# Patient Record
Sex: Female | Born: 1935 | Race: White | Hispanic: No | Smoking: Never smoker
Health system: Southern US, Community
[De-identification: ages and names within clinical notes are randomized; demographics above are authoritative.]

## PROBLEM LIST (undated history)

## (undated) DIAGNOSIS — K5790 Diverticulosis of intestine, part unspecified, without perforation or abscess without bleeding: Secondary | ICD-10-CM

## (undated) DIAGNOSIS — M1990 Unspecified osteoarthritis, unspecified site: Secondary | ICD-10-CM

## (undated) DIAGNOSIS — K219 Gastro-esophageal reflux disease without esophagitis: Secondary | ICD-10-CM

## (undated) DIAGNOSIS — D649 Anemia, unspecified: Secondary | ICD-10-CM

## (undated) DIAGNOSIS — M069 Rheumatoid arthritis, unspecified: Secondary | ICD-10-CM

## (undated) DIAGNOSIS — Z87442 Personal history of urinary calculi: Secondary | ICD-10-CM

## (undated) DIAGNOSIS — L309 Dermatitis, unspecified: Secondary | ICD-10-CM

## (undated) DIAGNOSIS — F0390 Unspecified dementia without behavioral disturbance: Secondary | ICD-10-CM

## (undated) DIAGNOSIS — C801 Malignant (primary) neoplasm, unspecified: Secondary | ICD-10-CM

## (undated) DIAGNOSIS — I10 Essential (primary) hypertension: Secondary | ICD-10-CM

## (undated) DIAGNOSIS — I2510 Atherosclerotic heart disease of native coronary artery without angina pectoris: Secondary | ICD-10-CM

## (undated) HISTORY — PX: OTHER SURGICAL HISTORY: SHX169

## (undated) HISTORY — PX: TOTAL THYROIDECTOMY: SHX2547

## (undated) HISTORY — PX: CHOLECYSTECTOMY: SHX55

## (undated) HISTORY — DX: Unspecified osteoarthritis, unspecified site: M19.90

## (undated) HISTORY — DX: Atherosclerotic heart disease of native coronary artery without angina pectoris: I25.10

## (undated) SURGERY — ERCP, WITH INTERVENTION IF INDICATED
Anesthesia: Monitor Anesthesia Care

---

## 1998-09-17 HISTORY — PX: OTHER SURGICAL HISTORY: SHX169

## 2003-12-07 ENCOUNTER — Ambulatory Visit: Payer: Self-pay | Admitting: Family Medicine

## 2004-12-22 ENCOUNTER — Ambulatory Visit: Payer: Self-pay | Admitting: Family Medicine

## 2005-12-27 ENCOUNTER — Ambulatory Visit: Payer: Self-pay | Admitting: Family Medicine

## 2007-02-06 ENCOUNTER — Ambulatory Visit: Payer: Self-pay | Admitting: Family Medicine

## 2007-08-06 ENCOUNTER — Ambulatory Visit: Payer: Self-pay | Admitting: Rheumatology

## 2009-06-11 ENCOUNTER — Ambulatory Visit: Payer: Self-pay | Admitting: Internal Medicine

## 2009-06-16 ENCOUNTER — Ambulatory Visit: Payer: Self-pay | Admitting: Internal Medicine

## 2009-07-19 ENCOUNTER — Ambulatory Visit: Payer: Self-pay | Admitting: Gastroenterology

## 2010-05-19 ENCOUNTER — Ambulatory Visit: Payer: Self-pay | Admitting: Surgery

## 2010-05-19 ENCOUNTER — Ambulatory Visit: Payer: Self-pay | Admitting: Physician Assistant

## 2010-05-20 ENCOUNTER — Ambulatory Visit: Payer: Self-pay | Admitting: Surgery

## 2010-08-18 ENCOUNTER — Ambulatory Visit: Payer: Self-pay | Admitting: Internal Medicine

## 2011-03-08 ENCOUNTER — Ambulatory Visit: Payer: Self-pay | Admitting: Otolaryngology

## 2011-03-08 LAB — BASIC METABOLIC PANEL
Anion Gap: 8 (ref 7–16)
BUN: 18 mg/dL (ref 7–18)
EGFR (Non-African Amer.): >60
Glucose: 93 mg/dL (ref 65–99)
Osmolality: 283 (ref 275–301)
Potassium: 3.3 mmol/L — ABNORMAL LOW (ref 3.5–5.1)
Sodium: 141 mmol/L (ref 136–145)

## 2011-03-08 LAB — CBC WITH DIFFERENTIAL/PLATELET
Basophil #: 0 10*3/uL (ref 0.0–0.1)
Basophil %: 0.3 %
Eosinophil %: 0.5 %
HCT: 37.6 % (ref 35.0–47.0)
HGB: 12.5 g/dL (ref 12.0–16.0)
Lymphocyte %: 34.3 %
MCH: 30.7 pg (ref 26.0–34.0)
MCHC: 33.2 g/dL (ref 32.0–36.0)
MCV: 93 fL (ref 80–100)
Monocyte #: 0.5 10*3/uL (ref 0.0–0.7)
Monocyte %: 7.3 %
Neutrophil %: 57.6 %
Platelet: 200 10*3/uL (ref 150–440)
RBC: 4.06 10*6/uL (ref 3.80–5.20)
WBC: 6.8 10*3/uL (ref 3.6–11.0)

## 2011-03-08 LAB — CREATININE, SERUM
EGFR (African American): >60
EGFR (Non-African Amer.): >60

## 2011-03-09 ENCOUNTER — Ambulatory Visit: Payer: Self-pay | Admitting: Oncology

## 2011-03-13 ENCOUNTER — Ambulatory Visit: Payer: Self-pay | Admitting: Oncology

## 2011-03-16 ENCOUNTER — Inpatient Hospital Stay: Payer: Self-pay | Admitting: Otolaryngology

## 2011-03-16 LAB — POTASSIUM: Potassium: 3.9 mmol/L (ref 3.5–5.1)

## 2011-03-17 LAB — BASIC METABOLIC PANEL
Anion Gap: 13 (ref 7–16)
BUN: 15 mg/dL (ref 7–18)
Calcium, Total: 8.1 mg/dL — ABNORMAL LOW (ref 8.5–10.1)
EGFR (African American): >60
EGFR (Non-African Amer.): >60
Glucose: 210 mg/dL — ABNORMAL HIGH (ref 65–99)
Osmolality: 294 (ref 275–301)
Potassium: 3.7 mmol/L (ref 3.5–5.1)
Sodium: 144 mmol/L (ref 136–145)

## 2011-03-17 LAB — CBC WITH DIFFERENTIAL/PLATELET
Basophil #: 0 10*3/uL (ref 0.0–0.1)
Eosinophil #: 0 10*3/uL (ref 0.0–0.7)
Lymphocyte #: 0.5 10*3/uL — ABNORMAL LOW (ref 1.0–3.6)
Lymphocyte %: 4.3 %
MCHC: 33.5 g/dL (ref 32.0–36.0)
MCV: 93 fL (ref 80–100)
Monocyte #: 0.3 10*3/uL (ref 0.0–0.7)
Monocyte %: 2.7 %
Platelet: 152 10*3/uL (ref 150–440)
RDW: 13 % (ref 11.5–14.5)

## 2011-03-18 LAB — BASIC METABOLIC PANEL
BUN: 12 mg/dL (ref 7–18)
Calcium, Total: 8.5 mg/dL (ref 8.5–10.1)
Chloride: 107 mmol/L (ref 98–107)
Creatinine: 0.78 mg/dL (ref 0.60–1.30)
EGFR (African American): >60
EGFR (Non-African Amer.): >60
Glucose: 137 mg/dL — ABNORMAL HIGH (ref 65–99)
Osmolality: 287 (ref 275–301)
Sodium: 143 mmol/L (ref 136–145)

## 2011-03-24 ENCOUNTER — Ambulatory Visit: Payer: Self-pay | Admitting: Oncology

## 2011-04-24 ENCOUNTER — Ambulatory Visit: Payer: Self-pay | Admitting: Oncology

## 2011-04-26 LAB — CBC CANCER CENTER
Eosinophil #: 0.1 x10 3/mm (ref 0.0–0.7)
Eosinophil %: 1.3 %
HCT: 37 % (ref 35.0–47.0)
HGB: 12.6 g/dL (ref 12.0–16.0)
Lymphocyte #: 1.5 x10 3/mm (ref 1.0–3.6)
Lymphocyte %: 19.3 %
MCH: 31 pg (ref 26.0–34.0)
MCHC: 33.9 g/dL (ref 32.0–36.0)
MCV: 91 fL (ref 80–100)
Monocyte #: 0.7 x10 3/mm (ref 0.0–0.7)
Monocyte %: 8.8 %
Neutrophil #: 5.5 x10 3/mm (ref 1.4–6.5)
Neutrophil %: 70.3 %
RBC: 4.06 10*6/uL (ref 3.80–5.20)
RDW: 13.2 % (ref 11.5–14.5)
WBC: 7.8 x10 3/mm (ref 3.6–11.0)

## 2011-05-03 LAB — CBC CANCER CENTER
Basophil #: 0 x10 3/mm (ref 0.0–0.1)
Eosinophil #: 0.1 x10 3/mm (ref 0.0–0.7)
Eosinophil %: 1.7 %
HCT: 36.4 % (ref 35.0–47.0)
Lymphocyte #: 1.1 x10 3/mm (ref 1.0–3.6)
Lymphocyte %: 17.8 %
MCH: 30.8 pg (ref 26.0–34.0)
MCHC: 33.8 g/dL (ref 32.0–36.0)
Monocyte #: 0.6 x10 3/mm (ref 0.2–0.9)
Monocyte %: 10.7 %
Neutrophil %: 69.7 %
RDW: 13.1 % (ref 11.5–14.5)

## 2011-05-10 LAB — CBC CANCER CENTER
Basophil %: 0.7 %
Eosinophil %: 2 %
HCT: 37.7 % (ref 35.0–47.0)
Lymphocyte %: 18 %
MCH: 29.8 pg (ref 26.0–34.0)
Neutrophil %: 71 %
RDW: 12.5 % (ref 11.5–14.5)
WBC: 5.3 x10 3/mm (ref 3.6–11.0)

## 2011-05-17 LAB — CBC CANCER CENTER
Basophil #: 0 x10 3/mm (ref 0.0–0.1)
Basophil %: 0.6 %
Eosinophil %: 2.1 %
HCT: 36.4 % (ref 35.0–47.0)
HGB: 11.9 g/dL — ABNORMAL LOW (ref 12.0–16.0)
Lymphocyte %: 16.9 %
MCH: 29.6 pg (ref 26.0–34.0)
MCHC: 32.7 g/dL (ref 32.0–36.0)
MCV: 90 fL (ref 80–100)
Monocyte #: 0.6 x10 3/mm (ref 0.2–0.9)
Monocyte %: 12 %
Neutrophil #: 3.2 x10 3/mm (ref 1.4–6.5)
Neutrophil %: 68.4 %
Platelet: 186 x10 3/mm (ref 150–440)
RBC: 4.03 10*6/uL (ref 3.80–5.20)
RDW: 12.7 % (ref 11.5–14.5)

## 2011-05-22 LAB — PATHOLOGY REPORT

## 2011-05-24 ENCOUNTER — Ambulatory Visit: Payer: Self-pay | Admitting: Oncology

## 2011-05-24 LAB — CBC CANCER CENTER
Basophil #: 0 x10 3/mm (ref 0.0–0.1)
Basophil %: 0.4 %
Eosinophil #: 0.1 x10 3/mm (ref 0.0–0.7)
HCT: 36.6 % (ref 35.0–47.0)
Lymphocyte #: 0.7 x10 3/mm — ABNORMAL LOW (ref 1.0–3.6)
MCHC: 32.8 g/dL (ref 32.0–36.0)
Monocyte %: 10.3 %
Platelet: 169 x10 3/mm (ref 150–440)
RBC: 4.06 10*6/uL (ref 3.80–5.20)
RDW: 12.4 % (ref 11.5–14.5)
WBC: 5.7 x10 3/mm (ref 3.6–11.0)

## 2011-05-31 LAB — CBC CANCER CENTER
Basophil %: 0.6 %
Eosinophil #: 0.2 x10 3/mm (ref 0.0–0.7)
Eosinophil %: 3.2 %
HGB: 11.7 g/dL — ABNORMAL LOW (ref 12.0–16.0)
Lymphocyte #: 0.7 x10 3/mm — ABNORMAL LOW (ref 1.0–3.6)
MCH: 29.7 pg (ref 26.0–34.0)
Monocyte #: 0.5 x10 3/mm (ref 0.2–0.9)
Monocyte %: 10.7 %
Neutrophil #: 3.7 x10 3/mm (ref 1.4–6.5)
Neutrophil %: 72.7 %
RBC: 3.95 10*6/uL (ref 3.80–5.20)
WBC: 5.1 x10 3/mm (ref 3.6–11.0)

## 2011-06-07 LAB — CBC CANCER CENTER
Basophil #: 0 x10 3/mm (ref 0.0–0.1)
Basophil %: 0.2 %
Eosinophil #: 0.1 x10 3/mm (ref 0.0–0.7)
Eosinophil %: 1.1 %
HGB: 11.9 g/dL — ABNORMAL LOW (ref 12.0–16.0)
Lymphocyte #: 0.6 x10 3/mm — ABNORMAL LOW (ref 1.0–3.6)
MCH: 29.3 pg (ref 26.0–34.0)
MCV: 90 fL (ref 80–100)
Monocyte #: 0.7 x10 3/mm (ref 0.2–0.9)
Neutrophil #: 5.9 x10 3/mm (ref 1.4–6.5)
Neutrophil %: 80.9 %
Platelet: 198 x10 3/mm (ref 150–440)
RDW: 12.8 % (ref 11.5–14.5)

## 2011-06-15 ENCOUNTER — Ambulatory Visit: Payer: Self-pay | Admitting: Otolaryngology

## 2011-06-15 DIAGNOSIS — I10 Essential (primary) hypertension: Secondary | ICD-10-CM

## 2011-06-15 LAB — POTASSIUM: Potassium: 3.7 mmol/L (ref 3.5–5.1)

## 2011-06-22 ENCOUNTER — Ambulatory Visit: Payer: Self-pay | Admitting: Otolaryngology

## 2011-06-23 LAB — CALCIUM: Calcium, Total: 8.2 mg/dL — ABNORMAL LOW (ref 8.5–10.1)

## 2011-06-24 ENCOUNTER — Ambulatory Visit: Payer: Self-pay | Admitting: Oncology

## 2011-06-26 LAB — PATHOLOGY REPORT

## 2011-07-03 ENCOUNTER — Encounter: Payer: Self-pay | Admitting: Otolaryngology

## 2011-07-10 ENCOUNTER — Ambulatory Visit: Payer: Self-pay | Admitting: Oncology

## 2011-07-10 LAB — COMPREHENSIVE METABOLIC PANEL
Albumin: 3.5 g/dL (ref 3.4–5.0)
Alkaline Phosphatase: 109 U/L (ref 50–136)
Anion Gap: 7 (ref 7–16)
BUN: 20 mg/dL — ABNORMAL HIGH (ref 7–18)
Bilirubin,Total: 0.7 mg/dL (ref 0.2–1.0)
Co2: 31 mmol/L (ref 21–32)
Creatinine: 0.95 mg/dL (ref 0.60–1.30)
EGFR (African American): >60
Osmolality: 282 (ref 275–301)
Potassium: 3.8 mmol/L (ref 3.5–5.1)
SGOT(AST): 25 U/L (ref 15–37)
Sodium: 140 mmol/L (ref 136–145)
Total Protein: 7.9 g/dL (ref 6.4–8.2)

## 2011-07-10 LAB — CBC CANCER CENTER
Eosinophil #: 0.4 x10 3/mm (ref 0.0–0.7)
Eosinophil %: 6.8 %
HCT: 39.1 % (ref 35.0–47.0)
HGB: 12.8 g/dL (ref 12.0–16.0)
MCHC: 32.8 g/dL (ref 32.0–36.0)
MCV: 88 fL (ref 80–100)
Monocyte #: 0.4 x10 3/mm (ref 0.2–0.9)
Neutrophil #: 4 x10 3/mm (ref 1.4–6.5)
Neutrophil %: 76 %
RBC: 4.42 10*6/uL (ref 3.80–5.20)

## 2011-07-24 ENCOUNTER — Ambulatory Visit: Payer: Self-pay | Admitting: Oncology

## 2011-08-02 ENCOUNTER — Encounter: Payer: Self-pay | Admitting: Otolaryngology

## 2011-08-17 ENCOUNTER — Ambulatory Visit: Payer: Self-pay | Admitting: Oncology

## 2011-08-17 LAB — TSH: Thyroid Stimulating Horm: 12.7 u[IU]/mL — ABNORMAL HIGH

## 2011-08-24 ENCOUNTER — Ambulatory Visit: Payer: Self-pay | Admitting: Oncology

## 2011-09-05 ENCOUNTER — Ambulatory Visit: Payer: Self-pay | Admitting: Internal Medicine

## 2011-09-12 LAB — T4, FREE: Free Thyroxine: 1.94 ng/dL — ABNORMAL HIGH (ref 0.76–1.46)

## 2011-09-12 LAB — TSH: Thyroid Stimulating Horm: 0.042 u[IU]/mL — ABNORMAL LOW

## 2011-09-24 ENCOUNTER — Ambulatory Visit: Payer: Self-pay | Admitting: Oncology

## 2011-10-09 LAB — TSH: Thyroid Stimulating Horm: 0.303 u[IU]/mL — ABNORMAL LOW

## 2011-10-09 LAB — T4, FREE: Free Thyroxine: 0.91 ng/dL (ref 0.76–1.46)

## 2011-10-13 LAB — CBC CANCER CENTER
Basophil %: 0.4 %
Eosinophil #: 0.1 x10 3/mm (ref 0.0–0.7)
Eosinophil %: 2.3 %
HCT: 37.4 % (ref 35.0–47.0)
HGB: 12.6 g/dL (ref 12.0–16.0)
Lymphocyte #: 0.7 x10 3/mm — ABNORMAL LOW (ref 1.0–3.6)
Lymphocyte %: 10.2 %
MCH: 30.4 pg (ref 26.0–34.0)
MCHC: 33.7 g/dL (ref 32.0–36.0)
MCV: 90 fL (ref 80–100)
Monocyte #: 0.6 x10 3/mm (ref 0.2–0.9)
Neutrophil %: 77.7 %
RBC: 4.14 10*6/uL (ref 3.80–5.20)
WBC: 6.5 x10 3/mm (ref 3.6–11.0)

## 2011-10-24 ENCOUNTER — Ambulatory Visit: Payer: Self-pay | Admitting: Oncology

## 2011-11-24 ENCOUNTER — Ambulatory Visit: Payer: Self-pay | Admitting: Oncology

## 2011-12-24 ENCOUNTER — Ambulatory Visit: Payer: Self-pay | Admitting: Oncology

## 2012-02-24 ENCOUNTER — Ambulatory Visit: Payer: Self-pay | Admitting: Oncology

## 2012-03-11 LAB — TSH: Thyroid Stimulating Horm: 0.033 u[IU]/mL — ABNORMAL LOW

## 2012-03-23 ENCOUNTER — Ambulatory Visit: Payer: Self-pay | Admitting: Oncology

## 2012-04-23 ENCOUNTER — Ambulatory Visit: Payer: Self-pay | Admitting: Oncology

## 2012-05-22 LAB — CREATININE, SERUM
EGFR (African American): >60
EGFR (Non-African Amer.): >60

## 2012-05-23 ENCOUNTER — Ambulatory Visit: Payer: Self-pay | Admitting: Oncology

## 2012-06-03 LAB — TSH: Thyroid Stimulating Horm: 0.026 u[IU]/mL — ABNORMAL LOW

## 2012-06-23 ENCOUNTER — Ambulatory Visit: Payer: Self-pay | Admitting: Oncology

## 2012-08-24 IMAGING — CT CT SIM MISC
1 series · 12 of 14 positions shown, 15 images · non-contrast
Comparison: none

[Series 3: — · axial · 1.16mm/px · z∈[-770,-398]mm · 12 of 148 slices shown, 15 images]
[im 12/148  soft-tissue]
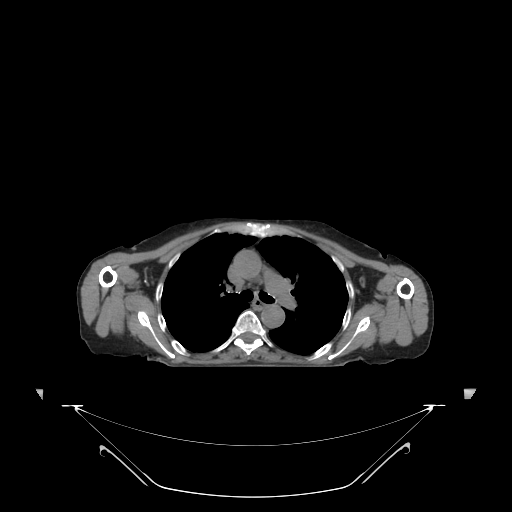
[im 12/148  bone]
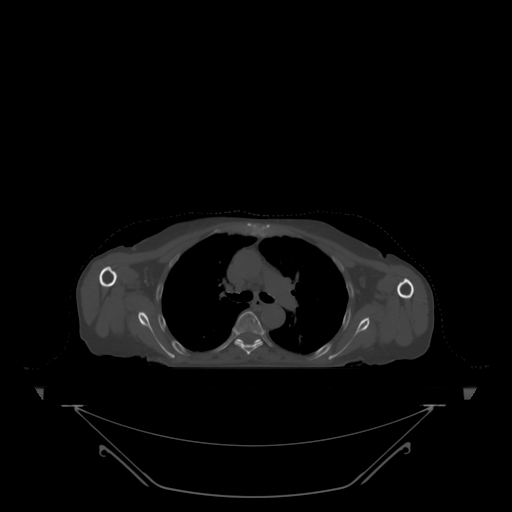
[im 23/148  bone]
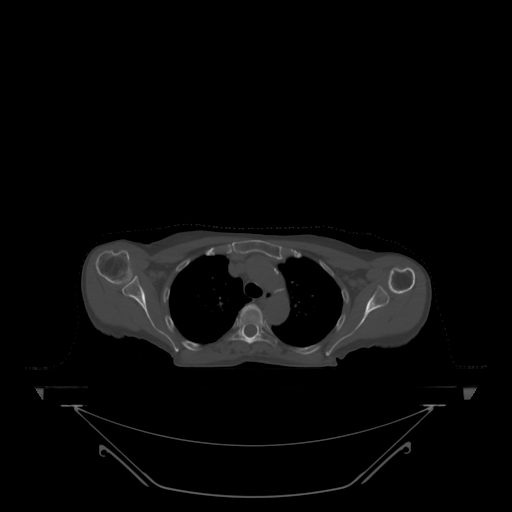
[im 34/148  bone]
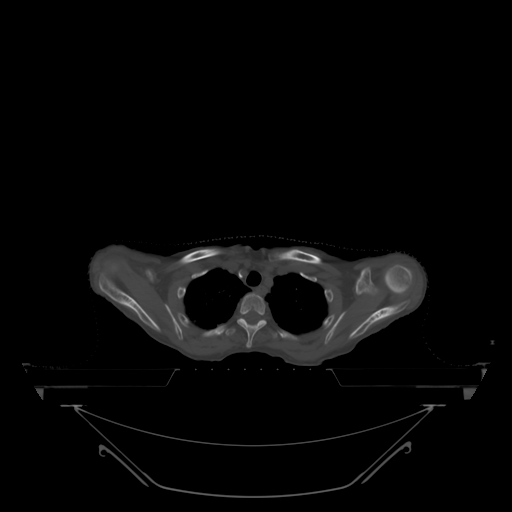
[im 46/148  bone]
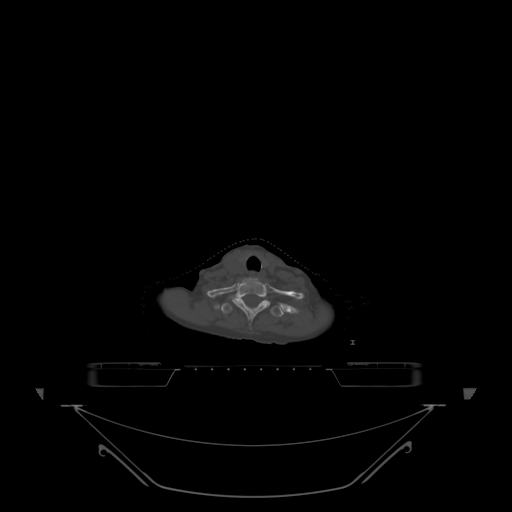
[im 57/148  soft-tissue]
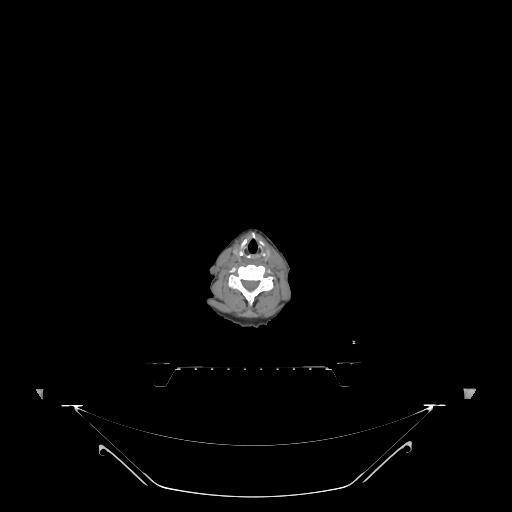
[im 57/148  bone]
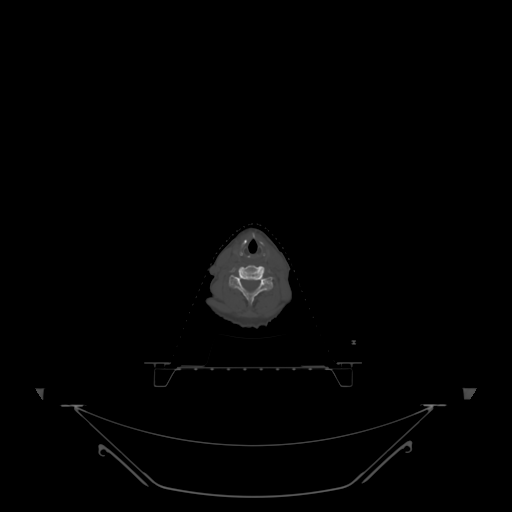
[im 68/148  bone]
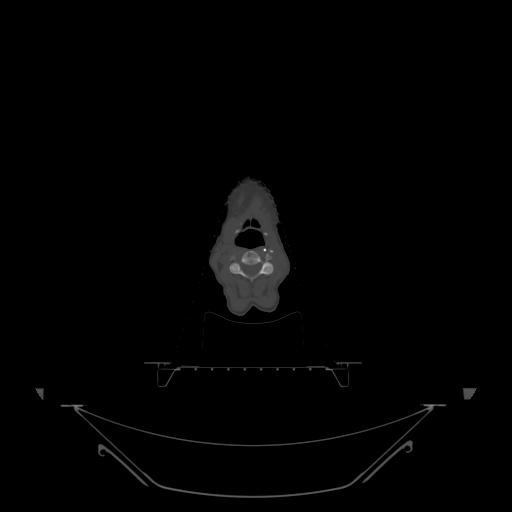
[im 80/148  bone]
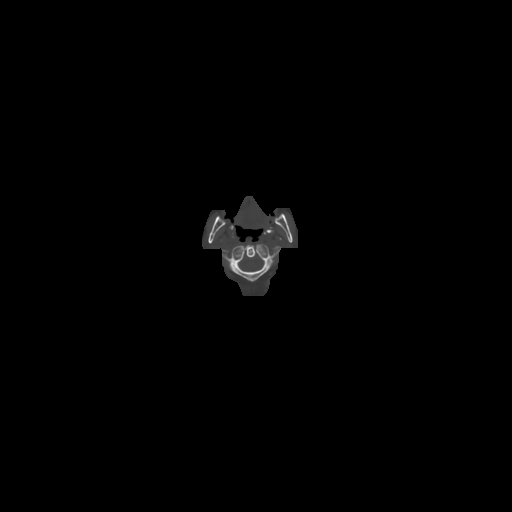
[im 91/148  bone]
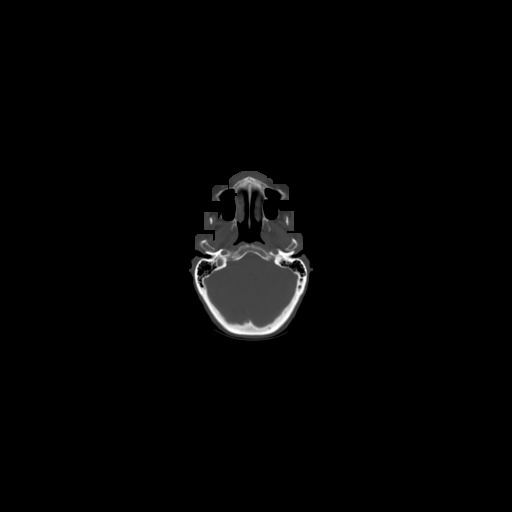
[im 102/148  soft-tissue]
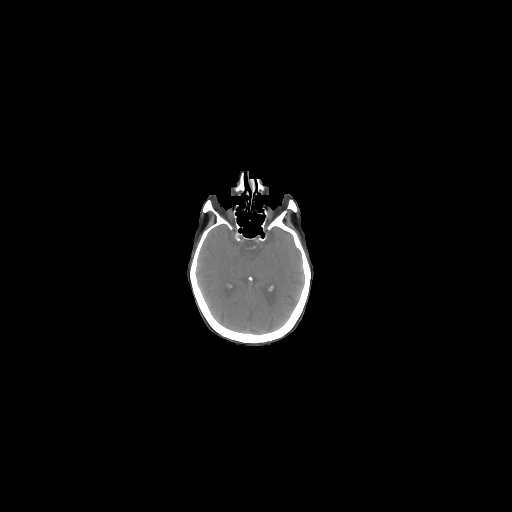
[im 102/148  bone]
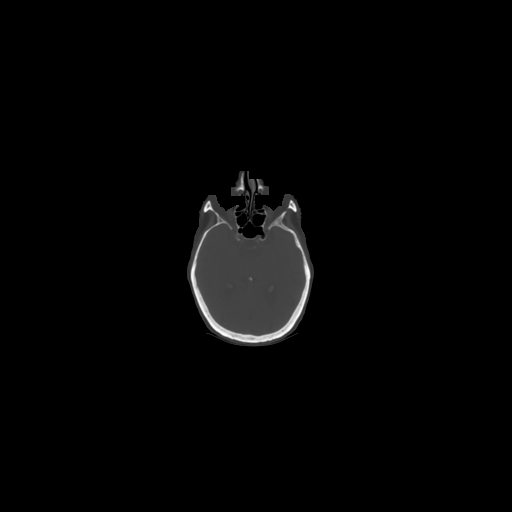
[im 114/148  bone]
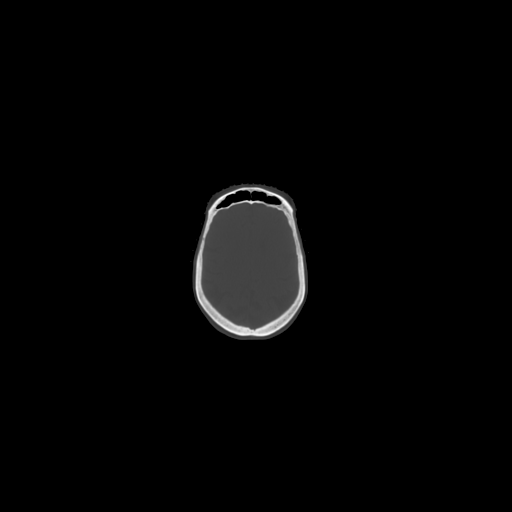
[im 125/148  bone]
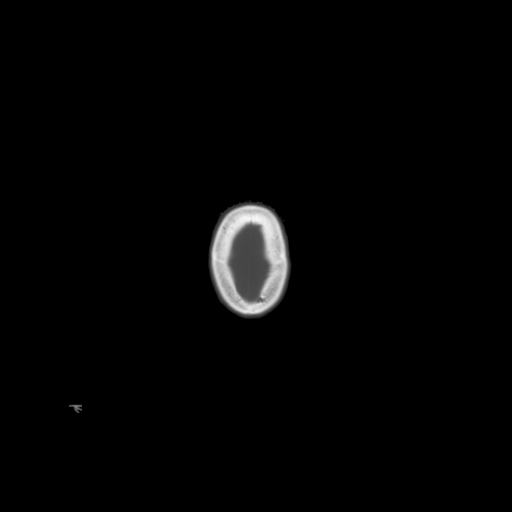
[im 136/148  bone]
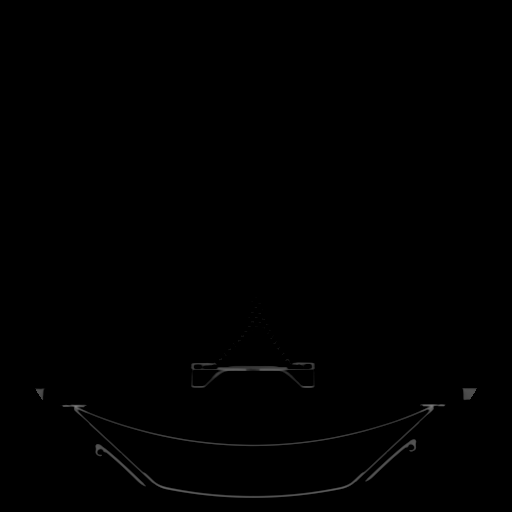

[12 of 14 positions shown; findings below may reference images not displayed]

IMAGES IMPORTED FROM THE [employer] WORKFLOW SYSTEM
NO DICTATION FOR STUDY

## 2012-08-30 ENCOUNTER — Ambulatory Visit: Payer: Self-pay | Admitting: Oncology

## 2012-09-02 LAB — TSH: Thyroid Stimulating Horm: 0.056 u[IU]/mL — ABNORMAL LOW

## 2012-09-25 ENCOUNTER — Ambulatory Visit: Payer: Self-pay | Admitting: Internal Medicine

## 2012-09-26 ENCOUNTER — Ambulatory Visit: Payer: Self-pay | Admitting: Oncology

## 2012-10-04 ENCOUNTER — Ambulatory Visit: Payer: Self-pay | Admitting: Internal Medicine

## 2012-10-09 ENCOUNTER — Ambulatory Visit: Payer: Self-pay | Admitting: Oncology

## 2012-10-23 ENCOUNTER — Ambulatory Visit: Payer: Self-pay | Admitting: Oncology

## 2012-12-02 ENCOUNTER — Ambulatory Visit: Payer: Self-pay | Admitting: Oncology

## 2012-12-02 LAB — CREATININE, SERUM
Creatinine: 0.93 mg/dL (ref 0.60–1.30)
EGFR (African American): >60
EGFR (Non-African Amer.): 57 — ABNORMAL LOW

## 2012-12-02 LAB — TSH: Thyroid Stimulating Horm: 0.146 u[IU]/mL — ABNORMAL LOW

## 2012-12-23 ENCOUNTER — Ambulatory Visit: Payer: Self-pay | Admitting: Oncology

## 2013-02-23 IMAGING — US US EXTREM UP VENOUS*R*
1 series · 14 of 24 positions shown · non-contrast
Comparison: none

REASON FOR EXAM: call report [id number] Eval for DVT  swelling pain
COMMENTS:

[Series 1: us extrem up venous*right* · 0.07mm/px · 14 of 49 slices shown]
[im 1/49]
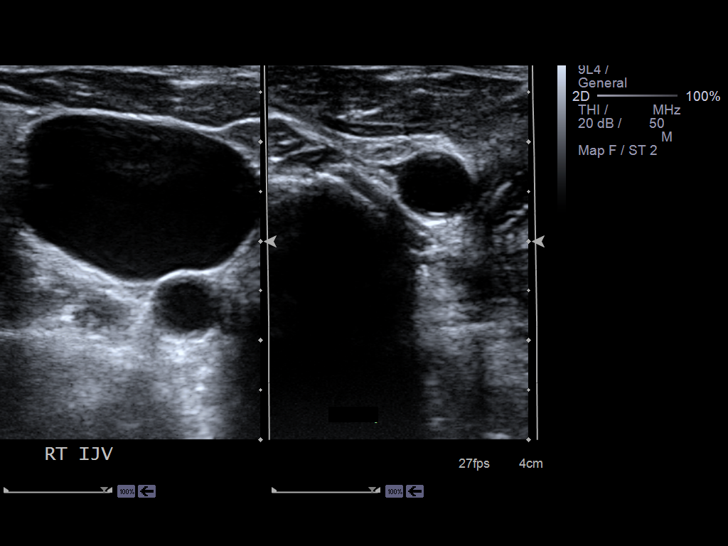
[im 5/49]
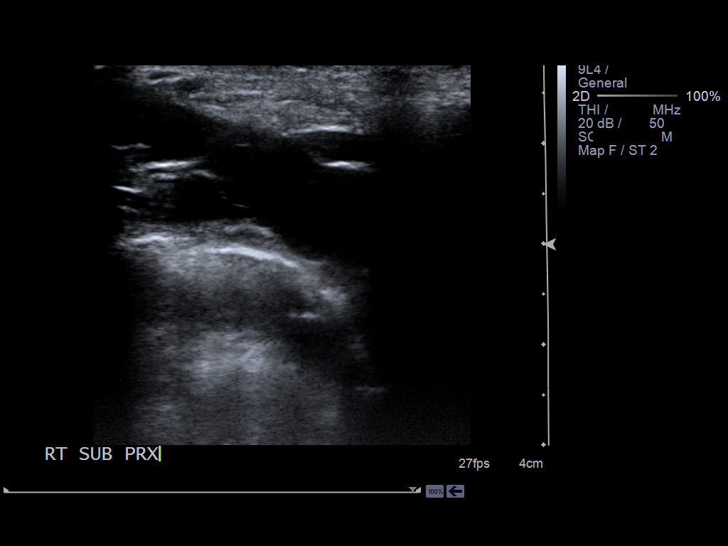
[im 9/49]
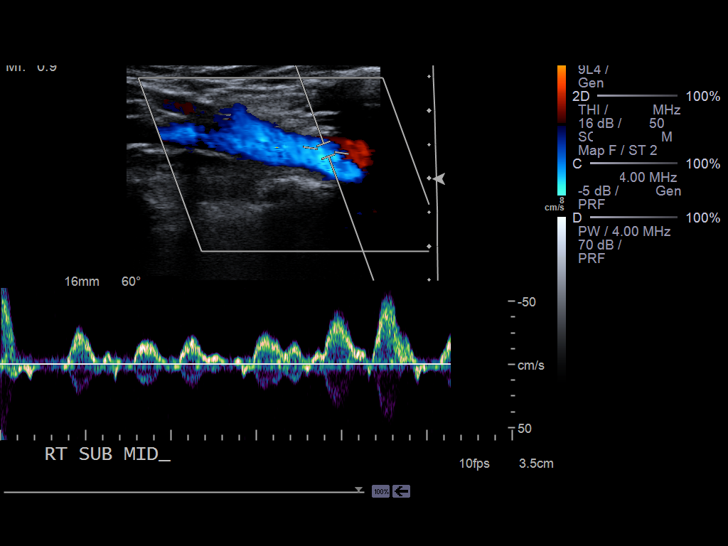
[im 13/49]
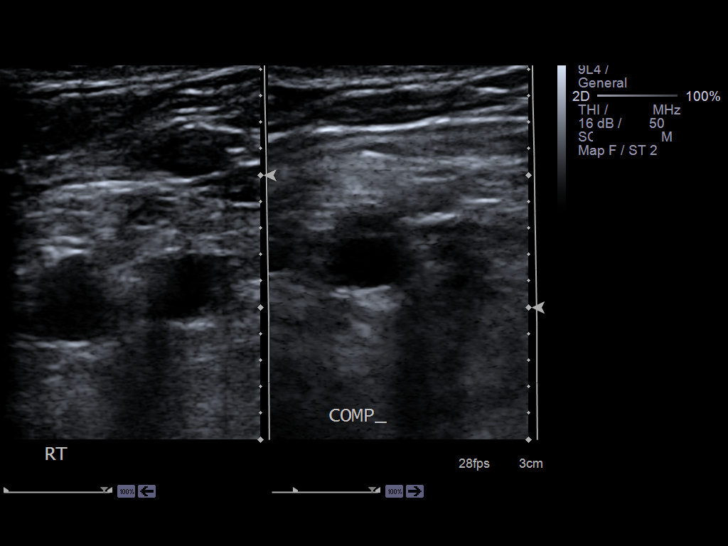
[im 15/49]
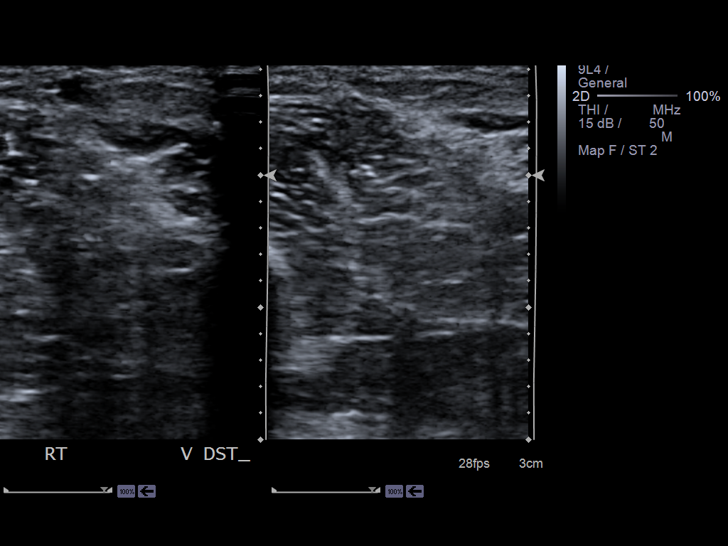
[im 19/49]
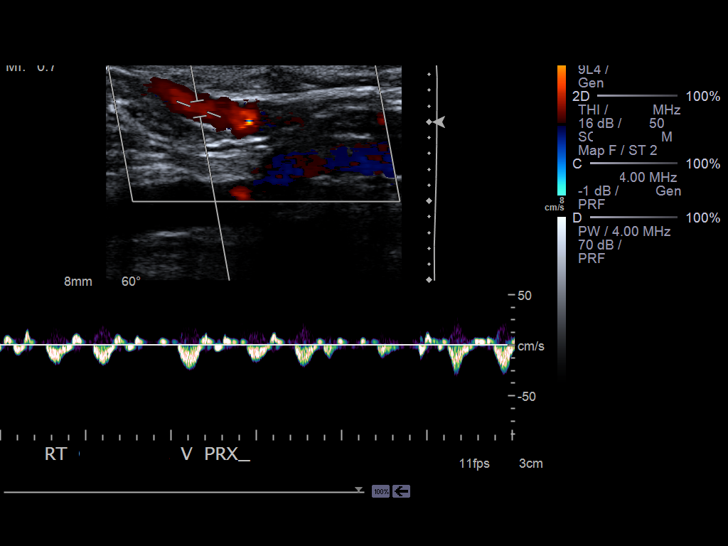
[im 23/49]
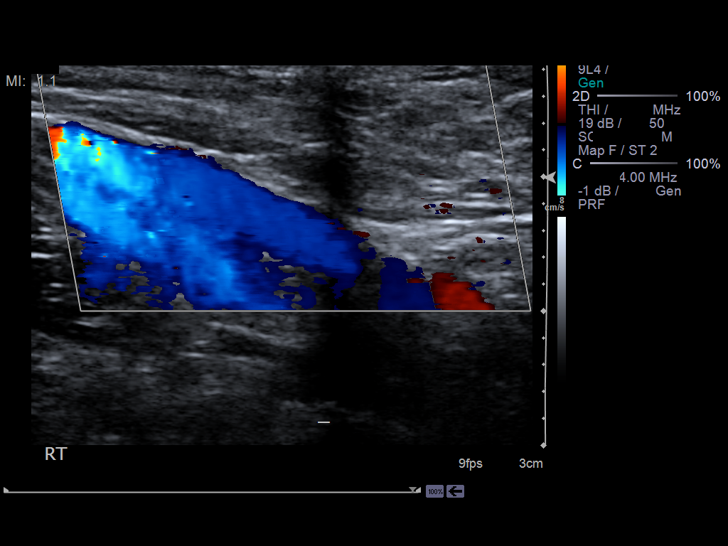
[im 26/49]
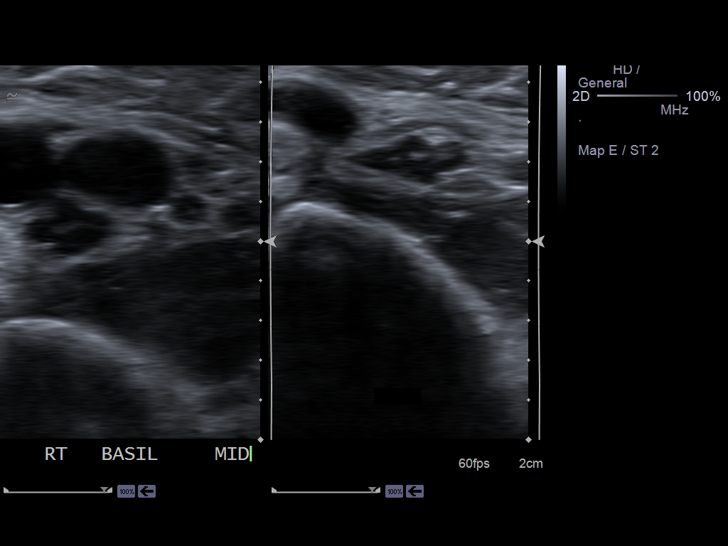
[im 30/49]
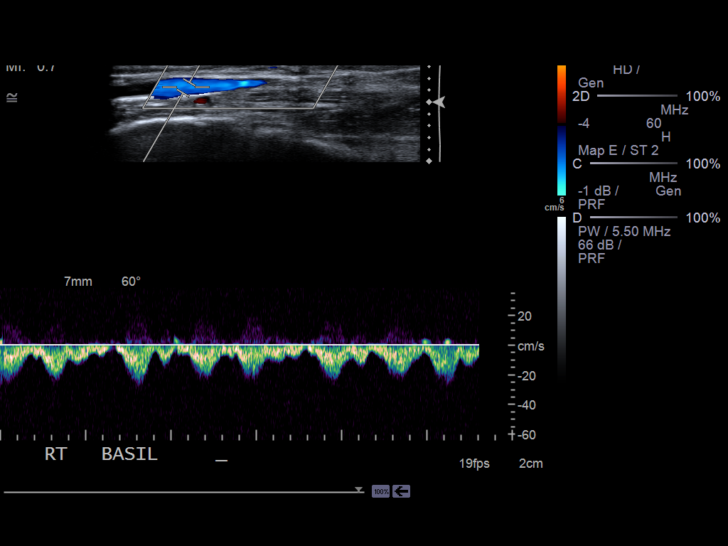
[im 34/49]
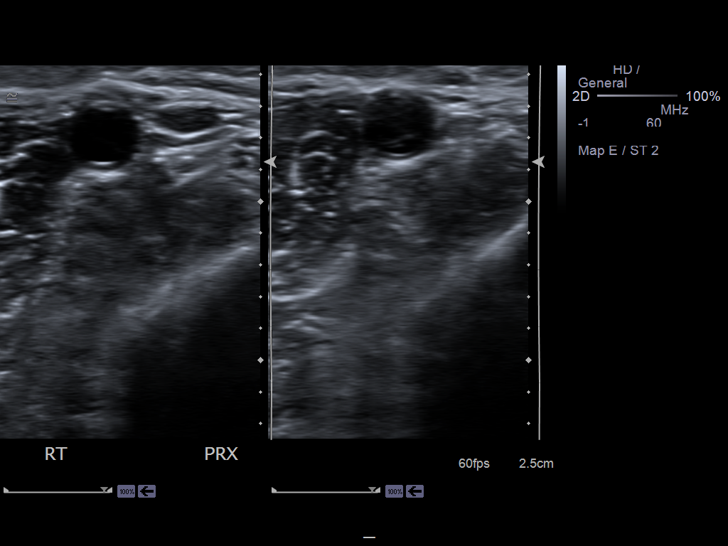
[im 38/49]
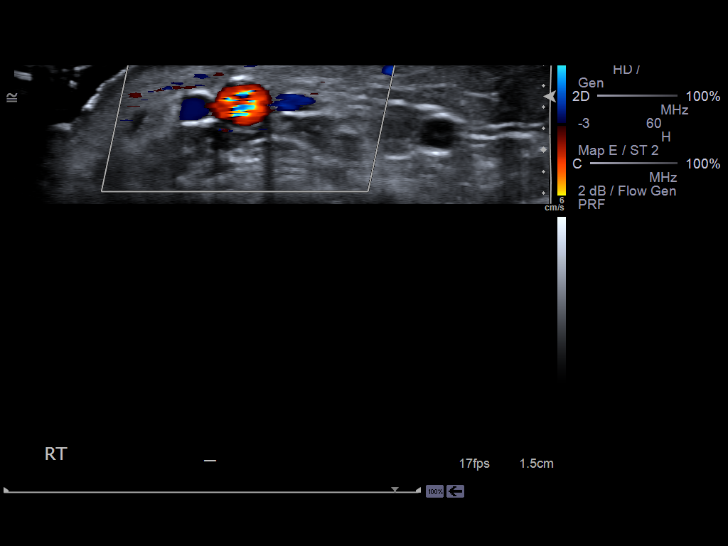
[im 40/49]
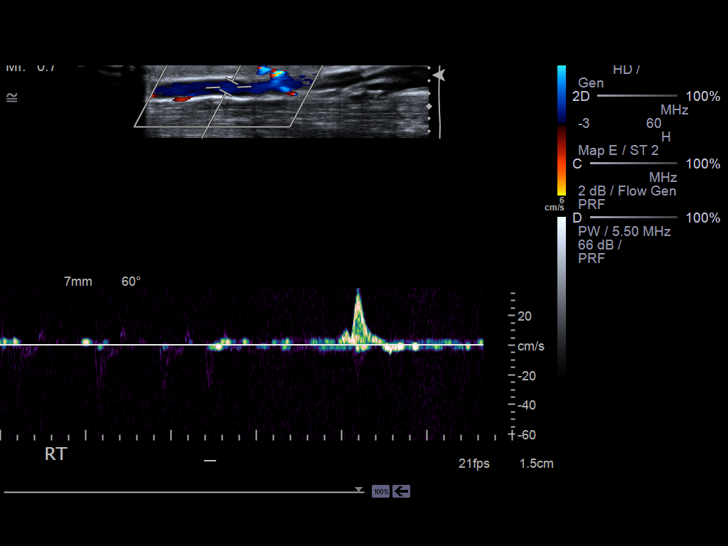
[im 44/49]
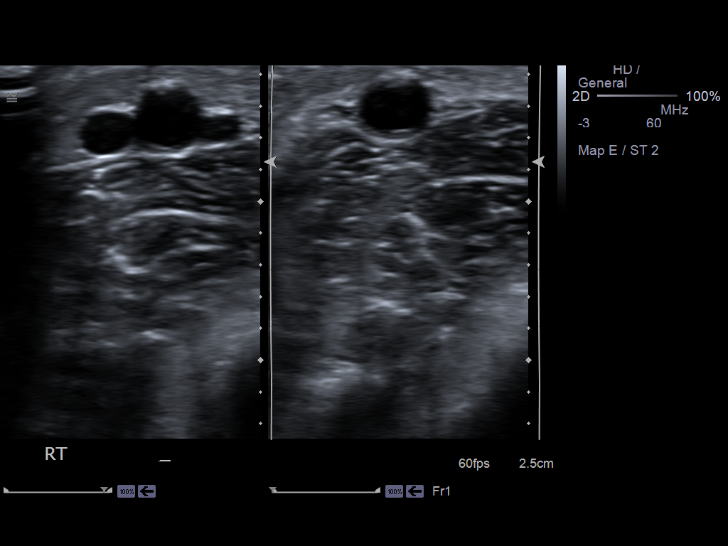
[im 49/49]
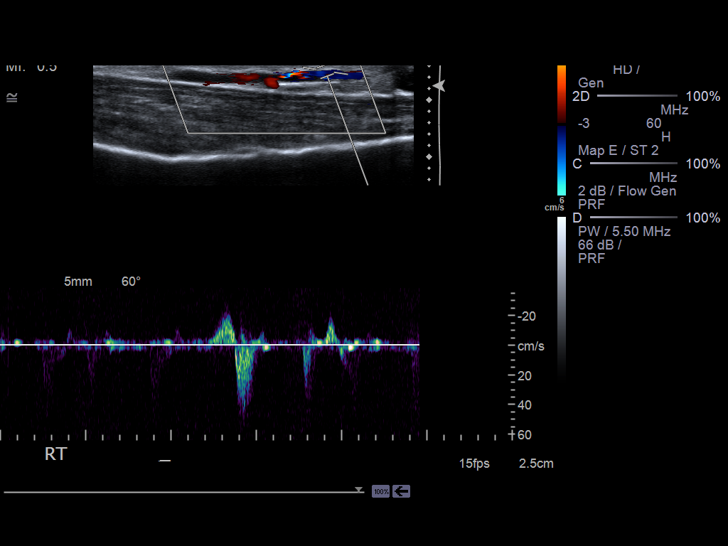

[14 of 24 positions shown; findings below may reference images not displayed]

PROCEDURE:     US  - US DOPPLER UP EXTR RIGHT  - October 13, 2011  [DATE]

RESULT:     Duplex ultrasound was performed of the deep venous system of the
right upper extremity. The vessels from the antecubital vein to the
subclavian vein are normally compressible and the waveform patterns are
normal. No abnormal intraluminal echoes are demonstrated. The jugular vein
where visualized appears normal.
IMPRESSION: There is no evidence of deep venous thrombosis in the veins
of the right upper extremity.

[REDACTED]

## 2013-04-04 ENCOUNTER — Ambulatory Visit: Payer: Self-pay | Admitting: Internal Medicine

## 2013-05-02 IMAGING — CT CT NECK WITH CONTRAST
2 series · 10 of 14 positions shown, 12 images · IV contrast (agent unspecified)
Comparison: none

REASON FOR EXAM: LABS 1ST restaging head neck CA
COMMENTS:

PROCEDURE:     KCT - KCT NECK WITH CONTRAST  - December 20, 2011 [DATE]
RESULT:
TECHNIQUE: CT of the neck is performed with 75 ml of Ksovue-YOO iodinated
intravenous contrast with images reconstructed at 3.0 mm slice thickness in
the axial plane.
Comparison is made to images from a study dated 08 March, 2011.

[Series 2: neck 3.0 3 · axial · 0.44mm/px · z∈[-350,-101]mm · 8 of 106 slices shown, 10 images]
[im 12/106  soft-tissue]
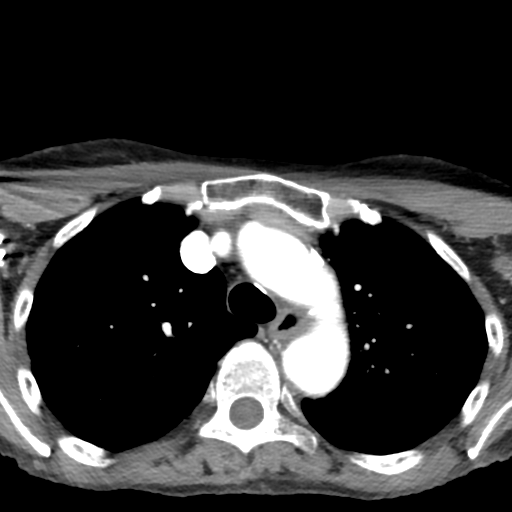
[im 12/106  bone]
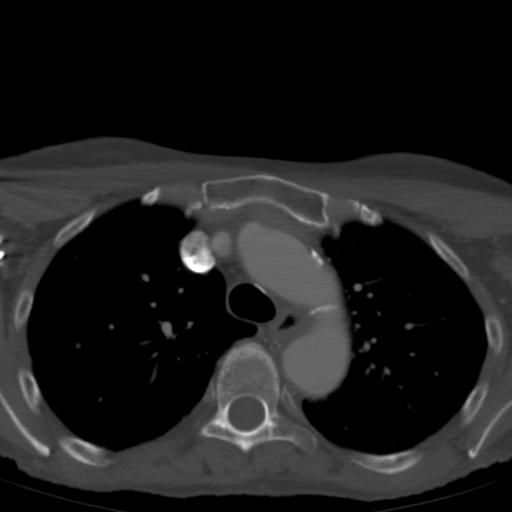
[im 24/106  bone]
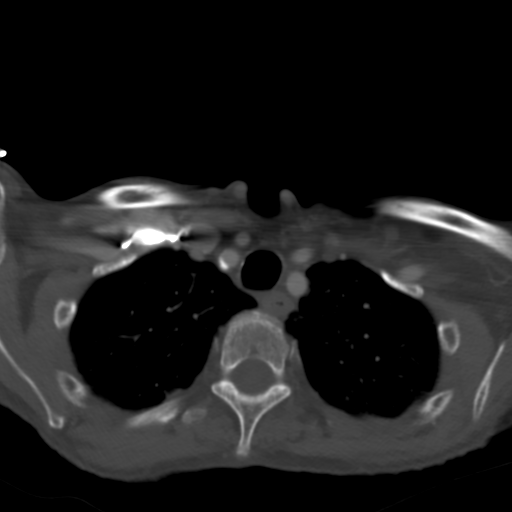
[im 36/106  bone]
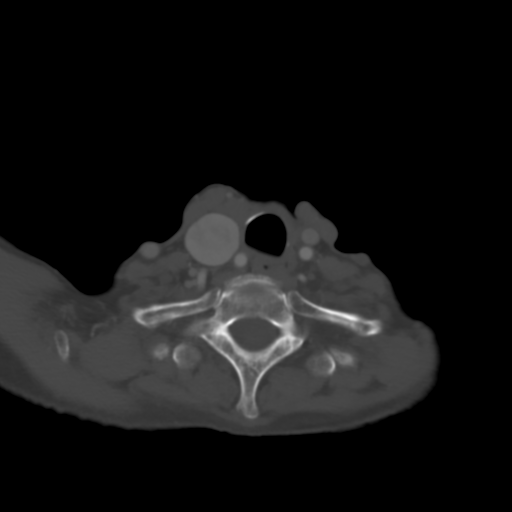
[im 47/106  bone]
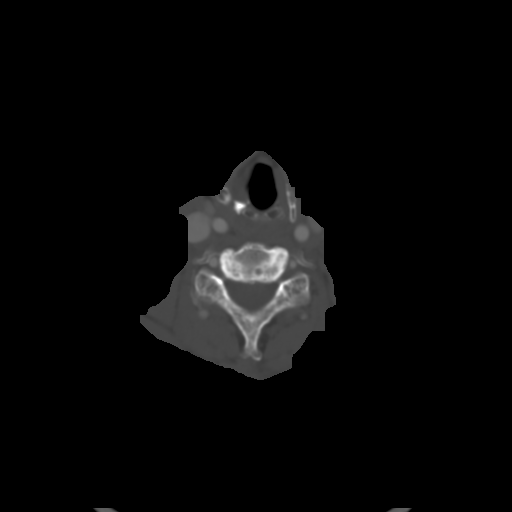
[im 59/106  soft-tissue]
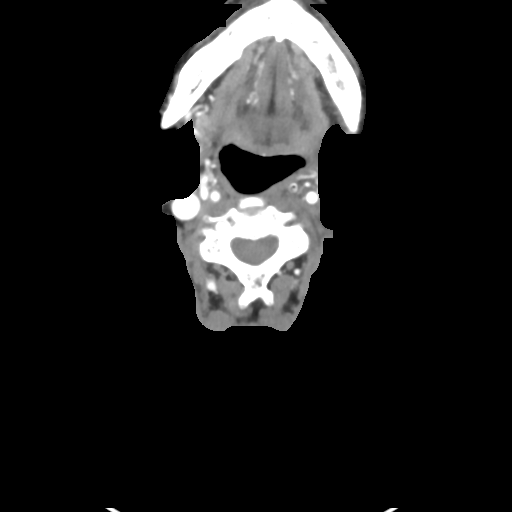
[im 59/106  bone]
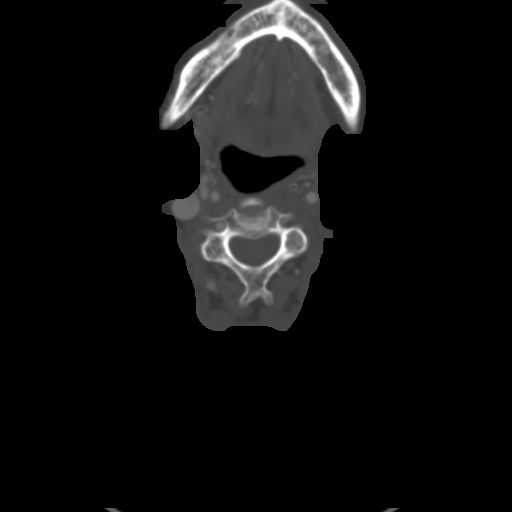
[im 71/106  bone]
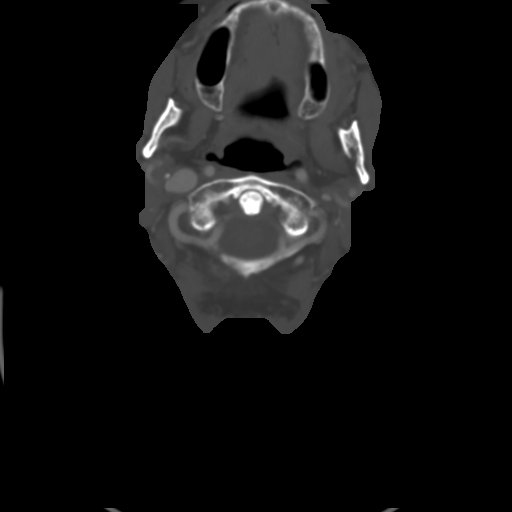
[im 82/106  bone]
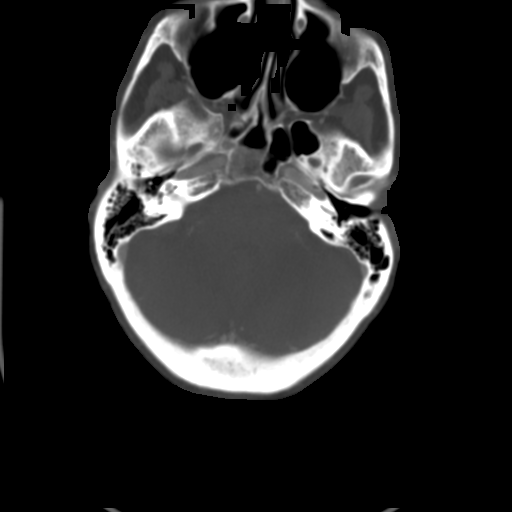
[im 94/106  bone]
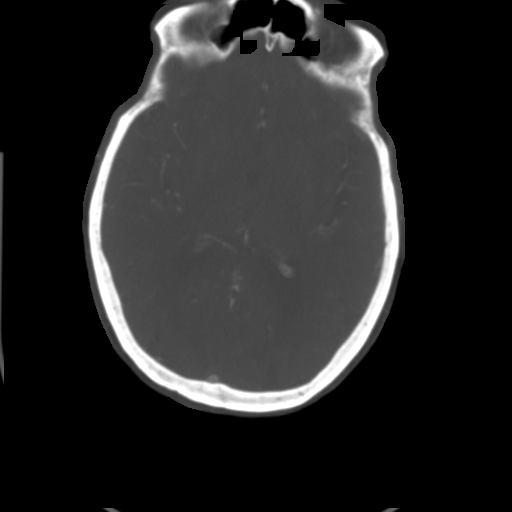

[Series 4: lung · axial · 0.53mm/px · z∈[-344,-305]mm · 2 of 40 slices shown]
[im 14/40  bone]
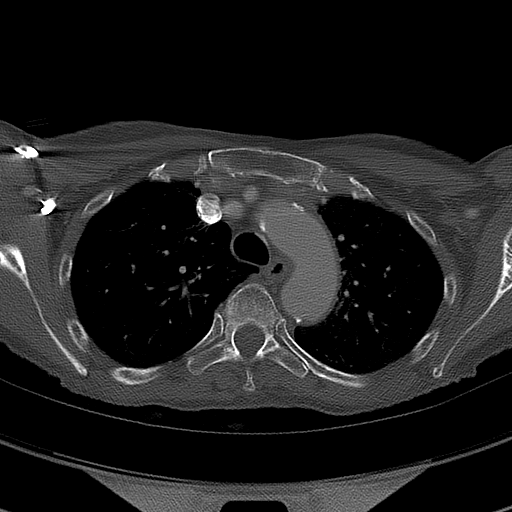
[im 27/40  bone]
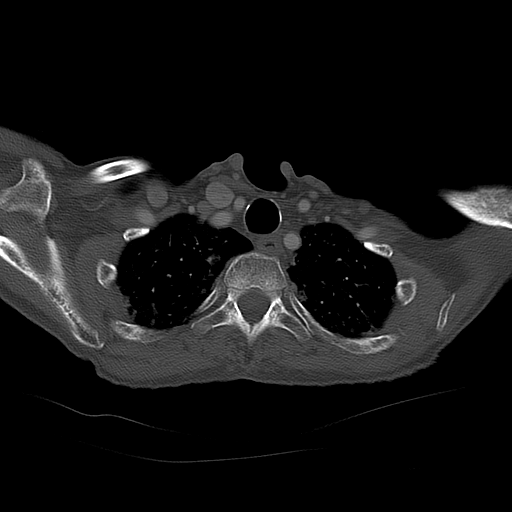

[10 of 14 positions shown; findings below may reference images not displayed]

FINDINGS: There is apical fibrotic change at the lung apices. The base of
the brain appears to be within normal limits without abnormal enhancement or
focal infarct. No vascular malformation is appreciated. The nasopharynx,
oropharynx and larynx appear unremarkable. The left submandibular gland is
small. The right submandibular gland and the carotids appear to be grossly
normal although the left parotid is much smaller than the right. There is no
definite mass at the base of the tongue on today's exam. No tonsillar mass
is seen. There is no cervical adenopathy or mass evident. The superior
mediastinum is unremarkable. Thyroid tissue is poorly seen. Orbital
structures as included on the study appear to be unremarkable.
IMPRESSION: No definite mass or adenopathy. The salivary glands on the
left are smaller than on the right which could be secondary to previous
radiation therapy. Correlate with history. Minimal thyroid tissue suggested.
Has the patient had a history of previous thyroidectomy or thyroid ablation?
Thyroid tissue showed enhancement previously.

[REDACTED]

## 2013-05-26 ENCOUNTER — Ambulatory Visit: Payer: Self-pay | Admitting: Oncology

## 2013-05-26 LAB — TSH: Thyroid Stimulating Horm: 1.31 u[IU]/mL

## 2013-06-15 DIAGNOSIS — G500 Trigeminal neuralgia: Secondary | ICD-10-CM | POA: Insufficient documentation

## 2013-06-15 DIAGNOSIS — E785 Hyperlipidemia, unspecified: Secondary | ICD-10-CM | POA: Insufficient documentation

## 2013-06-23 ENCOUNTER — Ambulatory Visit: Payer: Self-pay | Admitting: Oncology

## 2013-07-10 DIAGNOSIS — K449 Diaphragmatic hernia without obstruction or gangrene: Secondary | ICD-10-CM | POA: Insufficient documentation

## 2013-07-10 DIAGNOSIS — IMO0002 Reserved for concepts with insufficient information to code with codable children: Secondary | ICD-10-CM | POA: Insufficient documentation

## 2013-10-15 ENCOUNTER — Ambulatory Visit: Payer: Self-pay | Admitting: Oncology

## 2013-10-23 ENCOUNTER — Ambulatory Visit: Payer: Self-pay | Admitting: Oncology

## 2013-12-09 ENCOUNTER — Ambulatory Visit: Payer: Self-pay | Admitting: Oncology

## 2013-12-09 LAB — TSH: Thyroid Stimulating Horm: 0.441 u[IU]/mL — ABNORMAL LOW

## 2013-12-12 DIAGNOSIS — D320 Benign neoplasm of cerebral meninges: Secondary | ICD-10-CM | POA: Insufficient documentation

## 2013-12-23 ENCOUNTER — Ambulatory Visit: Payer: Self-pay | Admitting: Oncology

## 2014-04-08 ENCOUNTER — Ambulatory Visit: Payer: Self-pay | Admitting: Internal Medicine

## 2014-05-17 NOTE — Consult Note (Signed)
PATIENT NAME:  Amy Hess, Amy Hess MR#:  [id number] DATE OF BIRTH:  1928-04-29  DATE OF CONSULTATION:  03/16/2011  CONSULTING PHYSICIAN:  Halston Fairclough H. Posey Pronto, MD PRIMARY CARE PHYSICIAN: Frazier Richards, MD   REASON FOR CONSULTATION: Opinion regarding the patient's rheumatoid arthritis, hypertension, hyperlipidemia, postoperative.   HISTORY OF PRESENT ILLNESS: The patient is an 81 year old white female with a diagnosis of tongue cancer, status post partial glossectomy with radical neck dissection, who was seen postoperatively for opinion regarding her rheumatoid arthritis, hypertension, and hyperlipidemia. 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 She currently reports that she is doing okay. She does not have any current chest pains, no shortness of breath or palpitations. No fevers or chills.   PAST MEDICAL HISTORY:  1. Hypertension.  2. Hyperlipidemia.  3. Rheumatoid arthritis.  4. History of diverticulosis.  5. History of nephrolithiasis.  6. Hiatal hernia/gastroesophageal reflux disease.  7. History of possible Raynaud's syndrome.   PAST SURGICAL HISTORY:  1. Removal of uterine polyp.  2. Vein stripping. 3. Laser varicose vein surgery.   CURRENT MEDICATIONS:  1. Amlodipine 5 mg daily.  2. Aspirin 81 mg, 1 tab p.o. daily.  3. Calcium 600 plus vitamin D, 1 tab p.o. b.i.d.  4. Crestor 5 mg, 1/2 tab daily.  5. Folic acid 1 mg daily.  6. Hydrochlorothiazide 25 mg daily.  7. Losartan 50 mg daily.  8. Methotrexate 2.5 mg, 6 tablets every Thursday.  9. Multivitamin 1 tab p.o. daily.  10. Remicade infusion every 2 months, which currently is on hold.   ALLERGIES: Codeine causes GI upset, also intolerant to  gold, Plaquenil and sulfa.  SOCIAL HISTORY: No tobacco or alcohol use. The patient is very active.   FAMILY HISTORY: Significant for hypertension, heart  disease and stroke.    REVIEW OF SYSTEMS: CONSTITUTIONAL: Denies any fevers. Complains of some fatigue. No weakness. No pain. No weight loss. No weight gain. EYES: No blurred or double vision. No redness. No inflammation. No glaucoma. No cataracts. ENT: No tinnitus. No ear pain. No hearing loss. No seasonal or year-round allergies. No dentures. Does have a history of dentures. No difficulty with swallowing. RESPIRATORY: No cough. No wheezing. No hemoptysis. No chronic obstructive pulmonary disease. No tuberculosis. CARDIOVASCULAR: No chest pain. No orthopnea. No edema. No arrhythmia. Has history of high blood pressure. GI: No nausea, vomiting, diarrhea. No abdominal pain. No hematemesis. No melena. GU: Denies any dysuria, hematuria, renal calculus. ENDOCRINE: Denies any polyuria, nocturia, or thyroid problems. HEME/LYMPH: Denies any anemia, easy bruisability or bleeding. SKIN: No acne. No rash. No change in mole, hair or skin. MUSCULOSKELETAL: Has pain related to her rheumatoid arthritis. No gout, no redness of any other joints. NEUROLOGIC: No numbness. No weakness. No cerebrovascular accident. No transient ischemic attack. No seizures. PSYCHIATRIC: No anxiety. No insomnia. No ADD. No OCD.   PHYSICAL EXAMINATION:  VITAL SIGNS: Temperature 97.3, pulse 91, respirations 10, blood pressure 158/69.  O2-has a face  tent with 28% O2.   GENERAL: The patient is an elderly-appearing female in no acute distress.   HEENT: Head atraumatic, normocephalic. Pupils are equally round, reactive to light and accommodation. Extraocular movements are intact. Nasal exam shows no drainage or ulceration. Oropharynx: She is status post partial glossectomy.   NECK: There are sutures in the neck present. There is no carotid bruit. No thyromegaly.   CARDIOVASCULAR: Regular rate and rhythm. No murmurs, rubs,  clicks, or gallops. PMI is nondisplaced.   LUNGS: Clear to auscultation bilaterally without any rales, rhonchi, or wheezing.    ABDOMEN: Soft, nontender, nondistended. Positive bowel sounds x4.   EXTREMITIES: No clubbing, cyanosis, or edema.   NEUROLOGICAL: Awake, alert, oriented x3. No focal deficits.   SKIN: No rash.   LYMPHATICS: No lymph nodes palpable.   VASCULAR: Good DP, PT pulses.   NEUROLOGICAL: Awake, alert, oriented x3. No focal deficits.   ASSESSMENT AND PLAN: The patient is an 81 year old white female with diagnosis of tongue cancer, status post partial glossectomy with mass dissection, being observed in the Intensive Care Unit for respiratory monitoring.   1. Postoperative partial glossectomy: Continue to monitor her respiratory status. She is currently stable. Continue oxygen with supportive care.  2. Hypertension: We will continue amlodipine and losartan, hold HCTZ in light of surgery. We will do p.r.n. hydralazine for elevated blood pressure.  3. Hyperlipidemia: Continue Crestor as taking at home.  4. Miscellaneous: Recommended SCDs for deep vein thrombosis prophylaxis. With her tongue surgery, she will not likely be able to do incentive spirometry but when able can offer this as well.   TIME SPENT ON CONSULTATION:  32 minutes. ____________________________ Lafonda Mosses Posey Pronto, MD shp:cbb D: 03/16/2011 17:47:55 ET T: 03/16/2011 17:59:07 ET JOB#: [id number] cc: Ocie Cornfield. Ouida Sills, MD Alric Seton MD ELECTRONICALLY SIGNED 03/31/2011 7:50

## 2014-05-17 NOTE — Op Note (Signed)
PATIENT NAME:  Amy Hess, Amy Hess MR#:  [id number] DATE OF BIRTH:  [birth year]  DATE OF PROCEDURE:  03/16/2011  PREOPERATIVE DIAGNOSES:  1. Left tongue squamous cell carcinoma.  2. Thyroglossal duct cyst.   POSTOPERATIVE DIAGNOSES:  1. Left tongue squamous cell carcinoma.  2. Thyroglossal duct cyst.   PROCEDURES:  1. Left hemiglossectomy with left modified radical neck dissection.  2. Sistrunk procedure.   SURGEON: Janalee Dane, MD  ASSISTANT: Mahlon Gammon, M.D.   DESCRIPTION OF PROCEDURE: The patient was placed in supine position on the Operating Room table. After nasotracheal intubation had been accomplished (difficult) the tongue was retracted with 0 silk suture through the midline. A 1 cm margin around the entire clinically obvious tumor was taken. The margins were then taking circumferentially to examine by frozen section the margins. The margins came back positive for dysplasia at the anterior/inferior and posterior/inferior margins therefore, additional margins were taken. The posterior/inferior #2 was negative. The anterior/inferior #2 was positive.  Therefore another margin was taken there and this ultimately was negative for dysplasia. During the neck dissection and Sistrunk procedure the mouth was left alone and separate trays were used to keep the neck dissection clean. The neck dissection was carried out after the incision had been marked, locally anesthetized, and prepped and draped in the usual fashion. Subplatysmal flaps were elevated superiorly and inferiorly and then the flaps were retracted with dural hooks. The dissection was carried out beginning superiorly. Identification of the marginal mandibular nerve was carried out. This was reflected superiorly. The level one dissection was carried out preserving the lingual nerve and ligating the submandibular ganglion and Wharton's duct. The level one dissection was reflected posteriorly and dissection was carried around the  sternocleidomastoid muscle preserving the spinal accessory nerve. Dissection was carried from posterior to anterior and from inferior to superior and reflected off of the internal jugular vein preserving the vagus nerve and phrenic nerve. At the deep fascia there was a clinically enlarged node at the junction of level 2B and 3B. Additional nodes were taken form posterior to that to be included with the dissection. The hypoglossal nerve was positively identified and preserved. The entire specimen was oriented for pathology and taken down by me for appropriate orientation. Once this had been accomplished, a small lymph node at the tail of the parotid was taken. This was sent for frozen section and subsequently negative for squamous cell carcinoma. Attention was directed back to the tongue where the rest of the margins were taken and again subsequently returned as dysplasia until negative. Attention was then directed back to the neck area where the Sistrunk procedure was performed. The middle third of the hyoid and cyst were identified. Dissection was carried out from inferior to superior in the central dissection. This was completed with bone nippers to divide the middle third of the hyoid bone and keep it with the specimen. This was sent separately for pathology. Meticulous hemostasis was achieved in the wound and the flap was closed over two 10 mm Jackson-Pratt drains. Attention was directed back to the tongue where the remaining approximately 60% of the tongue was closed from posterior to anterior using 4-0 Vicryl sutures in mixed simple and vertical mattress suture technique. The oral cavity and oropharynx were copiously irrigated with saline. The patient was returned to anesthesia, allowed to emerge from anesthesia in the Operating Room, taken to the recovery room in stable condition. There were no complications. Estimated blood loss approximately 60 mL.  ____________________________ [employer]  Carlis Abbott,  MD jmc:cms D: 03/16/2011 20:42:40 ET T: 03/17/2011 07:12:24 ET JOB#: [id number]  cc: Janalee Dane, MD, <Dictator> Nicholos Johns MD ELECTRONICALLY SIGNED 03/27/2011 10:04

## 2014-05-17 NOTE — Consult Note (Signed)
Reason for Visit: This 81 year old Female patient presents to the clinic for initial evaluation of  Oral tongue cancer .   Referred by Dr. Grayland Ormond.  Diagnosis:   Chief Complaint/Diagnosis   81 year old female status post resection of left oral tongue lesion pathologic stage to (T2, N0, M0) with carcinoma in situ at margin. Also neck node positive for metastatic thyroid cancer   Pathology Report Pathology report reviewed    Imaging Report PET/CT scan reviewed    Referral Report Clinical notes reviewed    Planned Treatment Regimen Adjuvant radiation therapy    HPI   patient is an 81 year old female in excellent clinical condition who presented with a lesion of her left oral tongue which he complains secondary to poor fitting dentures over the past 2 years. She was seen in January 2013 ENT performed a biopsy showing moderately differentiated squamous cell carcinoma. She went underwent left partial hemiglossectomy showing invasive moderately differentiated squamous cell carcinoma. Moderate dysplasia involving anterior posterior tips. Tumor margin was close for invasive component at 3 mm. There was carcinoma in situ at multiple margins. Focal papillary thyroid carcinoma was present in 1 level II lymph node removed.interestingly PET/CT scan prior to surgery showed large area of avid uptake in her left oral tongue as well as to lower cervical lymph nodes. Patient was seen by Dr. Grayland Ormond. Patient was presented at our weekly tumor conference. She scheduled have 2 teeth extracted from her lower mouth the next 2 weeks. She is having no head and neck pain at this time no problems with speech and no dysphagia.  Past Hx:    Hiatal hernia:    Reflux:    Possible Raynauds Disease:    Rheumatoid Arthritis:    Kidney stones:    Hypertension:    tongue biopsy:    Vein stripping:    Excision uterine polyp:   Past, Family and Social History:   Past Medical History positive    Cardiovascular  hypertension    Gastrointestinal GERD    Genitourinary kidney stones    Past Surgical History Vein stripping, excision of uterine fibroid    Past Medical History Comments Rheumatoid arthritis    Family History positive    Family History Comments Hypertension and congestive heart disease    Social History noncontributory    Additional Past Medical and Surgical History Accompanied by daughter today   Allergies:   Sulfa: Swelling  Codeine: N/V/Diarrhea  Gold Containing compounds: Other  Plaquenil Sulfate: Unknown  Home Meds:  Home Medications: Medication Instructions Status  Remicade    infusion every 2 months Active  methotrexate takes 2.5mg  6 tabs each Thurs Active  Remicade  intravenous  on hold until after ENT surgery at end of feb 2103. Active  amlodipine 5 mg oral tablet 1  orally once a day (in the morning) Active  Aspir 81 oral delayed release tablet 1 tab(s) orally once a day (at bedtime) Active  Calcium 600+D 1 tab(s) orally 2 times a day Active  Crestor 5 mg oral tablet 0.5 tab(s) orally once a day (at bedtime) Active  folic acid 1 mg oral tablet 1 tab(s) orally once a day (in the morning) Active  hydrochlorothiazide 25 mg oral tablet 1 tab(s) orally once a day (in the morning) Active  losartan 50 mg oral tablet 1 tab(s) orally once a day (in the morning) Active  multivitamin 1 tab(s) orally once a day (in the morning) Active   Review of Systems:   General negative  Performance Status (ECOG) 0    Skin negative    Breast negative    Ophthalmologic negative    ENMT see HPI    Respiratory and Thorax negative    Cardiovascular negative    Gastrointestinal negative    Genitourinary negative    Musculoskeletal negative    Neurological negative    Psychiatric negative    Hematology/Lymphatics negative    Endocrine negative    Allergic/Immunologic negative   Nursing Notes:  Nursing Vital Signs and Chemo Nursing Nursing Notes: *CC Vital  Signs Flowsheet:   07-Mar-13 10:20   Temp Temperature 96.4   Pulse Pulse 78   Respirations Respirations 20   SBP SBP 119   DBP DBP 75   Pain Scale (0-10)  6   Current Weight (kg) (kg) 49.7   Height (cm) centimeters 160   BSA (m2) 1.4   Physical Exam:  General/Skin/HEENT:   General normal    Skin normal    Additional PE Well-developed elderly female in NAD. Patient is wears dentures on her upper plate. She has 2 teeth in the lower jaw which are both slated for excision. She has partial glossectomy. No evidence of mass or nodularity in the oral tongue is noted. Indirect mirror examination shows upper airway clear cords approximating well vallecula and base of tongue within normal limits. Neck is clear without evidence of some digastric cervical or supraclavicular adenopathy. Lungs are clear to A&P cardiac examination shows regular rate and rhythm.   Breasts/Resp/CV/GI/GU:   Respiratory and Thorax normal    Cardiovascular normal    Gastrointestinal normal    Genitourinary normal   MS/Neuro/Psych/Lymph:   Musculoskeletal normal    Neurological normal    Lymphatics normal   Other Results:  Radiology Results: CT:    13-Feb-13 09:31, CT Neck With Contrast   CT Neck With Contrast    REASON FOR EXAM:    tongue mass  COMMENTS:       PROCEDURE: CT  - CT NECK WITH CONTRAST  - Mar 08 2011  9:31AM     RESULT: Axial CT scanning was performed through the neck with   reconstructions at 3 mm intervals and slice thicknesses. The patient   received 70 cc of Isovue-300. Review of multiplanar reconstructed images   was performed separately on the VIA monitor. The patient has a known lung   mass.    There is subtle asymmetry of the density of the lateral aspect on the   left with respect to the right seen on images 39 through 43 which may   reflect a mass. I do not see bulky anterior or posterior cervical lymph   nodes but there are numerous nodes measuring 1 cm in diameter or  less.     The parotid and submandibular glands are normal in appearance. The   jugular and carotid vessels do not appear displaced. The paranasal   sinuses exhibit no evidence of inflammation or masses. The nasal passages   are patent. The nasal septum is deviated mildly toward the left. The   prevertebral soft tissue spaces are normal in thickness. The tonsillar   and adenoidal regions do not appear abnormal. There are tonsillar   calcifications. Along the inferior aspect of the cricoid bone on images   56 through 60 to is a tiny soft tissue density structure. It does not   contain air. It is located in the midline and slightly to the left. This   could reflect a small glossal  duct cyst.    The laryngeal structures are normal in appearance. In the lower pole of   the left thyroid lobe there is a hypodense nodule measuring 1.2 cm   transversely by approximately 1 cm AP x 1.4 cm in superior to inferior   dimension.  The cervical vertebral bodies are preserved in height. There is mild   degenerative change of the mid cervical spine.    IMPRESSION:   1. The patient has a known tongue mass. This is presumably on the left   laterally.  2. I do not see bulky cervical lymph nodes. No salivary gland masses are   demonstrated.  3. There may be a small thyroglossal duct cyst just to the left of   midline seen on images 55 through 62.  4. There is a nodule in the lower pole of the left thyroid lobe.          Verified By: DAVID A. Martinique, M.D., MD  Nuclear Med:    [phone] 12:13, PET/CT Scan Head/Neck CA Initial Staging   PET/CT Scan Head/Neck CA Initial Staging    REASON FOR EXAM:    head and neck CA  COMMENTS:       PROCEDURE: PET - PET/CT INIT STAG HEAD/NECK CA  - Mar 13 2011 12:13PM     RESULT:     Total-body that was performed in conjunction with a non-attenuated CT   status post right arm injection of 12.4 mCi of F-18 labeled   fluorodeoxyglucose. The patient's fasting blood  glucose was measures at   76 mg/dL. The initial injection time is at 9:55 a.m.    Findings: Appropriate biodistribution is identified in the region of the   base of the brain, heart, liver, spleen, kidneys, urinary bladder and   bowel.  An area of intense hypermetabolic activity projects along the left   lateral aspect of the tongue demonstrating a mean SUV of 7.46. This   finding is consistent with patient's reported history of neoplastic   disease involving the tongue. A focal punctate area of hypermetabolic   activity projects within a carotid space lymph node on the left   demonstrating an SUV of 2. This lymph node measures approximately 5 to 6   mm in narrowest dimensions. No further regions of abnormal hypermetabolic   activity are identified.    IMPRESSION:      1. Findings consistent with the patient's reported history of neoplastic   disease involving the left lateral aspect of the tongue.  2. Findings which may represent either a reactive lymph node or possibly   a metastatic lymph node within the carotid space on the left.  Thank you for the opportunity to contribute to the care of your patient.           Verified By: Mikki Santee, M.D., MD   Assessment and Plan:  Impression:   pathologic stage II squamous cell carcinoma the left oral tongue status post partial glossectomy in 81 year old female. 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  Plan:   I have presented her case her weekly tumor conference. Recommendation was made to go ahead with radiation therapy to her oral tongue. Based on her neck dissection do not believe any to treat her lower neck nodes. Would treat her left oral tongue up to 6400 cGy in 32 fractions using IMRT treatment planning and delivery. We will try to spare his salivary glands as much as possible using IMRT treatment.  Risks and benefits of treatment were discussed with the patient and her daughter. She  knows to expect possible fatigue, skin reaction, and oral mucositis. After completion of radiation we will have her back ENT to workup her thyroid disease possibility of thyroid resection with adjuvant iodine treatment may be entertained. I have set her up for CT simulation after removal of her 2 lower teeth which will occur next week.  I would like to take this opportunity to thank you for allowing me to continue to participate in this patient's care.  CC Referral:   cc: Nadeen Landau, Dr. Frazier Richards   Electronic Signatures: Baruch Gouty, Roda Shutters (MD)  (Signed 07-Mar-13 15:05)  Authored: HPI, Diagnosis, Past Hx, PFSH, Allergies, Home Meds, ROS, Nursing Notes, Physical Exam, Other Results, Encounter Assessment and Plan, CC Referring Physician   Last Updated: 07-Mar-13 15:05 by Armstead Peaks (MD)

## 2014-05-17 NOTE — Op Note (Signed)
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   PROCEDURE PERFORMED: Total thyroidectomy with nerve dissection and preservation.   SURGEON: Janalee Dane, M.D.   DESCRIPTION OF PROCEDURE: The patient was placed in the supine position on the operating room table. After general endotracheal anesthesia had been induced, the patient was placed on a shoulder roll. The skin incision was marked, locally anesthetized, and prepped and draped in the usual fashion. Subplatysmal planes were elevated. After the incision was made, the skin was retracted with dural hooks. The midline raphe was divided and the patient's left side was addressed first. The left lobe was skeletonized. Preservation and dissection of the recurrent laryngeal nerve and both the superior and inferior parathyroid glands were carried out. The gland was sent in formalin for pathologic analysis. Attention was directed to the right side. A similar procedure was carried out. The inferior parathyroid gland was identified. The superior was not, however. At the conclusion of the case, the recurrent laryngeal nerves were stimulated. The wound was copiously irrigated, meticulous hemostasis was achieved, and the wound was closed in layers over a 10 mm Jackson-Pratt drain. On small 2 x 2 cm Surgicel was placed at each Berry's ligament. These were of course placed before closure of the wound. The patient was then returned to anesthesia, allowed to emerge from anesthesia in the Operating Room, and taken to the Recovery Room in stable condition. There were no complications. Estimated blood loss 10 mL. ____________________________ J. Nadeen Landau, MD jmc:slb D: 06/22/2011 09:35:01 ET     T: 06/22/2011 11:54:50  ET       JOB#: [id number] cc: Janalee Dane, MD, <Dictator> Nicholos Johns MD ELECTRONICALLY SIGNED 06/27/2011 17:05

## 2014-06-02 ENCOUNTER — Other Ambulatory Visit: Payer: Self-pay | Admitting: Internal Medicine

## 2014-06-02 DIAGNOSIS — R1010 Upper abdominal pain, unspecified: Secondary | ICD-10-CM

## 2014-06-02 DIAGNOSIS — R1030 Lower abdominal pain, unspecified: Secondary | ICD-10-CM

## 2014-06-09 ENCOUNTER — Ambulatory Visit
Admission: RE | Admit: 2014-06-09 | Discharge: 2014-06-09 | Disposition: A | Discharge status: Home / Self Care | Payer: Medicare Other | Source: Ambulatory Visit | Attending: Internal Medicine | Admitting: Internal Medicine

## 2014-06-09 DIAGNOSIS — K449 Diaphragmatic hernia without obstruction or gangrene: Secondary | ICD-10-CM | POA: Insufficient documentation

## 2014-06-09 DIAGNOSIS — K5730 Diverticulosis of large intestine without perforation or abscess without bleeding: Secondary | ICD-10-CM | POA: Insufficient documentation

## 2014-06-09 DIAGNOSIS — R1010 Upper abdominal pain, unspecified: Secondary | ICD-10-CM | POA: Diagnosis present

## 2014-06-09 DIAGNOSIS — D259 Leiomyoma of uterus, unspecified: Secondary | ICD-10-CM | POA: Diagnosis not present

## 2014-06-09 DIAGNOSIS — R1030 Lower abdominal pain, unspecified: Secondary | ICD-10-CM | POA: Diagnosis present

## 2014-06-09 DIAGNOSIS — N200 Calculus of kidney: Secondary | ICD-10-CM | POA: Diagnosis not present

## 2014-06-09 HISTORY — DX: Essential (primary) hypertension: I10

## 2014-06-09 HISTORY — DX: Malignant (primary) neoplasm, unspecified: C80.1

## 2014-06-09 MED ORDER — IOHEXOL 300 MG/ML  SOLN: 100.0000 mL | Freq: Once | INTRAMUSCULAR | Status: AC | PRN

## 2014-06-09 MED ORDER — IOHEXOL 300 MG/ML  SOLN
100.0000 mL | Freq: Once | INTRAMUSCULAR | Status: AC | PRN
  Administered 2014-06-09: 100 mL via INTRAVENOUS

## 2014-06-11 ENCOUNTER — Other Ambulatory Visit: Payer: Self-pay | Admitting: Internal Medicine

## 2014-06-11 DIAGNOSIS — K8050 Calculus of bile duct without cholangitis or cholecystitis without obstruction: Secondary | ICD-10-CM

## 2014-06-15 ENCOUNTER — Ambulatory Visit: Payer: Medicare Other

## 2014-06-16 ENCOUNTER — Inpatient Hospital Stay: Payer: Medicare Other | Attending: Oncology

## 2014-06-16 ENCOUNTER — Other Ambulatory Visit: Payer: Self-pay | Admitting: *Deleted

## 2014-06-16 DIAGNOSIS — C029 Malignant neoplasm of tongue, unspecified: Secondary | ICD-10-CM

## 2014-06-16 DIAGNOSIS — Z85810 Personal history of malignant neoplasm of tongue: Secondary | ICD-10-CM | POA: Insufficient documentation

## 2014-06-16 DIAGNOSIS — K449 Diaphragmatic hernia without obstruction or gangrene: Secondary | ICD-10-CM | POA: Diagnosis not present

## 2014-06-16 DIAGNOSIS — K8050 Calculus of bile duct without cholangitis or cholecystitis without obstruction: Secondary | ICD-10-CM | POA: Diagnosis not present

## 2014-06-16 DIAGNOSIS — Z85850 Personal history of malignant neoplasm of thyroid: Secondary | ICD-10-CM | POA: Insufficient documentation

## 2014-06-16 DIAGNOSIS — C73 Malignant neoplasm of thyroid gland: Secondary | ICD-10-CM

## 2014-06-16 DIAGNOSIS — Z79899 Other long term (current) drug therapy: Secondary | ICD-10-CM | POA: Insufficient documentation

## 2014-06-16 DIAGNOSIS — K5710 Diverticulosis of small intestine without perforation or abscess without bleeding: Secondary | ICD-10-CM | POA: Insufficient documentation

## 2014-06-16 DIAGNOSIS — I10 Essential (primary) hypertension: Secondary | ICD-10-CM | POA: Insufficient documentation

## 2014-06-16 DIAGNOSIS — Z9049 Acquired absence of other specified parts of digestive tract: Secondary | ICD-10-CM | POA: Insufficient documentation

## 2014-06-16 DIAGNOSIS — I2510 Atherosclerotic heart disease of native coronary artery without angina pectoris: Secondary | ICD-10-CM | POA: Diagnosis not present

## 2014-06-16 DIAGNOSIS — Z7952 Long term (current) use of systemic steroids: Secondary | ICD-10-CM | POA: Diagnosis not present

## 2014-06-16 DIAGNOSIS — M129 Arthropathy, unspecified: Secondary | ICD-10-CM | POA: Insufficient documentation

## 2014-06-16 DIAGNOSIS — R109 Unspecified abdominal pain: Secondary | ICD-10-CM | POA: Insufficient documentation

## 2014-06-16 LAB — CBC WITH DIFFERENTIAL/PLATELET
Basophils Absolute: 0 10*3/uL (ref 0–0.1)
Basophils Relative: 1 %
EOS PCT: 1 %
Eosinophils Absolute: 0 10*3/uL (ref 0–0.7)
HEMATOCRIT: 40.7 % (ref 35.0–47.0)
HEMOGLOBIN: 13.3 g/dL (ref 12.0–16.0)
LYMPHS PCT: 14 %
Lymphs Abs: 0.7 10*3/uL — ABNORMAL LOW (ref 1.0–3.6)
MCH: 29.8 pg (ref 26.0–34.0)
MCHC: 32.8 g/dL (ref 32.0–36.0)
MCV: 90.8 fL (ref 80.0–100.0)
MONO ABS: 0.5 10*3/uL (ref 0.2–0.9)
Monocytes Relative: 9 %
Neutro Abs: 3.8 10*3/uL (ref 1.4–6.5)
Neutrophils Relative %: 75 %
Platelets: 239 10*3/uL (ref 150–440)
RBC: 4.48 MIL/uL (ref 3.80–5.20)
RDW: 13.5 % (ref 11.5–14.5)
WBC: 5.1 10*3/uL (ref 3.6–11.0)

## 2014-06-16 LAB — T4, FREE: Free T4: 1.21 ng/dL — ABNORMAL HIGH (ref 0.61–1.12)

## 2014-06-16 LAB — TSH: TSH: 0.429 u[IU]/mL (ref 0.350–4.500)

## 2014-06-17 ENCOUNTER — Other Ambulatory Visit: Payer: Self-pay | Admitting: Internal Medicine

## 2014-06-17 ENCOUNTER — Ambulatory Visit
Admission: RE | Admit: 2014-06-17 | Discharge: 2014-06-17 | Disposition: A | Discharge status: Home / Self Care | Payer: Medicare Other | Source: Ambulatory Visit | Attending: Internal Medicine | Admitting: Internal Medicine

## 2014-06-17 DIAGNOSIS — K838 Other specified diseases of biliary tract: Secondary | ICD-10-CM | POA: Diagnosis not present

## 2014-06-17 DIAGNOSIS — K8050 Calculus of bile duct without cholangitis or cholecystitis without obstruction: Secondary | ICD-10-CM

## 2014-06-17 LAB — THYROGLOBULIN ANTIBODY: Thyroglobulin Antibody: <1 IU/mL (ref 0.0–0.9)

## 2014-06-18 ENCOUNTER — Other Ambulatory Visit: Payer: Self-pay | Admitting: Internal Medicine

## 2014-06-18 DIAGNOSIS — K838 Other specified diseases of biliary tract: Secondary | ICD-10-CM

## 2014-06-23 ENCOUNTER — Inpatient Hospital Stay (HOSPITAL_BASED_OUTPATIENT_CLINIC_OR_DEPARTMENT_OTHER): Payer: Medicare Other | Admitting: Oncology

## 2014-06-23 ENCOUNTER — Encounter: Payer: Self-pay | Admitting: Oncology

## 2014-06-23 VITALS — BP 158/85 | HR 72 | Temp 97.8°F | Resp 16 | Wt 117.5 lb

## 2014-06-23 DIAGNOSIS — C760 Malignant neoplasm of head, face and neck: Secondary | ICD-10-CM

## 2014-06-23 DIAGNOSIS — K5710 Diverticulosis of small intestine without perforation or abscess without bleeding: Secondary | ICD-10-CM | POA: Diagnosis not present

## 2014-06-23 DIAGNOSIS — Z85850 Personal history of malignant neoplasm of thyroid: Secondary | ICD-10-CM

## 2014-06-23 DIAGNOSIS — Z85810 Personal history of malignant neoplasm of tongue: Secondary | ICD-10-CM | POA: Diagnosis not present

## 2014-06-23 DIAGNOSIS — K449 Diaphragmatic hernia without obstruction or gangrene: Secondary | ICD-10-CM

## 2014-06-23 DIAGNOSIS — Z79899 Other long term (current) drug therapy: Secondary | ICD-10-CM

## 2014-06-23 DIAGNOSIS — Z7952 Long term (current) use of systemic steroids: Secondary | ICD-10-CM

## 2014-06-23 DIAGNOSIS — R109 Unspecified abdominal pain: Secondary | ICD-10-CM

## 2014-06-23 DIAGNOSIS — I2510 Atherosclerotic heart disease of native coronary artery without angina pectoris: Secondary | ICD-10-CM

## 2014-06-23 DIAGNOSIS — Z9049 Acquired absence of other specified parts of digestive tract: Secondary | ICD-10-CM

## 2014-06-23 DIAGNOSIS — I10 Essential (primary) hypertension: Secondary | ICD-10-CM

## 2014-06-23 DIAGNOSIS — M129 Arthropathy, unspecified: Secondary | ICD-10-CM

## 2014-06-23 DIAGNOSIS — K8050 Calculus of bile duct without cholangitis or cholecystitis without obstruction: Secondary | ICD-10-CM | POA: Diagnosis not present

## 2014-06-25 ENCOUNTER — Ambulatory Visit
Admission: RE | Admit: 2014-06-25 | Discharge: 2014-06-25 | Disposition: A | Discharge status: Home / Self Care | Payer: Medicare Other | Source: Ambulatory Visit | Attending: Internal Medicine | Admitting: Internal Medicine

## 2014-06-25 DIAGNOSIS — R1084 Generalized abdominal pain: Secondary | ICD-10-CM | POA: Diagnosis present

## 2014-06-25 DIAGNOSIS — K838 Other specified diseases of biliary tract: Secondary | ICD-10-CM

## 2014-06-25 DIAGNOSIS — K8050 Calculus of bile duct without cholangitis or cholecystitis without obstruction: Secondary | ICD-10-CM | POA: Insufficient documentation

## 2014-06-25 DIAGNOSIS — K5710 Diverticulosis of small intestine without perforation or abscess without bleeding: Secondary | ICD-10-CM | POA: Diagnosis not present

## 2014-06-26 ENCOUNTER — Encounter: Payer: Self-pay | Admitting: Emergency Medicine

## 2014-06-26 DIAGNOSIS — R1011 Right upper quadrant pain: Secondary | ICD-10-CM | POA: Diagnosis present

## 2014-06-26 DIAGNOSIS — Z79899 Other long term (current) drug therapy: Secondary | ICD-10-CM | POA: Insufficient documentation

## 2014-06-26 DIAGNOSIS — Z7982 Long term (current) use of aspirin: Secondary | ICD-10-CM | POA: Insufficient documentation

## 2014-06-26 DIAGNOSIS — Z7952 Long term (current) use of systemic steroids: Secondary | ICD-10-CM | POA: Diagnosis not present

## 2014-06-26 DIAGNOSIS — K830 Cholangitis: Secondary | ICD-10-CM | POA: Insufficient documentation

## 2014-06-26 DIAGNOSIS — I10 Essential (primary) hypertension: Secondary | ICD-10-CM | POA: Insufficient documentation

## 2014-06-26 NOTE — ED Notes (Signed)
Patient states that she has a stone in her common bile duct and has an appointment with GI. Patient states that she has had increase in pain today.

## 2014-06-27 ENCOUNTER — Emergency Department
Admission: EM | Admit: 2014-06-27 | Discharge: 2014-06-27 | Disposition: A | Discharge status: Other Acute Inpatient Hospital | Payer: Medicare Other | Attending: Emergency Medicine | Admitting: Emergency Medicine

## 2014-06-27 ENCOUNTER — Inpatient Hospital Stay (HOSPITAL_COMMUNITY)
Admission: EM | Admit: 2014-06-27 | Discharge: 2014-06-29 | DRG: 445 | Disposition: A | Discharge status: Home / Self Care | Payer: Medicare Other | Source: Other Acute Inpatient Hospital | Attending: Internal Medicine | Admitting: Internal Medicine

## 2014-06-27 ENCOUNTER — Other Ambulatory Visit: Payer: Self-pay

## 2014-06-27 DIAGNOSIS — Z7952 Long term (current) use of systemic steroids: Secondary | ICD-10-CM | POA: Diagnosis not present

## 2014-06-27 DIAGNOSIS — E890 Postprocedural hypothyroidism: Secondary | ICD-10-CM | POA: Diagnosis not present

## 2014-06-27 DIAGNOSIS — Z7982 Long term (current) use of aspirin: Secondary | ICD-10-CM

## 2014-06-27 DIAGNOSIS — E876 Hypokalemia: Secondary | ICD-10-CM | POA: Diagnosis present

## 2014-06-27 DIAGNOSIS — K8309 Other cholangitis: Secondary | ICD-10-CM

## 2014-06-27 DIAGNOSIS — E038 Other specified hypothyroidism: Secondary | ICD-10-CM | POA: Diagnosis not present

## 2014-06-27 DIAGNOSIS — K8051 Calculus of bile duct without cholangitis or cholecystitis with obstruction: Secondary | ICD-10-CM | POA: Diagnosis not present

## 2014-06-27 DIAGNOSIS — K8031 Calculus of bile duct with cholangitis, unspecified, with obstruction: Secondary | ICD-10-CM | POA: Diagnosis not present

## 2014-06-27 DIAGNOSIS — M069 Rheumatoid arthritis, unspecified: Secondary | ICD-10-CM | POA: Diagnosis present

## 2014-06-27 DIAGNOSIS — K8050 Calculus of bile duct without cholangitis or cholecystitis without obstruction: Secondary | ICD-10-CM

## 2014-06-27 DIAGNOSIS — I10 Essential (primary) hypertension: Secondary | ICD-10-CM | POA: Diagnosis not present

## 2014-06-27 DIAGNOSIS — I2510 Atherosclerotic heart disease of native coronary artery without angina pectoris: Secondary | ICD-10-CM | POA: Diagnosis present

## 2014-06-27 DIAGNOSIS — Z85819 Personal history of malignant neoplasm of unspecified site of lip, oral cavity, and pharynx: Secondary | ICD-10-CM

## 2014-06-27 DIAGNOSIS — R1011 Right upper quadrant pain: Secondary | ICD-10-CM | POA: Diagnosis present

## 2014-06-27 DIAGNOSIS — R7989 Other specified abnormal findings of blood chemistry: Secondary | ICD-10-CM | POA: Diagnosis not present

## 2014-06-27 DIAGNOSIS — E039 Hypothyroidism, unspecified: Secondary | ICD-10-CM | POA: Diagnosis present

## 2014-06-27 DIAGNOSIS — Z85850 Personal history of malignant neoplasm of thyroid: Secondary | ICD-10-CM | POA: Diagnosis not present

## 2014-06-27 DIAGNOSIS — K8035 Calculus of bile duct with chronic cholangitis with obstruction: Secondary | ICD-10-CM

## 2014-06-27 DIAGNOSIS — K8020 Calculus of gallbladder without cholecystitis without obstruction: Secondary | ICD-10-CM

## 2014-06-27 DIAGNOSIS — Z79899 Other long term (current) drug therapy: Secondary | ICD-10-CM | POA: Diagnosis not present

## 2014-06-27 DIAGNOSIS — K830 Cholangitis: Secondary | ICD-10-CM | POA: Diagnosis not present

## 2014-06-27 DIAGNOSIS — K8033 Calculus of bile duct with acute cholangitis with obstruction: Secondary | ICD-10-CM

## 2014-06-27 LAB — COMPREHENSIVE METABOLIC PANEL
ALT: 377 U/L — ABNORMAL HIGH (ref 14–54)
AST: 524 U/L — ABNORMAL HIGH (ref 15–41)
Albumin: 4.3 g/dL (ref 3.5–5.0)
Alkaline Phosphatase: 172 U/L — ABNORMAL HIGH (ref 38–126)
Anion gap: 11 (ref 5–15)
BUN: 13 mg/dL (ref 6–20)
CALCIUM: 9.7 mg/dL (ref 8.9–10.3)
CHLORIDE: 102 mmol/L (ref 101–111)
CO2: 29 mmol/L (ref 22–32)
Creatinine, Ser: 0.85 mg/dL (ref 0.44–1.00)
GFR calc Af Amer: >60 mL/min (ref >60)
Glucose, Bld: 112 mg/dL — ABNORMAL HIGH (ref 65–99)
Potassium: 3.2 mmol/L — ABNORMAL LOW (ref 3.5–5.1)
Sodium: 142 mmol/L (ref 135–145)
Total Bilirubin: 2.7 mg/dL — ABNORMAL HIGH (ref 0.3–1.2)
Total Protein: 7.5 g/dL (ref 6.5–8.1)

## 2014-06-27 LAB — TROPONIN I: Troponin I: <0.03 ng/mL (ref <0.031)

## 2014-06-27 LAB — CBC WITH DIFFERENTIAL/PLATELET
BASOS ABS: 0 10*3/uL (ref 0–0.1)
BASOS PCT: 0 %
EOS PCT: 0 %
Eosinophils Absolute: 0 10*3/uL (ref 0–0.7)
HCT: 39.8 % (ref 35.0–47.0)
Hemoglobin: 13.4 g/dL (ref 12.0–16.0)
LYMPHS PCT: 2 %
Lymphs Abs: 0.1 10*3/uL — ABNORMAL LOW (ref 1.0–3.6)
MCH: 30.5 pg (ref 26.0–34.0)
MCHC: 33.6 g/dL (ref 32.0–36.0)
MCV: 90.8 fL (ref 80.0–100.0)
MONO ABS: 0.3 10*3/uL (ref 0.2–0.9)
Monocytes Relative: 4 %
Neutro Abs: 7.7 10*3/uL — ABNORMAL HIGH (ref 1.4–6.5)
Neutrophils Relative %: 94 %
PLATELETS: 162 10*3/uL (ref 150–440)
RBC: 4.38 MIL/uL (ref 3.80–5.20)
RDW: 13.3 % (ref 11.5–14.5)
WBC: 8.2 10*3/uL (ref 3.6–11.0)

## 2014-06-27 LAB — LIPASE, BLOOD: Lipase: 45 U/L (ref 22–51)

## 2014-06-27 MED ORDER — FOLIC ACID 1 MG PO TABS
1.0000 mg | ORAL_TABLET | Freq: Every day | ORAL | Status: DC
  Administered 2014-06-27: 1 mg via ORAL
  Filled 2014-06-27 (×2): qty 1

## 2014-06-27 MED ORDER — FAMOTIDINE 20 MG PO TABS
20.0000 mg | ORAL_TABLET | Freq: Every day | ORAL | Status: DC
  Administered 2014-06-27: 20 mg via ORAL
  Filled 2014-06-27 (×2): qty 1

## 2014-06-27 MED ORDER — PREDNISONE 5 MG PO TABS
2.5000 mg | ORAL_TABLET | Freq: Every day | ORAL | Status: DC
  Administered 2014-06-27 – 2014-06-29 (×2): 2.5 mg via ORAL
  Filled 2014-06-27 (×2): qty 1

## 2014-06-27 MED ORDER — ASPIRIN EC 81 MG PO TBEC
81.0000 mg | DELAYED_RELEASE_TABLET | Freq: Every day | ORAL | Status: DC
  Administered 2014-06-27: 81 mg via ORAL
  Filled 2014-06-27 (×2): qty 1

## 2014-06-27 MED ORDER — SODIUM CHLORIDE 0.9 % IV SOLN
INTRAVENOUS | Status: DC
  Administered 2014-06-27 – 2014-06-29 (×5): via INTRAVENOUS

## 2014-06-27 MED ORDER — SODIUM CHLORIDE 0.9 % IV SOLN
1.5000 g | Freq: Four times a day (QID) | INTRAVENOUS | Status: DC
  Administered 2014-06-27 – 2014-06-29 (×8): 1.5 g via INTRAVENOUS
  Filled 2014-06-27 (×10): qty 1.5

## 2014-06-27 MED ORDER — IBUPROFEN 400 MG PO TABS
ORAL_TABLET | ORAL | Status: AC
  Filled 2014-06-27: qty 1

## 2014-06-27 MED ORDER — LORATADINE 10 MG PO TABS
10.0000 mg | ORAL_TABLET | Freq: Every day | ORAL | Status: DC
  Administered 2014-06-27: 10 mg via ORAL
  Filled 2014-06-27 (×2): qty 1

## 2014-06-27 MED ORDER — PANTOPRAZOLE SODIUM 40 MG PO TBEC
80.0000 mg | DELAYED_RELEASE_TABLET | Freq: Every day | ORAL | Status: DC
  Administered 2014-06-27: 80 mg via ORAL
  Filled 2014-06-27: qty 2

## 2014-06-27 MED ORDER — MORPHINE SULFATE 2 MG/ML IJ SOLN
2.0000 mg | INTRAMUSCULAR | Status: DC | PRN
  Administered 2014-06-28 (×3): 2 mg via INTRAVENOUS
  Filled 2014-06-27 (×3): qty 1

## 2014-06-27 MED ORDER — IBUPROFEN 400 MG PO TABS
400.0000 mg | ORAL_TABLET | Freq: Once | ORAL | Status: AC
  Administered 2014-06-27: 400 mg via ORAL

## 2014-06-27 MED ORDER — AMLODIPINE BESYLATE 5 MG PO TABS
5.0000 mg | ORAL_TABLET | Freq: Every day | ORAL | Status: DC
  Administered 2014-06-27 – 2014-06-28 (×2): 5 mg via ORAL
  Filled 2014-06-27 (×2): qty 1

## 2014-06-27 MED ORDER — LEVOTHYROXINE SODIUM 88 MCG PO TABS
88.0000 ug | ORAL_TABLET | Freq: Every day | ORAL | Status: DC
  Administered 2014-06-27 – 2014-06-29 (×2): 88 ug via ORAL
  Filled 2014-06-27 (×3): qty 1

## 2014-06-27 MED ORDER — POTASSIUM CHLORIDE CRYS ER 20 MEQ PO TBCR
40.0000 meq | EXTENDED_RELEASE_TABLET | Freq: Once | ORAL | Status: AC
  Administered 2014-06-27: 40 meq via ORAL
  Filled 2014-06-27: qty 2

## 2014-06-27 MED ORDER — PIPERACILLIN-TAZOBACTAM 3.375 G IVPB
3.3750 g | Freq: Once | INTRAVENOUS | Status: DC
  Administered 2014-06-27: 3.375 g via INTRAVENOUS

## 2014-06-27 MED ORDER — PIPERACILLIN-TAZOBACTAM 3.375 G IVPB
INTRAVENOUS | Status: AC
  Filled 2014-06-27: qty 50

## 2014-06-27 NOTE — ED Provider Notes (Signed)
[facility] Emergency Department Provider Note  ____________________________________________  Time seen: 2:40 AM  I have reviewed the triage vital signs and the nursing notes.   HISTORY  Chief Complaint Abdominal Pain and Nausea      HPI Amy Hess is a 81 y.o. female presents with worsened right upper quadrant pain and nausea times today.Current pain score 5 out of 10. Of note patient diagnosed with choledocholithiasis byMRI yesterday.      Past Medical History  Diagnosis Date  . Cancer     thyroid and mouth cancer 2013  . Hypertension   . Arthritis   . [name] (coronary artery disease)     There are no active problems to display for this patient.   Past Surgical History  Procedure Laterality Date  . Cholecystectomy    . Total thyroidectomy      Current Outpatient Rx  Name  Route  Sig  Dispense  Refill  . amLODipine (NORVASC) 2.5 MG tablet   Oral   Take 2.5 mg by mouth daily.      2   . aspirin EC 81 MG tablet   Oral   Take 81 mg by mouth.         . Cetirizine HCl 10 MG CAPS   Oral   Take by mouth.         . folic acid (FOLVITE) 1 MG tablet   Oral   Take by mouth.         . hydrochlorothiazide (HYDRODIURIL) 25 MG tablet   Oral   Take 25 mg by mouth daily.      3   . levothyroxine (SYNTHROID, LEVOTHROID) 88 MCG tablet   Oral   Take 88 mcg by mouth daily.      2   . Multiple Vitamin (MULTIVITAMIN) capsule   Oral   Take by mouth.         Marland Kitchen omeprazole (PRILOSEC) 40 MG capsule   Oral   Take 40 mg by mouth.         . predniSONE (DELTASONE) 5 MG tablet            1   . ranitidine (ZANTAC) 150 MG capsule            3   . torsemide (DEMADEX) 5 MG tablet   Oral   Take 5 mg by mouth daily.      3     Allergies Dyazide; Plaquenil; Sulfate; and Codeine  No family history on file.  Social History History  Substance Use Topics  . Smoking status: Never Smoker   . Smokeless tobacco:  Never Used  . Alcohol Use: No    Review of Systems  Constitutional: Negative for fever. Eyes: Negative for visual changes. ENT: Negative for sore throat. Cardiovascular: Negative for chest pain. Respiratory: Negative for shortness of breath. Gastrointestinal: Positive for abdominal pain,  Genitourinary: Negative for dysuria. Musculoskeletal: Negative for back pain. Skin: Negative for rash. Neurological: Negative for headaches, focal weakness or numbness.   10-point ROS otherwise negative.  ____________________________________________   PHYSICAL EXAM:  VITAL SIGNS: ED Triage Vitals  Enc Vitals Group     BP 06/26/14 2355 168/94 mmHg     Pulse Rate 06/26/14 2355 101     Resp 06/26/14 2355 18     Temp 06/26/14 2355 98.4 F (36.9 C)     Temp Source 06/26/14 2355 Oral     SpO2 06/26/14 2355 96 %  Weight 06/26/14 2355 116 lb (52.617 kg)     Height 06/26/14 2355 5\' 4"  (1.626 m)     Head Cir --      Peak Flow --      Pain Score 06/26/14 2356 10     Pain Loc --      Pain Edu? --      Excl. in [place]? --      Constitutional: Alert and oriented. Well appearing and in no distress. Eyes: Conjunctivae are normal. PERRL. Normal extraocular movements. ENT   Head: Normocephalic and atraumatic.   Nose: No congestion/rhinnorhea.   Mouth/Throat: Mucous membranes are moist.   Neck: No stridor. Hematological/Lymphatic/Immunilogical: No cervical lymphadenopathy. Cardiovascular: Normal rate, regular rhythm. Normal and symmetric distal pulses are present in all extremities. No murmurs, rubs, or gallops. Respiratory: Normal respiratory effort without tachypnea nor retractions. Breath sounds are clear and equal bilaterally. No wheezes/rales/rhonchi. Gastrointestinal: Right upper quadrant pain with palpation No distention. There is no CVA tenderness. Genitourinary: deferred Musculoskeletal: Nontender with normal range of motion in all extremities. No joint effusions.  No lower  extremity tenderness nor edema. Neurologic:  Normal speech and language. No gross focal neurologic deficits are appreciated. Speech is normal.  Skin:  Skin is warm, dry and intact. No rash noted. Psychiatric: Mood and affect are normal. Speech and behavior are normal. Patient exhibits appropriate insight and judgment.  ____________________________________________    LABS (pertinent positives/negatives)  Labs Reviewed  CBC WITH DIFFERENTIAL/PLATELET - Abnormal; Notable for the following:    Neutro Abs 7.7 (*)    Lymphs Abs 0.1 (*)    All other components within normal limits  COMPREHENSIVE METABOLIC PANEL - Abnormal; Notable for the following:    Potassium 3.2 (*)    Glucose, Bld 112 (*)    AST 524 (*)    ALT 377 (*)    Alkaline Phosphatase 172 (*)    Total Bilirubin 2.7 (*)    All other components within normal limits  LIPASE, BLOOD  TROPONIN I          INITIAL IMPRESSION / ASSESSMENT AND PLAN / ED COURSE  Pertinent labs & imaging results that were available during my care of the patient were reviewed by me and considered in my medical decision making (see chart for details).  History and physical exam, lab data consistent with acute cholangitis patient discussed with Dr. Gustavo Lah gastroenterologist who stated there was no one available this weekend [facility] to perform a ERCP. As such he recommended that the patient be transferred to [facility]. Patient was discussed with Dr. Paulita Fujita and Dr. Alcario Drought at [facility] who accepted the patient in transfer. Patient received Zosyn 3.375 mg in the emergency department before transfer  ____________________________________________   FINAL CLINICAL IMPRESSION(S) / ED DIAGNOSES  Final diagnoses:  Acute cholangitis      Gregor Hams, MD 06/27/14 [phone]

## 2014-06-27 NOTE — Progress Notes (Addendum)
[facility]  Betsey Mexicano was admitted early this morning as a transfer from [facility]. She is found to have a bile duct stone on MRCP, elevated LFTs and has been having right upper quadrant pain and fevers from this. She was transferred to [facility] for an ERCP. She has been evaluated by GI and plans are for ERCP tomorrow. Until then she can continue clear liquids.  Principal Problem:   Choledocholithiasis with obstruction/elevated LFTs/right upper quadrant pain -ERCP tomorrow-clear liquids today-control pain-continue Unasyn for early cholangitis Active Problems:    Rheumatoid arthritis -Continue prednisone    HTN (hypertension), benign -Continue amlodipine with holding parameters-Demadex and HCTZ are on hold\    Hypothyroidism -Continue Synthroid  Hypokalemia -Replaced-recheck tomorrow  Debbe Odea, MD

## 2014-06-27 NOTE — ED Notes (Signed)
Patient denies pain and is resting comfortably.  

## 2014-06-27 NOTE — ED Notes (Signed)
MD at bedside. 

## 2014-06-27 NOTE — H&P (Signed)
[facility] History and Physical  Amy Hess PZW:[id number] DOB: [birth year] DOA: 06/27/2014  Referring physician: EDP PCP: Kirk Ruths., MD   Chief Complaint: Abdominal pain   HPI: Amy Hess is a 81 y.o. female who presents to the ED at [facility] with c/o abdominal pain.  The patient has a known stone in her CBD which had been mobile causing intermittent symptoms but no elevation in her LFTs until today.  This was diagnosed with MRCP done yesterday.  Today however, her pain increased and worsened.  Pain is located in her RUQ, no fevers nor chills.  In the ED she was initially tachycardic, this improved with IVF.  Her pain was controlled.  Patients LFTs were noted to be newly elevated.  EDP called and spoke with Dr. Paulita Fujita regarding ERCP, Dr. Paulita Fujita asked that the [facility] be transferred [facility] and admitted to the medical service.  Review of Systems: Systems reviewed.  As above, otherwise negative  Past Medical History  Diagnosis Date  . Cancer     thyroid and mouth cancer 2013  . Hypertension   . Arthritis   . [name] (coronary artery disease)    Past Surgical History  Procedure Laterality Date  . Cholecystectomy    . Total thyroidectomy     Social History:  reports that she has never smoked. She has never used smokeless tobacco. She reports that she does not drink alcohol or use illicit drugs.  Allergies  Allergen Reactions  . Dyazide [Hydrochlorothiazide W-Triamterene] Other (See Comments)    On Chart ot MD office  . Plaquenil [Hydroxychloroquine Sulfate] Other (See Comments)    Not sure per MD office on chart   . Sulfate Other (See Comments)    Pt not sure  . Codeine Rash    No family history on file.   Prior to Admission medications   Medication Sig Start Date End Date Taking? Authorizing Provider  amLODipine (NORVASC) 2.5 MG tablet Take 2.5 mg by mouth daily. 06/16/14   Historical Provider, MD  aspirin EC 81 MG tablet Take 81 mg by mouth.     Historical Provider, MD  Cetirizine HCl 10 MG CAPS Take by mouth.    Historical Provider, MD  folic acid (FOLVITE) 1 MG tablet Take by mouth. 02/18/14   Historical Provider, MD  hydrochlorothiazide (HYDRODIURIL) 25 MG tablet Take 25 mg by mouth daily. 06/04/14   Historical Provider, MD  levothyroxine (SYNTHROID, LEVOTHROID) 88 MCG tablet Take 88 mcg by mouth daily. 06/08/14   Historical Provider, MD  Multiple Vitamin (MULTIVITAMIN) capsule Take by mouth.    Historical Provider, MD  omeprazole (PRILOSEC) 40 MG capsule Take 40 mg by mouth. 05/04/14 05/04/15  Historical Provider, MD  predniSONE (DELTASONE) 5 MG tablet  05/27/14   Historical Provider, MD  ranitidine (ZANTAC) 150 MG capsule  05/05/14   Historical Provider, MD  torsemide (DEMADEX) 5 MG tablet Take 5 mg by mouth daily. 06/04/14   Historical Provider, MD   Physical Exam: There were no vitals filed for this visit.  There were no vitals taken for this visit.  General Appearance:    Alert, oriented, no distress, appears stated age  Head:    Normocephalic, atraumatic  Eyes:    PERRL, EOMI, sclera non-icteric        Nose:   Nares without drainage or epistaxis. Mucosa, turbinates normal  Throat:   Moist mucous membranes. Oropharynx without erythema or exudate.  Neck:   Supple. No carotid bruits.  No thyromegaly.  No lymphadenopathy.   Back:     No CVA tenderness, no spinal tenderness  Lungs:     Clear to auscultation bilaterally, without wheezes, rhonchi or rales  Chest wall:    No tenderness to palpitation  Heart:    Regular rate and rhythm without murmurs, gallops, rubs  Abdomen:     Soft, non-tender, nondistended, normal bowel sounds, no organomegaly  Genitalia:    deferred  Rectal:    deferred  Extremities:   No clubbing, cyanosis or edema.  Pulses:   2+ and symmetric all extremities  Skin:   Skin color, texture, turgor normal, no rashes or lesions  Lymph nodes:   Cervical, supraclavicular, and axillary nodes normal  Neurologic:    CNII-XII intact. Normal strength, sensation and reflexes      throughout    Labs on Admission:  Basic Metabolic Panel:  Recent Labs Lab 06/27/14 0011  NA 142  K 3.2*  CL 102  CO2 29  GLUCOSE 112*  BUN 13  CREATININE 0.85  CALCIUM 9.7   Liver Function Tests:  Recent Labs Lab 06/27/14 0011  AST 524*  ALT 377*  ALKPHOS 172*  BILITOT 2.7*  PROT 7.5  ALBUMIN 4.3    Recent Labs Lab 06/27/14 0011  LIPASE 45   No results for input(s): AMMONIA in the last 168 hours. CBC:  Recent Labs Lab 06/27/14 0011  WBC 8.2  NEUTROABS 7.7*  HGB 13.4  HCT 39.8  MCV 90.8  PLT 162   Cardiac Enzymes:  Recent Labs Lab 06/27/14 0011  TROPONINI <0.03    BNP (last 3 results) No results for input(s): PROBNP in the last 8760 hours. CBG: No results for input(s): GLUCAP in the last 168 hours.  Radiological Exams on Admission: Mr Abdomen Mrcp Wo Cm  06/25/2014   CLINICAL DATA:  Right side abdominal pain off and on for 3 months. History of cholecystectomy.  EXAM: MRI ABDOMEN WITHOUT CONTRAST  (INCLUDING MRCP)  TECHNIQUE: Multiplanar multisequence MR imaging of the abdomen was performed. Heavily T2-weighted images of the biliary and pancreatic ducts were obtained, and three-dimensional MRCP images were rendered by post processing.  COMPARISON:  CT 06/09/2014  FINDINGS: Lower chest:  Lung bases are clear.  Hepatobiliary: There is mild intrahepatic biliary duct dilatation. The common hepatic duct is significantly dilated to 16 mm. The common bile duct is dilated 8 mm. Within the the proximal common bile duct there is a 7 mm filling defect (image 10, series 5). This filling defect has migrated proximally from the ampullary region on comparison CT. Patient status post cholecystectomy.  Pancreas: Pancreatic duct is not dilated. There is a periampullary duodenum diverticulum measuring approximately 2.3 cm on image 6, series 5. Body and tail the pancreas are normal. No fluid collections.  Spleen:  Normal spleen.  Adrenals/urinary tract: Adrenal glands and kidneys are normal.  Stomach/Bowel: Hiatal hernia.  Vascular/Lymphatic: Abdominal aortic normal caliber. No retroperitoneal periportal lymphadenopathy.  Musculoskeletal: No aggressive osseous lesion  IMPRESSION: 1. Choledocholithiasis with mobile stone at the junction of the common hepatic duct and common bile duct. 2. Mild intrahepatic duct dilatation and moderate common hepatic duct and common bile duct dilatation. 3. No pancreatic duct dilatation. Periampullary duodenum diverticulum noted.   Electronically Signed   By: Suzy Bouchard M.D.   On: 06/25/2014 10:29    EKG: Independently reviewed.  Assessment/Plan Principal Problem:   Choledocholithiasis with obstruction Active Problems:   Rheumatoid arthritis   HTN (hypertension), benign   Hypothyroidism   1.  Choledocholithiasis with obstruction - 1. Empiric unasyn for possible early cholangitis 2. IVF 3. GI to see patient for ERCP 4. NPO 5. SCDs only ordered for DVT ppx due to plan for ERCP. 2. HTN - continue home meds except diuretics 3. RA - continue home prednisone 4. Hypothyroidism - continue synthroid  Dr. Paulita Fujita consulted by EDP for ERCP.  Code Status: Full Code  Family Communication: Family at bedside Disposition Plan: Admit to inpatient   Time spent: 70 min  GARDNER, JARED M. [facility] Pager [phone]  If 7AM-7PM, please contact the day team taking care of the patient [email or web address] Password [email or web address] 06/27/2014, 5:55 AM

## 2014-06-27 NOTE — Progress Notes (Signed)
81yo female c/o abdominal pain and nausea, MRI shows choledocholithiasis w/ possible early cholangitis, to begin IV ABX.  Will start Unasyn 1.5g IV Q6H and monitor CBC and Cx.  Wynona Neat, PharmD, BCPS 06/27/2014 5:38 AM

## 2014-06-27 NOTE — Consult Note (Signed)
[facility] Gastroenterology Consultation Note  Referring Provider: Dr. Debbe Odea [facility]) Primary Care Physician:  Kirk Ruths., MD Primary Gastroenterologist:  None  Reason for Consultation:  Bile duct stone  HPI: Amy Hess is a 81 y.o. female admitted for bile duct stone.  Patient has history of intermittent stabbing epigastric and right upper quadrant pain for the past three months.  Pain radiates to the back.  Can be associated with nausea and vomiting.  Work up by PCP revealed CBD stone, CBD dilated and elevated LFTs.  Patient had some subjective fevers at home.  Came to emergency department and was found to be afebrile, slightly tachycardic (resolved with fluids), normotensive.  No leukocytosis but LFTs were elevated.  Abdominal pain has improved.  No blood in stools.  Is post cholecystectomy about 5 years ago; unclear if intraoperative cholangiogram was done at that time.   Past Medical History  Diagnosis Date  . Cancer     thyroid and mouth cancer 2013  . Hypertension   . Arthritis   . [name] (coronary artery disease)     Past Surgical History  Procedure Laterality Date  . Cholecystectomy    . Total thyroidectomy      Prior to Admission medications   Medication Sig Start Date End Date Taking? Authorizing Provider  amLODipine (NORVASC) 2.5 MG tablet Take 2.5 mg by mouth daily. 06/16/14   Historical Provider, MD  aspirin EC 81 MG tablet Take 81 mg by mouth.    Historical Provider, MD  Cetirizine HCl 10 MG CAPS Take by mouth.    Historical Provider, MD  folic acid (FOLVITE) 1 MG tablet Take by mouth. 02/18/14   Historical Provider, MD  levothyroxine (SYNTHROID, LEVOTHROID) 88 MCG tablet Take 88 mcg by mouth daily. 06/08/14   Historical Provider, MD  Multiple Vitamin (MULTIVITAMIN) capsule Take by mouth.    Historical Provider, MD  omeprazole (PRILOSEC) 40 MG capsule Take 40 mg by mouth. 05/04/14 05/04/15  Historical Provider, MD  predniSONE (DELTASONE) 5 MG tablet   05/27/14   Historical Provider, MD  ranitidine (ZANTAC) 150 MG capsule  05/05/14   Historical Provider, MD  torsemide (DEMADEX) 5 MG tablet Take 5 mg by mouth daily. 06/04/14   Historical Provider, MD    Current Facility-Administered Medications  Medication Dose Route Frequency Provider Last Rate Last Dose  . 0.9 %  sodium chloride infusion   Intravenous Continuous Etta Quill, DO 125 mL/hr at 06/27/14 [phone]    . amLODipine (NORVASC) tablet 5 mg  5 mg Oral Daily Etta Quill, DO      . ampicillin-sulbactam (UNASYN) 1.5 g in sodium chloride 0.9 % 50 mL IVPB  1.5 g Intravenous Q6H Veronda P Bryk, RPH      . aspirin EC tablet 81 mg  81 mg Oral Daily Etta Quill, DO      . famotidine (PEPCID) tablet 20 mg  20 mg Oral Daily Etta Quill, DO      . folic acid (FOLVITE) tablet 1 mg  1 mg Oral Daily Jared M Gardner, DO      . levothyroxine (SYNTHROID, LEVOTHROID) tablet 88 mcg  88 mcg Oral QAC breakfast Etta Quill, DO   88 mcg at 06/27/14 6314  . loratadine (CLARITIN) tablet 10 mg  10 mg Oral Daily Etta Quill, DO      . morphine 2 MG/ML injection 2-4 mg  2-4 mg Intravenous Q4H PRN Etta Quill, DO      .  pantoprazole (PROTONIX) EC tablet 80 mg  80 mg Oral Daily Etta Quill, DO      . predniSONE (DELTASONE) tablet 2.5 mg  2.5 mg Oral Q breakfast Etta Quill, DO        Allergies as of 06/27/2014 - Review Complete 06/27/2014  Allergen Reaction Noted  . Dyazide [hydrochlorothiazide w-triamterene] Other (See Comments) 06/09/2014  . Plaquenil [hydroxychloroquine sulfate] Other (See Comments) 06/09/2014  . Sulfate Other (See Comments) 06/09/2014  . Codeine Rash 06/09/2014    No family history on file.  History   Social History  . Marital Status: Widowed    Spouse Name: N/A  . Number of Children: N/A  . Years of Education: N/A   Occupational History  . Not on file.   Social History Main Topics  . Smoking status: Never Smoker   . Smokeless tobacco: Never Used   . Alcohol Use: No  . Drug Use: No  . Sexual Activity: Not on file   Other Topics Concern  . Not on file   Social History Narrative    Review of Systems: Positive = bold Gen: Denies any fever, chills, rigors, night sweats, anorexia, fatigue, weakness, malaise, involuntary weight loss, and sleep disorder CV: Denies chest pain, angina, palpitations, syncope, orthopnea, PND, peripheral edema, and claudication. Resp: Denies dyspnea, cough, sputum, wheezing, coughing up blood. GI: Described in detail in HPI.    GU : Denies urinary burning, blood in urine, urinary frequency, urinary hesitancy, nocturnal urination, and urinary incontinence. MS: Denies joint pain or swelling.  Denies muscle weakness, cramps, atrophy.  Derm: Denies rash, itching, oral ulcerations, hives, unhealing ulcers.  Psych: Denies depression, anxiety, memory loss, suicidal ideation, hallucinations,  and confusion. Heme: Denies bruising, bleeding, and enlarged lymph nodes. Neuro:  Denies any headaches, dizziness, paresthesias. Endo:  Denies any problems with DM, thyroid, adrenal function.  Physical Exam: Vital signs in last 24 hours: Temp:  [97.7 F (36.5 C)-98.4 F (36.9 C)] 97.7 F (36.5 C) (06/04 0445) Pulse Rate:  [84-121] 121 (06/04 0445) Resp:  [12-18] 17 (06/04 0445) BP: (109-168)/(59-94) 110/59 mmHg (06/04 0445) SpO2:  [96 %-99 %] 96 % (06/04 0445) Weight:  [52.617 kg (116 lb)-53.812 kg (118 lb 10.1 oz)] 53.812 kg (118 lb 10.1 oz) (06/04 0445) Last BM Date: 06/26/14 General:   Alert, pleasant, younger-appearing than stated age, thin and somewhat chronically cachectic-appearing, pleasant and cooperative in NAD Head:  Normocephalic and atraumatic. Eyes:  Sclera clear, trace scleral icterus bilaterally.   Conjunctiva pink. Ears:  Normal auditory acuity. Nose:  No deformity, discharge,  or lesions. Mouth:  No deformity or lesions.  Oropharynx pink & moist. Neck:  Supple; no masses or thyromegaly. Lungs:   Clear throughout to auscultation.   No wheezes, crackles, or rhonchi. No acute distress. Heart:  Regular rate and rhythm; no murmurs, clicks, rubs,  or gallops. Abdomen:  Soft, mild upper abdominal tenderness without peritonitis. No masses, hepatosplenomegaly or hernias noted. Normal bowel sounds, without guarding, and without rebound.     Msk:  Symmetrical without gross deformities. Normal posture. Pulses:  Normal pulses noted. Extremities:  Without clubbing or edema. Neurologic:  Alert and  oriented x4;  Diffusely weak, otherwise grossly normal neurologically. Skin:  Intact without significant lesions or rashes. Psych:  Alert and cooperative. Normal mood and affect.   Lab Results:  Recent Labs  06/27/14 0011  WBC 8.2  HGB 13.4  HCT 39.8  PLT 162   BMET  Recent Labs  06/27/14 0011  NA  142  K 3.2*  CL 102  CO2 29  GLUCOSE 112*  BUN 13  CREATININE 0.85  CALCIUM 9.7   LFT  Recent Labs  06/27/14 0011  PROT 7.5  ALBUMIN 4.3  AST 524*  ALT 377*  ALKPHOS 172*  BILITOT 2.7*   PT/INR No results for input(s): LABPROT, INR in the last 72 hours.  Studies/Results: No results found.  Impression:  1.  Dilated bile duct. 2.  Elevated LFTs. 3.  Common bile duct stone, causing #1 and #2 above.  Patient is stable at this point, has no fevers, hypotension or leukocytosis.  May have mild, but certainly not fulminant, cholangitis.  Plan:  1.  Clear liquid diet ok, NPO after midnight. 2.  Continue parenteral antibiotics today. 3.  Plan on ERCP for hopeful biliary sphincterotomy and bile duct stone extraction tomorrow. 4.  Risks (up to and including bleeding, infection, perforation, pancreatitis that can be complicated by infected necrosis and death), benefits (removal of stones, alleviating blockage, decreasing risk of cholangitis or choledocholithiasis-related pancreatitis), and alternatives (watchful waiting, percutaneous transhepatic cholangiography) of ERCP were  explained to patient/family in detail and patient elects to proceed.   LOS: 0 days   Zayneb Baucum M  06/27/2014, 11:19 AM  Pager [phone] If no answer or after 5 PM call [phone]

## 2014-06-27 NOTE — Progress Notes (Signed)
Pt a/o, no c/o pain, pt NPO at 12am for ERCP, consent in chart, VSS, pt stable, pt resting comfortably

## 2014-06-28 ENCOUNTER — Encounter (HOSPITAL_COMMUNITY)
Admission: EM | Disposition: A | Discharge status: Home / Self Care | Payer: Self-pay | Source: Other Acute Inpatient Hospital | Attending: Internal Medicine

## 2014-06-28 ENCOUNTER — Inpatient Hospital Stay (HOSPITAL_COMMUNITY): Payer: Medicare Other

## 2014-06-28 ENCOUNTER — Inpatient Hospital Stay (HOSPITAL_COMMUNITY): Payer: Medicare Other | Admitting: Anesthesiology

## 2014-06-28 DIAGNOSIS — K8031 Calculus of bile duct with cholangitis, unspecified, with obstruction: Principal | ICD-10-CM

## 2014-06-28 DIAGNOSIS — K8050 Calculus of bile duct without cholangitis or cholecystitis without obstruction: Secondary | ICD-10-CM | POA: Diagnosis present

## 2014-06-28 DIAGNOSIS — E038 Other specified hypothyroidism: Secondary | ICD-10-CM

## 2014-06-28 HISTORY — PX: ERCP: SHX5425

## 2014-06-28 LAB — BASIC METABOLIC PANEL
ANION GAP: 11 (ref 5–15)
BUN: 5 mg/dL — ABNORMAL LOW (ref 6–20)
CO2: 23 mmol/L (ref 22–32)
Calcium: 8.7 mg/dL — ABNORMAL LOW (ref 8.9–10.3)
Chloride: 106 mmol/L (ref 101–111)
Creatinine, Ser: 0.78 mg/dL (ref 0.44–1.00)
GFR calc Af Amer: >60 mL/min (ref >60)
GFR calc non Af Amer: >60 mL/min (ref >60)
Glucose, Bld: 92 mg/dL (ref 65–99)
Potassium: 3.2 mmol/L — ABNORMAL LOW (ref 3.5–5.1)
SODIUM: 140 mmol/L (ref 135–145)

## 2014-06-28 LAB — HEPATIC FUNCTION PANEL
ALK PHOS: 160 U/L — AB (ref 38–126)
ALT: 217 U/L — AB (ref 14–54)
AST: 165 U/L — ABNORMAL HIGH (ref 15–41)
Albumin: 3.7 g/dL (ref 3.5–5.0)
BILIRUBIN INDIRECT: 0.9 mg/dL (ref 0.3–0.9)
Bilirubin, Direct: 0.6 mg/dL — ABNORMAL HIGH (ref 0.1–0.5)
TOTAL PROTEIN: 6.8 g/dL (ref 6.5–8.1)
Total Bilirubin: 1.5 mg/dL — ABNORMAL HIGH (ref 0.3–1.2)

## 2014-06-28 LAB — CBC
HCT: 39.7 % (ref 36.0–46.0)
HEMOGLOBIN: 12.9 g/dL (ref 12.0–15.0)
MCH: 29.7 pg (ref 26.0–34.0)
MCHC: 32.5 g/dL (ref 30.0–36.0)
MCV: 91.3 fL (ref 78.0–100.0)
Platelets: 173 10*3/uL (ref 150–400)
RBC: 4.35 MIL/uL (ref 3.87–5.11)
RDW: 13.7 % (ref 11.5–15.5)
WBC: 7.6 10*3/uL (ref 4.0–10.5)

## 2014-06-28 LAB — MAGNESIUM: MAGNESIUM: 1.9 mg/dL (ref 1.7–2.4)

## 2014-06-28 SURGERY — ERCP, WITH INTERVENTION IF INDICATED
Anesthesia: General

## 2014-06-28 MED ORDER — FENTANYL CITRATE (PF) 100 MCG/2ML IJ SOLN
INTRAMUSCULAR | Status: DC | PRN
  Administered 2014-06-28: 50 ug via INTRAVENOUS

## 2014-06-28 MED ORDER — PHENYLEPHRINE HCL 10 MG/ML IJ SOLN
INTRAMUSCULAR | Status: DC | PRN
  Administered 2014-06-28 (×2): 40 ug via INTRAVENOUS

## 2014-06-28 MED ORDER — LIDOCAINE HCL (CARDIAC) 20 MG/ML IV SOLN
INTRAVENOUS | Status: DC | PRN
  Administered 2014-06-28: 80 mg via INTRAVENOUS

## 2014-06-28 MED ORDER — SODIUM CHLORIDE 0.9 % IV SOLN: INTRAVENOUS | Status: DC

## 2014-06-28 MED ORDER — ONDANSETRON HCL 4 MG/2ML IJ SOLN
4.0000 mg | Freq: Four times a day (QID) | INTRAMUSCULAR | Status: DC | PRN
  Administered 2014-06-28: 4 mg via INTRAVENOUS

## 2014-06-28 MED ORDER — NEOSTIGMINE METHYLSULFATE 10 MG/10ML IV SOLN
INTRAVENOUS | Status: DC | PRN
  Administered 2014-06-28: 3 mg via INTRAVENOUS

## 2014-06-28 MED ORDER — ONDANSETRON HCL 4 MG/2ML IJ SOLN
INTRAMUSCULAR | Status: DC | PRN
  Administered 2014-06-28: 4 mg via INTRAVENOUS

## 2014-06-28 MED ORDER — PROPOFOL 10 MG/ML IV BOLUS
INTRAVENOUS | Status: DC | PRN
  Administered 2014-06-28: 60 mg via INTRAVENOUS
  Administered 2014-06-28: 40 mg via INTRAVENOUS

## 2014-06-28 MED ORDER — SODIUM CHLORIDE 0.9 % IV SOLN
INTRAVENOUS | Status: DC | PRN
  Administered 2014-06-28: 30 mL

## 2014-06-28 MED ORDER — PANTOPRAZOLE SODIUM 40 MG PO TBEC
40.0000 mg | DELAYED_RELEASE_TABLET | Freq: Every day | ORAL | Status: DC
  Administered 2014-06-28: 40 mg via ORAL

## 2014-06-28 MED ORDER — POTASSIUM CHLORIDE 10 MEQ/100ML IV SOLN
10.0000 meq | INTRAVENOUS | Status: AC
  Administered 2014-06-28 (×4): 10 meq via INTRAVENOUS
  Filled 2014-06-28 (×4): qty 100

## 2014-06-28 MED ORDER — PHENYLEPHRINE HCL 10 MG/ML IJ SOLN
10.0000 mg | INTRAVENOUS | Status: DC | PRN
  Administered 2014-06-28: 50 ug/min via INTRAVENOUS

## 2014-06-28 MED ORDER — PROMETHAZINE HCL 25 MG/ML IJ SOLN
6.2500 mg | INTRAMUSCULAR | Status: DC | PRN
Start: 2014-06-28 — End: 2014-06-28

## 2014-06-28 MED ORDER — GLYCOPYRROLATE 0.2 MG/ML IJ SOLN
INTRAMUSCULAR | Status: DC | PRN
  Administered 2014-06-28: .4 mg via INTRAVENOUS

## 2014-06-28 MED ORDER — FENTANYL CITRATE (PF) 100 MCG/2ML IJ SOLN: 25.0000 ug | INTRAMUSCULAR | Status: DC | PRN

## 2014-06-28 MED ORDER — ROCURONIUM BROMIDE 100 MG/10ML IV SOLN
INTRAVENOUS | Status: DC | PRN
  Administered 2014-06-28: 30 mg via INTRAVENOUS

## 2014-06-28 MED ORDER — PHENYLEPHRINE HCL 10 MG/ML IJ SOLN: 10.0000 mg | INTRAVENOUS | Status: DC | PRN

## 2014-06-28 MED ORDER — LACTATED RINGERS IV SOLN
INTRAVENOUS | Status: DC | PRN
  Administered 2014-06-28: 11:00:00 via INTRAVENOUS

## 2014-06-28 NOTE — H&P (View-Only) (Signed)
[facility] Gastroenterology Consultation Note  Referring Provider: Dr. Debbe Odea [facility]) Primary Care Physician:  Kirk Ruths., MD Primary Gastroenterologist:  None  Reason for Consultation:  Bile duct stone  HPI: Amy Hess is a 81 y.o. female admitted for bile duct stone.  Patient has history of intermittent stabbing epigastric and right upper quadrant pain for the past three months.  Pain radiates to the back.  Can be associated with nausea and vomiting.  Work up by PCP revealed CBD stone, CBD dilated and elevated LFTs.  Patient had some subjective fevers at home.  Came to emergency department and was found to be afebrile, slightly tachycardic (resolved with fluids), normotensive.  No leukocytosis but LFTs were elevated.  Abdominal pain has improved.  No blood in stools.  Is post cholecystectomy about 5 years ago; unclear if intraoperative cholangiogram was done at that time.   Past Medical History  Diagnosis Date  . Cancer     thyroid and mouth cancer 2013  . Hypertension   . Arthritis   . [name] (coronary artery disease)     Past Surgical History  Procedure Laterality Date  . Cholecystectomy    . Total thyroidectomy      Prior to Admission medications   Medication Sig Start Date End Date Taking? Authorizing Provider  amLODipine (NORVASC) 2.5 MG tablet Take 2.5 mg by mouth daily. 06/16/14   Historical Provider, MD  aspirin EC 81 MG tablet Take 81 mg by mouth.    Historical Provider, MD  Cetirizine HCl 10 MG CAPS Take by mouth.    Historical Provider, MD  folic acid (FOLVITE) 1 MG tablet Take by mouth. 02/18/14   Historical Provider, MD  levothyroxine (SYNTHROID, LEVOTHROID) 88 MCG tablet Take 88 mcg by mouth daily. 06/08/14   Historical Provider, MD  Multiple Vitamin (MULTIVITAMIN) capsule Take by mouth.    Historical Provider, MD  omeprazole (PRILOSEC) 40 MG capsule Take 40 mg by mouth. 05/04/14 05/04/15  Historical Provider, MD  predniSONE (DELTASONE) 5 MG tablet   05/27/14   Historical Provider, MD  ranitidine (ZANTAC) 150 MG capsule  05/05/14   Historical Provider, MD  torsemide (DEMADEX) 5 MG tablet Take 5 mg by mouth daily. 06/04/14   Historical Provider, MD    Current Facility-Administered Medications  Medication Dose Route Frequency Provider Last Rate Last Dose  . 0.9 %  sodium chloride infusion   Intravenous Continuous Etta Quill, DO 125 mL/hr at 06/27/14 [phone]    . amLODipine (NORVASC) tablet 5 mg  5 mg Oral Daily Etta Quill, DO      . ampicillin-sulbactam (UNASYN) 1.5 g in sodium chloride 0.9 % 50 mL IVPB  1.5 g Intravenous Q6H Veronda P Bryk, RPH      . aspirin EC tablet 81 mg  81 mg Oral Daily Etta Quill, DO      . famotidine (PEPCID) tablet 20 mg  20 mg Oral Daily Etta Quill, DO      . folic acid (FOLVITE) tablet 1 mg  1 mg Oral Daily Jared M Gardner, DO      . levothyroxine (SYNTHROID, LEVOTHROID) tablet 88 mcg  88 mcg Oral QAC breakfast Etta Quill, DO   88 mcg at 06/27/14 8841  . loratadine (CLARITIN) tablet 10 mg  10 mg Oral Daily Etta Quill, DO      . morphine 2 MG/ML injection 2-4 mg  2-4 mg Intravenous Q4H PRN Etta Quill, DO      .  pantoprazole (PROTONIX) EC tablet 80 mg  80 mg Oral Daily Etta Quill, DO      . predniSONE (DELTASONE) tablet 2.5 mg  2.5 mg Oral Q breakfast Etta Quill, DO        Allergies as of 06/27/2014 - Review Complete 06/27/2014  Allergen Reaction Noted  . Dyazide [hydrochlorothiazide w-triamterene] Other (See Comments) 06/09/2014  . Plaquenil [hydroxychloroquine sulfate] Other (See Comments) 06/09/2014  . Sulfate Other (See Comments) 06/09/2014  . Codeine Rash 06/09/2014    No family history on file.  History   Social History  . Marital Status: Widowed    Spouse Name: N/A  . Number of Children: N/A  . Years of Education: N/A   Occupational History  . Not on file.   Social History Main Topics  . Smoking status: Never Smoker   . Smokeless tobacco: Never Used   . Alcohol Use: No  . Drug Use: No  . Sexual Activity: Not on file   Other Topics Concern  . Not on file   Social History Narrative    Review of Systems: Positive = bold Gen: Denies any fever, chills, rigors, night sweats, anorexia, fatigue, weakness, malaise, involuntary weight loss, and sleep disorder CV: Denies chest pain, angina, palpitations, syncope, orthopnea, PND, peripheral edema, and claudication. Resp: Denies dyspnea, cough, sputum, wheezing, coughing up blood. GI: Described in detail in HPI.    GU : Denies urinary burning, blood in urine, urinary frequency, urinary hesitancy, nocturnal urination, and urinary incontinence. MS: Denies joint pain or swelling.  Denies muscle weakness, cramps, atrophy.  Derm: Denies rash, itching, oral ulcerations, hives, unhealing ulcers.  Psych: Denies depression, anxiety, memory loss, suicidal ideation, hallucinations,  and confusion. Heme: Denies bruising, bleeding, and enlarged lymph nodes. Neuro:  Denies any headaches, dizziness, paresthesias. Endo:  Denies any problems with DM, thyroid, adrenal function.  Physical Exam: Vital signs in last 24 hours: Temp:  [97.7 F (36.5 C)-98.4 F (36.9 C)] 97.7 F (36.5 C) (06/04 0445) Pulse Rate:  [84-121] 121 (06/04 0445) Resp:  [12-18] 17 (06/04 0445) BP: (109-168)/(59-94) 110/59 mmHg (06/04 0445) SpO2:  [96 %-99 %] 96 % (06/04 0445) Weight:  [52.617 kg (116 lb)-53.812 kg (118 lb 10.1 oz)] 53.812 kg (118 lb 10.1 oz) (06/04 0445) Last BM Date: 06/26/14 General:   Alert, pleasant, younger-appearing than stated age, thin and somewhat chronically cachectic-appearing, pleasant and cooperative in NAD Head:  Normocephalic and atraumatic. Eyes:  Sclera clear, trace scleral icterus bilaterally.   Conjunctiva pink. Ears:  Normal auditory acuity. Nose:  No deformity, discharge,  or lesions. Mouth:  No deformity or lesions.  Oropharynx pink & moist. Neck:  Supple; no masses or thyromegaly. Lungs:   Clear throughout to auscultation.   No wheezes, crackles, or rhonchi. No acute distress. Heart:  Regular rate and rhythm; no murmurs, clicks, rubs,  or gallops. Abdomen:  Soft, mild upper abdominal tenderness without peritonitis. No masses, hepatosplenomegaly or hernias noted. Normal bowel sounds, without guarding, and without rebound.     Msk:  Symmetrical without gross deformities. Normal posture. Pulses:  Normal pulses noted. Extremities:  Without clubbing or edema. Neurologic:  Alert and  oriented x4;  Diffusely weak, otherwise grossly normal neurologically. Skin:  Intact without significant lesions or rashes. Psych:  Alert and cooperative. Normal mood and affect.   Lab Results:  Recent Labs  06/27/14 0011  WBC 8.2  HGB 13.4  HCT 39.8  PLT 162   BMET  Recent Labs  06/27/14 0011  NA  142  K 3.2*  CL 102  CO2 29  GLUCOSE 112*  BUN 13  CREATININE 0.85  CALCIUM 9.7   LFT  Recent Labs  06/27/14 0011  PROT 7.5  ALBUMIN 4.3  AST 524*  ALT 377*  ALKPHOS 172*  BILITOT 2.7*   PT/INR No results for input(s): LABPROT, INR in the last 72 hours.  Studies/Results: No results found.  Impression:  1.  Dilated bile duct. 2.  Elevated LFTs. 3.  Common bile duct stone, causing #1 and #2 above.  Patient is stable at this point, has no fevers, hypotension or leukocytosis.  May have mild, but certainly not fulminant, cholangitis.  Plan:  1.  Clear liquid diet ok, NPO after midnight. 2.  Continue parenteral antibiotics today. 3.  Plan on ERCP for hopeful biliary sphincterotomy and bile duct stone extraction tomorrow. 4.  Risks (up to and including bleeding, infection, perforation, pancreatitis that can be complicated by infected necrosis and death), benefits (removal of stones, alleviating blockage, decreasing risk of cholangitis or choledocholithiasis-related pancreatitis), and alternatives (watchful waiting, percutaneous transhepatic cholangiography) of ERCP were  explained to patient/family in detail and patient elects to proceed.   LOS: 0 days   Margareth Kanner M  06/27/2014, 11:19 AM  Pager [phone] If no answer or after 5 PM call [phone]

## 2014-06-28 NOTE — Interval H&P Note (Signed)
History and Physical Interval Note:  06/28/2014 11:05 AM  Amy Hess  has presented today for surgery, with the diagnosis of Bile stones, Elevated LFT's  The various methods of treatment have been discussed with the patient and family. After consideration of risks, benefits and other options for treatment, the patient has consented to  Procedure(s): ENDOSCOPIC RETROGRADE CHOLANGIOPANCREATOGRAPHY (ERCP) (N/A) as a surgical intervention .  The patient's history has been reviewed, patient examined, no change in status, stable for surgery.  I have reviewed the patient's chart and labs.  Questions were answered to the patient's satisfaction.     [name] M  Assessment:  1.  Dilated bile duct. 2.  Elevated LFTs. 3.  Bile duct stone on recent MRCP.  Plan:  1.  Endoscopic retrograde cholangiopancreatography with possible biliary sphincterotomy, possible bile duct stone extraction. 2.  Risks (up to and including bleeding, infection, perforation, pancreatitis that can be complicated by infected necrosis and death), benefits (removal of stones, alleviating blockage, decreasing risk of cholangitis or choledocholithiasis-related pancreatitis), and alternatives (watchful waiting, percutaneous transhepatic cholangiography) of ERCP were explained to patient/family in detail and patient elects to proceed.

## 2014-06-28 NOTE — Op Note (Signed)
[facility] [place], [id number]   ERCP PROCEDURE REPORT        EXAM DATE: 06/28/2014  PATIENT NAME:          Amy Hess, Amy Hess          MR #: [id number] BIRTHDATE:       15-Dec-1928     VISIT #:     [phone] ATTENDING:     Arta Silence, MD     STATUS:     inpatient REFERRING MD:       [facility]  INDICATIONS:  The patient is a 81 yr old female here for an ERCP due to bile duct stone. PROCEDURE PERFORMED:     ERCP with sphincterotomy/papillotomy ERCP with removal of calculus/calculi ERCP with stent placement ERCP with destruction of calculus/calculi MEDICATIONS:     Per Anesthesia (General endotracheal anesthesia); Unasyn is bing given IV q 6 hours on floor  CONSENT: The patient understands the risks and benefits of the procedure and understands that these risks include, but are not limited to: sedation, allergic reaction, infection, perforation and/or bleeding. Alternative means of evaluation and treatment include, among others: physical exam, x-rays, and/or surgical intervention. The patient elects to proceed with this endoscopic procedure.  DESCRIPTION OF PROCEDURE: Informed was verified, confirmed and timeout was successfully executed by the treatment team. With the patient in left semi-prone position, medications were administered intravenously.The side-viewing duodenoscope      was passed from the mouth into the esophagus and further advanced from the esophagus into the stomach. From stomach scope was directed to the second portion of the duodenum.  Major papilla was aligned with the duodenoscope. Ampulla located along rim of large duodenal diverticulum.  Ampullary orifice was a bit patulous, suggestive of possible prior stone passage, or attempted stone passage.  Pancreatic duct preferentially cannulated, necessitating placement of 5Fr, 5cm pigtail pancreatic duct stent.  Bile duct was cannulated over  the stent; the bile duct was sigmoid-shaped, angulated, and diffusely dilated.  Solitary stone seen at junction of common hepatic and common bile duct, measuring about 1cm in diameter.  Stone was mechanically crushed in part with lithotripsy basket; several large stone fragments were removed.  Post-occlusion cholangiogram showed no residual stone fragments; bile flow through ampulla was brisk post-procedure.      ADVERSE EVENT:     None IMPRESSIONS:     As above.  Successful removal of large bile duct stone.  RECOMMENDATIONS:     1.  Watch for potential complications of procedure. 2.  No ASA/NSAIDs x 5 days post-papillotomy. 3.  Clear liquid diet, advance as tolerated. 4.  If tolerates advance in diet and no obvious post-procedural troubles, consider discharge home tomorrow from GI perspective. Needs outpatient abdominal xray in 4-6 weeks to ensure pancreatic duct stent passage. REPEAT EXAM:   ___________________________________ Arta Silence, MD eSigned:  Arta Silence, MD 06/28/2014 12:38 PM   cc:

## 2014-06-28 NOTE — Anesthesia Postprocedure Evaluation (Signed)
  Anesthesia Post-op Note  Patient: Amy Hess  Procedure(s) Performed: Procedure(s) (LRB): ENDOSCOPIC RETROGRADE CHOLANGIOPANCREATOGRAPHY (ERCP) (N/A)  Patient Location: PACU  Anesthesia Type: General  Level of Consciousness: awake and alert   Airway and Oxygen Therapy: Patient Spontanous Breathing  Post-op Pain: mild  Post-op Assessment: Post-op Vital signs reviewed, Patient's Cardiovascular Status Stable, Respiratory Function Stable, Patent Airway and No signs of Nausea or vomiting  Last Vitals:  Filed Vitals:   06/28/14 1239  BP: 145/84  Pulse: 82  Temp: 36.4 C  Resp:     Post-op Vital Signs: stable   Complications: No apparent anesthesia complications

## 2014-06-28 NOTE — Anesthesia Procedure Notes (Signed)
Procedure Name: Intubation Date/Time: 06/28/2014 11:12 AM Performed by: Lelon Perla A Pre-anesthesia Checklist: Patient identified, Suction available and Patient being monitored Patient Re-evaluated:Patient Re-evaluated prior to inductionOxygen Delivery Method: Circle system utilized Preoxygenation: Pre-oxygenation with 100% oxygen Intubation Type: IV induction Ventilation: Mask ventilation without difficulty Laryngoscope Size: Miller, 2, Mac and 3 Tube type: Oral Tube size: 7.0 mm Airway Equipment and Method: Stylet and Bougie stylet Placement Confirmation: ETT inserted through vocal cords under direct vision and positive ETCO2 Secured at: 22 cm Tube secured with: Tape Comments: DL X 2: Miller 2/ Bougie Stylet- Kaj Vasil CRNA->  esophageal intubation -recognized immediately; ventilates easily with mask DL X1 : MAC 3- Rose MD-> Grade1 view- intubated/ +BS,  + ETCO2

## 2014-06-28 NOTE — Transfer of Care (Signed)
Immediate Anesthesia Transfer of Care Note  Patient: Amy Hess  Procedure(s) Performed: Procedure(s): ENDOSCOPIC RETROGRADE CHOLANGIOPANCREATOGRAPHY (ERCP) (N/A)  Patient Location: PACU  Anesthesia Type:General  Level of Consciousness: awake and oriented  Airway & Oxygen Therapy: Patient Spontanous Breathing and Patient connected to nasal cannula oxygen  Post-op Assessment: Report given to RN and Post -op Vital signs reviewed and stable  Post vital signs: Reviewed and stable  Last Vitals:  Filed Vitals:   06/28/14 1239  BP: 145/84  Pulse: 82  Temp: 36.4 C  Resp:     Complications: No apparent anesthesia complications

## 2014-06-28 NOTE — Progress Notes (Addendum)
[facility] Progress Note   Amy Hess RJJ:[id number] DOB: [birth year] DOA: 06/27/2014 PCP: Kirk Ruths., MD  Brief narrative: Amy Hess is a 81 y.o. female with past medical history of rheumatoid arthritis on chronic prednisone, hypertension, hypothyroidism who presented to [facility] for right upper quadrant pain. On MRCP she was found to have a stone at the junction of the hepatic duct with CBD. She was transferred to [facility] for an ERCP.   Subjective: She had another episode of sharp left upper quadrant abdominal pain with nausea in the middle of the night. No vomiting.  Assessment/Plan: Principal Problem:   Choledocholithiasis with obstruction/right upper quadrant pain/elevated LFTs -Will undergo ERCP today -LFTs improving - Continue Unasyn  Active Problems:   Rheumatoid arthritis -Controlled on 2.5 mg of prednisone which we are continuing    HTN (hypertension), benign -Continue amlodipine with holding parameters-Demadex and HCTZ on hold to prevent dehydration while she has minimal by mouth intake    Hypothyroidism -Continue Synthroid  Hypokalemia -Replace potassium and recheck tomorrow-Mg+ normal   Code Status: Full code Family Communication:  Disposition Plan: ERCP today DVT prophylaxis: SCDs Consultants: GI Procedures:  Antibiotics: Anti-infectives    Start     Dose/Rate Route Frequency Ordered Stop   06/27/14 1200  ampicillin-sulbactam (UNASYN) 1.5 g in sodium chloride 0.9 % 50 mL IVPB     1.5 g 100 mL/hr over 30 Minutes Intravenous Every 6 hours 06/27/14 0538        Objective: Filed Weights   06/27/14 0445  Weight: 53.812 kg (118 lb 10.1 oz)    Intake/Output Summary (Last 24 hours) at 06/28/14 1131 Last data filed at 06/28/14 0530  Gross per 24 hour  Intake 3377.91 ml  Output      5 ml  Net 3372.91 ml     Vitals Filed Vitals:   06/27/14 2118 06/28/14 0519 06/28/14 0954 06/28/14 1030   BP: 131/83 144/78 151/91 150/86  Pulse: 76 77 85 84  Temp: 98.4 F (36.9 C) 98 F (36.7 C) 97.6 F (36.4 C) 97.6 F (36.4 C)  TempSrc: Oral Oral Oral Oral  Resp: 17 16 20 15   Height:      Weight:      SpO2: 100% 100% 100% 100%    Exam:  General:  Pt is alert, not in acute distress  HEENT: No icterus, No thrush, oral mucosa moist  Cardiovascular: regular rate and rhythm, S1/S2 No murmur  Respiratory: clear to auscultation bilaterally   Abdomen: Soft, +Bowel sounds, mild right upper quadrant tenderness, non distended, no guarding  MSK: No LE edema, cyanosis or clubbing  Data Reviewed: Basic Metabolic Panel:  Recent Labs Lab 06/27/14 0011 06/28/14 0430 06/28/14 0830  NA 142 140  --   K 3.2* 3.2*  --   CL 102 106  --   CO2 29 23  --   GLUCOSE 112* 92  --   BUN 13 5*  --   CREATININE 0.85 0.78  --   CALCIUM 9.7 8.7*  --   MG  --   --  1.9   Liver Function Tests:  Recent Labs Lab 06/27/14 0011 06/28/14 0830  AST 524* 165*  ALT 377* 217*  ALKPHOS 172* 160*  BILITOT 2.7* 1.5*  PROT 7.5 6.8  ALBUMIN 4.3 3.7    Recent Labs Lab 06/27/14 0011  LIPASE 45   No results for input(s): AMMONIA in the last 168 hours. CBC:  Recent Labs Lab 06/27/14 0011 06/28/14  0430  WBC 8.2 7.6  NEUTROABS 7.7*  --   HGB 13.4 12.9  HCT 39.8 39.7  MCV 90.8 91.3  PLT 162 173   Cardiac Enzymes:  Recent Labs Lab 06/27/14 0011  TROPONINI <0.03   BNP (last 3 results) No results for input(s): BNP in the last 8760 hours.  ProBNP (last 3 results) No results for input(s): PROBNP in the last 8760 hours.  CBG: No results for input(s): GLUCAP in the last 168 hours.  No results found for this or any previous visit (from the past 240 hour(s)).   Studies: No results found.  Scheduled Meds:  Scheduled Meds: . amLODipine  5 mg Oral Daily  . ampicillin-sulbactam (UNASYN) IV  1.5 g Intravenous Q6H  . aspirin EC  81 mg Oral Daily  . famotidine  20 mg Oral Daily  .  folic acid  1 mg Oral Daily  . levothyroxine  88 mcg Oral QAC breakfast  . loratadine  10 mg Oral Daily  . pantoprazole  80 mg Oral Daily  . potassium chloride  10 mEq Intravenous Q1 Hr x 4  . predniSONE  2.5 mg Oral Q breakfast   Continuous Infusions: . sodium chloride 125 mL/hr at 06/28/14 0756    Time spent on care of this patient: 5 minutes min   Dellroy, MD 06/28/2014, 11:31 AM  LOS: 1 day   [facility] Office  [phone] Pager - Text Page per [email or web address] If 7PM-7AM, please contact night-coverage [email or web address]

## 2014-06-28 NOTE — Anesthesia Preprocedure Evaluation (Signed)
Anesthesia Evaluation  Patient identified by MRN, date of birth, ID band Patient awake    Reviewed: Allergy & Precautions, NPO status , Patient's Chart, lab work & pertinent test results  Airway Mallampati: II  TM Distance: >3 FB Neck ROM: Full    Dental no notable dental hx.    Pulmonary neg pulmonary ROS,  breath sounds clear to auscultation  Pulmonary exam normal       Cardiovascular hypertension, Pt. on medications Normal cardiovascular examRhythm:Regular Rate:Normal     Neuro/Psych negative neurological ROS  negative psych ROS   GI/Hepatic negative GI ROS, Neg liver ROS,   Endo/Other  Hypothyroidism   Renal/GU negative Renal ROS  negative genitourinary   Musculoskeletal  (+) Arthritis -, Rheumatoid disorders,    Abdominal   Peds negative pediatric ROS (+)  Hematology negative hematology ROS (+)   Anesthesia Other Findings   Reproductive/Obstetrics negative OB ROS                             Anesthesia Physical Anesthesia Plan  ASA: III and emergent  Anesthesia Plan: General   Post-op Pain Management:    Induction: Intravenous  Airway Management Planned: Oral ETT  Additional Equipment:   Intra-op Plan:   Post-operative Plan: Extubation in OR  Informed Consent: I have reviewed the patients History and Physical, chart, labs and discussed the procedure including the risks, benefits and alternatives for the proposed anesthesia with the patient or authorized representative who has indicated his/her understanding and acceptance.   Dental advisory given  Plan Discussed with: CRNA and Surgeon  Anesthesia Plan Comments:         Anesthesia Quick Evaluation

## 2014-06-28 NOTE — Progress Notes (Signed)
Dentures and eyeglasses returned to pt

## 2014-06-29 ENCOUNTER — Encounter (HOSPITAL_COMMUNITY): Payer: Self-pay | Admitting: Surgery

## 2014-06-29 LAB — COMPREHENSIVE METABOLIC PANEL
ALT: 138 U/L — AB (ref 14–54)
AST: 74 U/L — ABNORMAL HIGH (ref 15–41)
Albumin: 3.2 g/dL — ABNORMAL LOW (ref 3.5–5.0)
Alkaline Phosphatase: 130 U/L — ABNORMAL HIGH (ref 38–126)
Anion gap: 10 (ref 5–15)
CALCIUM: 8.2 mg/dL — AB (ref 8.9–10.3)
CO2: 23 mmol/L (ref 22–32)
CREATININE: 0.67 mg/dL (ref 0.44–1.00)
Chloride: 108 mmol/L (ref 101–111)
GFR calc Af Amer: >60 mL/min (ref >60)
GFR calc non Af Amer: >60 mL/min (ref >60)
Glucose, Bld: 73 mg/dL (ref 65–99)
POTASSIUM: 3 mmol/L — AB (ref 3.5–5.1)
Sodium: 141 mmol/L (ref 135–145)
TOTAL PROTEIN: 6.3 g/dL — AB (ref 6.5–8.1)
Total Bilirubin: 0.9 mg/dL (ref 0.3–1.2)

## 2014-06-29 MED ORDER — POTASSIUM CHLORIDE CRYS ER 20 MEQ PO TBCR: 40.0000 meq | EXTENDED_RELEASE_TABLET | Freq: Once | ORAL | Status: DC

## 2014-06-29 MED ORDER — AMOXICILLIN-POT CLAVULANATE 875-125 MG PO TABS: 1.0000 | ORAL_TABLET | Freq: Two times a day (BID) | ORAL | Status: DC

## 2014-06-29 MED ORDER — POTASSIUM CHLORIDE CRYS ER 20 MEQ PO TBCR
40.0000 meq | EXTENDED_RELEASE_TABLET | ORAL | Status: DC
  Administered 2014-06-29: 40 meq via ORAL
  Filled 2014-06-29: qty 2

## 2014-06-29 NOTE — Discharge Summary (Signed)
Physician Discharge Summary  Amy Hess [id number] DOB: April 15, 1928 DOA: 06/27/2014  PCP: Kirk Ruths., MD  Admit date: 06/27/2014 Discharge date: 06/29/2014  Time spent: 50 minutes    Discharge Condition: Stable Diet recommendation: Low sodium heart healthy  Discharge Diagnoses:  Principal Problem:   Choledocholithiasis with obstruction Active Problems:   Rheumatoid arthritis   HTN (hypertension), benign   Hypothyroidism   Choledocholithiasis   History of present illness:  Amy Hess is a 81 y.o. female with past medical history of rheumatoid arthritis on chronic prednisone, hypertension, hypothyroidism who presented to [facility] for right upper quadrant pain. On MRCP she was found to have a stone at the junction of the hepatic duct with CBD. She was transferred to [facility] for an ERCP.  Hospital Course:  Principal Problem:  Choledocholithiasis with obstruction/right upper quadrant pain/elevated LFTs -Underwent ERCP on 6/5 with sphincterotomy and papillotomy,removal of calculus and stent placement -LFTs improving -Unasyn transitioned to Augmentin on discharge -Recommended to hold aspirin and not take any NSAIDs for 5 days -Is to follow-up with GI in 2-3 weeks  Active Problems:  Rheumatoid arthritis -Controlled on 2.5 mg of prednisone which we are continuing   HTN (hypertension), benign -Demadex and HCTZ were on hold to prevent dehydration while she has minimal by mouth intake -Resumed on discharge   Hypothyroidism -Continue Synthroid  Hypokalemia -Replaced --Mg+ normal  Procedures:  ERCP  Consultations:  GI  Discharge Exam: Filed Weights   06/27/14 0445  Weight: 53.812 kg (118 lb 10.1 oz)   Filed Vitals:   06/29/14 0547  BP: 142/74  Pulse: 77  Temp: 98.2 F (36.8 C)  Resp: 16    General: AAO x 3, no distress Cardiovascular: RRR, no murmurs  Respiratory: clear to auscultation bilaterally GI:  soft, non-tender, non-distended, bowel sound positive  Discharge Instructions You were cared for by a hospitalist during your hospital stay. If you have any questions about your discharge medications or the care you received while you were in the hospital after you are discharged, you can call the unit and asked to speak with the hospitalist on call if the hospitalist that took care of you is not available. Once you are discharged, your primary care physician will handle any further medical issues. Please note that NO REFILLS for any discharge medications will be authorized once you are discharged, as it is imperative that you return to your primary care physician (or establish a relationship with a primary care physician if you do not have one) for your aftercare needs so that they can reassess your need for medications and monitor your lab values.  Discharge Instructions    Diet - low sodium heart healthy    Complete by:  As directed      Discharge instructions    Complete by:  As directed   Do not take your aspirin for 5 days.     Increase activity slowly    Complete by:  As directed             Medication List    STOP taking these medications        aspirin EC 81 MG tablet      TAKE these medications        amLODipine 5 MG tablet  Commonly known as:  NORVASC  Take 5 mg by mouth daily.     amoxicillin-clavulanate 875-125 MG per tablet  Commonly known as:  AUGMENTIN  Take 1 tablet by mouth 2 (  two) times daily.     Cetirizine HCl 10 MG Caps  Take 10 mg by mouth daily.     folic acid 1 MG tablet  Commonly known as:  FOLVITE  Take 1 mg by mouth daily.     KLOR-CON M10 10 MEQ tablet  Generic drug:  potassium chloride  Take 10 mEq by mouth daily.     levothyroxine 88 MCG tablet  Commonly known as:  SYNTHROID, LEVOTHROID  Take 88 mcg by mouth daily.     multivitamin capsule  Take 1 capsule by mouth daily.     omeprazole 40 MG capsule  Commonly known as:  PRILOSEC   Take 40 mg by mouth daily.     predniSONE 5 MG tablet  Commonly known as:  DELTASONE  Take 2.5 mg by mouth daily with breakfast.     torsemide 5 MG tablet  Commonly known as:  DEMADEX  Take 5 mg by mouth daily.       Allergies  Allergen Reactions  . Dyazide [Hydrochlorothiazide W-Triamterene] Other (See Comments)    On Chart ot MD office  . Plaquenil [Hydroxychloroquine Sulfate] Other (See Comments)    Not sure per MD office on chart   . Sulfate Other (See Comments)    Pt not sure  . Codeine Rash       Follow-up Information    Follow up with Landry Dyke, MD.   Specialty:  Gastroenterology   Contact information:   [address] [place] [address] [phone]       Follow up with Kirk Ruths., MD In 1 week.   Specialty:  Internal Medicine   Why:  have postassium checked    Contact information:   [place] [address] [phone]        The results of significant diagnostics from this hospitalization (including imaging, microbiology, ancillary and laboratory) are listed below for reference.    Significant Diagnostic Studies: Ct Abdomen Pelvis W Contrast  06/09/2014   CLINICAL DATA:  Diffuse abdominal pain.  EXAM: CT ABDOMEN AND PELVIS WITH CONTRAST  TECHNIQUE: Multidetector CT imaging of the abdomen and pelvis was performed using the standard protocol following bolus administration of intravenous contrast.  CONTRAST:  117mL OMNIPAQUE IOHEXOL 300 MG/ML  SOLN  COMPARISON:  Ultrasound 05/19/2010  FINDINGS: Lower chest: Lung bases are clear. No effusions. Heart is normal size. Moderate-sized hiatal hernia.  Hepatobiliary: Prior cholecystectomy. Small calcification in the left hepatic lobe which appears benign. No focal hepatic abnormality. There is concern for rounded structure in the distal common bile duct measuring 8 mm. Common bile duct above this structure measures approximately 10 mm. This is best seen on coronal  images. Cannot exclude distal CBD stone.  Pancreas: No focal abnormality or ductal dilatation.  Spleen: No focal abnormality.  Normal size.  Adrenals/Urinary Tract: Adrenal glands are normal. Small bilateral benign appearing renal cysts. Small nonobstructing stone in the midpole of the left kidney and lower pole of the right kidney. No hydronephrosis or ureteral stones.  Stomach/Bowel: Stomach and small bowel decompressed, grossly unremarkable. Sigmoid diverticulosis. No active diverticulitis. Appendix is visualized and unremarkable.  Vascular/Lymphatic: Atherosclerotic calcifications throughout the aorta. No aneurysm. No adenopathy.  Reproductive: Posterior uterine fibroid measures 4.5 cm. No adnexal masses.  Other: No free fluid or free air.  Musculoskeletal: Degenerative disc disease and facet disease in the lower lumbar spine. No acute bony abnormality.  IMPRESSION: Concern for distal CBD stone, 8 mm. CBD mildly dilated.  This could be further evaluated with ultrasound, MRCP or ERCP if clinically indicated.  Moderate-sized hiatal hernia.  Small bilateral nephrolithiasis.  Colonic diverticulosis.  Uterine fibroid.  No acute findings.   Electronically Signed   By: Rolm Baptise M.D.   On: 06/09/2014 10:08   Mr Abdomen Mrcp Wo Cm  06/25/2014   CLINICAL DATA:  Right side abdominal pain off and on for 3 months. History of cholecystectomy.  EXAM: MRI ABDOMEN WITHOUT CONTRAST  (INCLUDING MRCP)  TECHNIQUE: Multiplanar multisequence MR imaging of the abdomen was performed. Heavily T2-weighted images of the biliary and pancreatic ducts were obtained, and three-dimensional MRCP images were rendered by post processing.  COMPARISON:  CT 06/09/2014  FINDINGS: Lower chest:  Lung bases are clear.  Hepatobiliary: There is mild intrahepatic biliary duct dilatation. The common hepatic duct is significantly dilated to 16 mm. The common bile duct is dilated 8 mm. Within the the proximal common bile duct there is a 7 mm filling  defect (image 10, series 5). This filling defect has migrated proximally from the ampullary region on comparison CT. Patient status post cholecystectomy.  Pancreas: Pancreatic duct is not dilated. There is a periampullary duodenum diverticulum measuring approximately 2.3 cm on image 6, series 5. Body and tail the pancreas are normal. No fluid collections.  Spleen: Normal spleen.  Adrenals/urinary tract: Adrenal glands and kidneys are normal.  Stomach/Bowel: Hiatal hernia.  Vascular/Lymphatic: Abdominal aortic normal caliber. No retroperitoneal periportal lymphadenopathy.  Musculoskeletal: No aggressive osseous lesion  IMPRESSION: 1. Choledocholithiasis with mobile stone at the junction of the common hepatic duct and common bile duct. 2. Mild intrahepatic duct dilatation and moderate common hepatic duct and common bile duct dilatation. 3. No pancreatic duct dilatation. Periampullary duodenum diverticulum noted.   Electronically Signed   By: Suzy Bouchard M.D.   On: 06/25/2014 10:29   Dg Ercp With Sphincterotomy  06/28/2014   CLINICAL DATA:  Choledocholithiasis  EXAM: ERCP with balloon sphincterotomy  TECHNIQUE: Multiple spot images obtained with the fluoroscopic device and submitted for interpretation post-procedure.  FLUOROSCOPY TIME:  If the device does not provide the exposure index:  Fluoroscopy Time:  7 minutes 38 seconds  Number of Acquired Images:  4  COMPARISON:  None.  FINDINGS: Spot fluoroscopic intra procedural imaging demonstrates cannulation of the bile ducts for retrograde injection. Diffuse common bile duct dilatation noted. Filling defect within the CBD compatible with choledocholithiasis. Access obtained for balloon sweep and sphincterotomy for stone removal.  IMPRESSION: Choledocholithiasis treated with balloon sweep and sphincterotomy.  These images were submitted for radiologic interpretation only. Please see the procedural report for the amount of contrast and the fluoroscopy time utilized.    Electronically Signed   By: Jerilynn Mages.  Shick M.D.   On: 06/28/2014 13:26   US Abdomen Limited Ruq  06/17/2014   CLINICAL DATA:  Recent CT scan of May 17th suggested a common bile duct stone  EXAM: US ABDOMEN LIMITED - RIGHT UPPER QUADRANT  COMPARISON:  Abdominal pelvic CT scan of Jun 09, 2014  FINDINGS: Gallbladder:  The gallbladder is surgically absent.  Common bile duct:  Diameter: The common bile duct is tortuous and dilated measuring as much is 15 mm in diameter. No definite common bile duct stone is demonstrated.  Liver:  No focal lesion identified. Within normal limits in parenchymal echogenicity.  The observed portions of the pancreas exhibit no acute abnormalities.  IMPRESSION: 1. There is marked dilation of the common bile duct. No definite common bile duct stone is demonstrated.  There is no definite pancreatic mass either. Further evaluation with ERCP or MRCP would be useful. 2. The liver exhibits no acute abnormality. The gallbladder is surgically absent.   Electronically Signed   By: David  Martinique M.D.   On: 06/17/2014 11:07    Microbiology: No results found for this or any previous visit (from the past 240 hour(s)).   Labs: Basic Metabolic Panel:  Recent Labs Lab 06/27/14 0011 06/28/14 0430 06/28/14 0830 06/29/14 0343  NA 142 140  --  141  K 3.2* 3.2*  --  3.0*  CL 102 106  --  108  CO2 29 23  --  23  GLUCOSE 112* 92  --  73  BUN 13 5*  --  <5*  CREATININE 0.85 0.78  --  0.67  CALCIUM 9.7 8.7*  --  8.2*  MG  --   --  1.9  --    Liver Function Tests:  Recent Labs Lab 06/27/14 0011 06/28/14 0830 06/29/14 0343  AST 524* 165* 74*  ALT 377* 217* 138*  ALKPHOS 172* 160* 130*  BILITOT 2.7* 1.5* 0.9  PROT 7.5 6.8 6.3*  ALBUMIN 4.3 3.7 3.2*    Recent Labs Lab 06/27/14 0011  LIPASE 45   No results for input(s): AMMONIA in the last 168 hours. CBC:  Recent Labs Lab 06/27/14 0011 06/28/14 0430  WBC 8.2 7.6  NEUTROABS 7.7*  --   HGB 13.4 12.9  HCT 39.8 39.7  MCV  90.8 91.3  PLT 162 173   Cardiac Enzymes:  Recent Labs Lab 06/27/14 0011  TROPONINI <0.03   BNP: BNP (last 3 results) No results for input(s): BNP in the last 8760 hours.  ProBNP (last 3 results) No results for input(s): PROBNP in the last 8760 hours.  CBG: No results for input(s): GLUCAP in the last 168 hours.     SignedDebbe Odea, MD [facility] 06/29/2014, 8:47 AM

## 2014-06-29 NOTE — Progress Notes (Signed)
Subjective: No abdominal pain; feels great. Tolerating diet.  Objective: Vital signs in last 24 hours: Temp:  [97.4 F (36.3 C)-98.2 F (36.8 C)] 98.2 F (36.8 C) (06/06 0547) Pulse Rate:  [72-114] 77 (06/06 0547) Resp:  [16-28] 16 (06/06 0547) BP: (132-149)/(69-88) 142/74 mmHg (06/06 0547) SpO2:  [98 %-100 %] 98 % (06/06 0547) Weight change:  Last BM Date: 06/27/14  PE: GEN:  NAD, pleasant, ready to go home ABD:  Soft, non-tender  Lab Results: CBC    Component Value Date/Time   WBC 7.6 06/28/2014 0430   WBC 6.5 10/13/2011 1117   RBC 4.35 06/28/2014 0430   RBC 4.14 10/13/2011 1117   HGB 12.9 06/28/2014 0430   HGB 12.6 10/13/2011 1117   HCT 39.7 06/28/2014 0430   HCT 37.4 10/13/2011 1117   PLT 173 06/28/2014 0430   PLT 191 10/13/2011 1117   MCV 91.3 06/28/2014 0430   MCV 90 10/13/2011 1117   MCH 29.7 06/28/2014 0430   MCH 30.4 10/13/2011 1117   MCHC 32.5 06/28/2014 0430   MCHC 33.7 10/13/2011 1117   RDW 13.7 06/28/2014 0430   RDW 14.8* 10/13/2011 1117   LYMPHSABS 0.1* 06/27/2014 0011   LYMPHSABS 0.7* 10/13/2011 1117   MONOABS 0.3 06/27/2014 0011   MONOABS 0.6 10/13/2011 1117   EOSABS 0.0 06/27/2014 0011   EOSABS 0.1 10/13/2011 1117   BASOSABS 0.0 06/27/2014 0011   BASOSABS 0.0 10/13/2011 1117   CMP     Component Value Date/Time   NA 141 06/29/2014 0343   NA 140 07/10/2011 0835   K 3.0* 06/29/2014 0343   K 3.8 07/10/2011 0835   CL 108 06/29/2014 0343   CL 102 07/10/2011 0835   CO2 23 06/29/2014 0343   CO2 31 07/10/2011 0835   GLUCOSE 73 06/29/2014 0343   GLUCOSE 98 07/10/2011 0835   BUN <5* 06/29/2014 0343   BUN 20* 07/10/2011 0835   CREATININE 0.67 06/29/2014 0343   CREATININE 0.93 12/02/2012 1129   CALCIUM 8.2* 06/29/2014 0343   CALCIUM 9.1 07/10/2011 0835   PROT 6.3* 06/29/2014 0343   PROT 7.9 07/10/2011 0835   ALBUMIN 3.2* 06/29/2014 0343   ALBUMIN 3.5 07/10/2011 0835   AST 74* 06/29/2014 0343   AST 25 07/10/2011 0835   ALT 138*  06/29/2014 0343   ALT 24 07/10/2011 0835   ALKPHOS 130* 06/29/2014 0343   ALKPHOS 109 07/10/2011 0835   BILITOT 0.9 06/29/2014 0343   GFRNONAA >60 06/29/2014 0343   GFRNONAA 57* 12/02/2012 1129   GFRAA >60 06/29/2014 0343   GFRAA >60 12/02/2012 1129   Assessment:  1.  Bile duct stone.  ERCP yesterday with biliary sphincterotomy and stone extraction. 2.  Elevated LFTs, continued down trend, due to #1 above. 3.  Abdominal pain, due to #1 above, resolved.  Plan:  1.  Low fat diet. 2.  No ASA/NSAIDs x 5 days post-sphincterotomy. 3.  OK to be discharged today from GI perspective; patient can follow-up with me in 2-3 weeks [facility] [phone]) as outpatient. 4.  Will sign-off; please call with questions; thank you for the consult.   Landry Dyke 06/29/2014, 12:03 PM   Pager [phone] If no answer or after 5 PM call [phone]

## 2014-06-29 NOTE — Discharge Planning (Signed)
Patient discharged home in stable condition. Verbalizes understanding of all discharge instructions, including home medications and follow up appointments. 

## 2014-07-21 NOTE — Progress Notes (Signed)
[place]  [phone] [phone]) [phone]  ID: Amy Hess OB: 1928/03/30  MR#: [id number]  IWP#:[id number]  Patient Care Team: Kirk Ruths, MD as PCP - General (Internal Medicine)  CHIEF COMPLAINT:  Chief Complaint  Patient presents with  . Follow-up    Tongue and thyroid cancer    INTERVAL HISTORY: Patient returns to clinic today for laboratory work and further evaluation.  She continues to well and is asymptomatic. She has a good appetite and her weight is stable. She denies any chest pain or shortness of breath.  She denies any weakness or fatigue. She denies any fevers.  She denies any dysphasia.  She has no neurologic complaints.  She denies any nausea, vomiting, constipation, or diarrhea.  She has no urinary complaints.  Patient offers no specific complaints today.  REVIEW OF SYSTEMS:   Review of Systems  Constitutional: Negative.   HENT: Negative.   Respiratory: Negative.   Cardiovascular: Negative.     As per HPI. Otherwise, a complete review of systems is negatve.  PAST MEDICAL HISTORY: Past Medical History  Diagnosis Date  . Cancer     thyroid and mouth cancer 2013  . Hypertension   . Arthritis   . [name] (coronary artery disease)     PAST SURGICAL HISTORY: Past Surgical History  Procedure Laterality Date  . Cholecystectomy    . Total thyroidectomy    . Ercp N/A 06/28/2014    Procedure: ENDOSCOPIC RETROGRADE CHOLANGIOPANCREATOGRAPHY (ERCP);  Surgeon: Arta Silence, MD;  Location: [facility] ENDOSCOPY;  Service: Endoscopy;  Laterality: N/A;    FAMILY HISTORY No family history on file.     ADVANCED DIRECTIVES:    HEALTH MAINTENANCE: History  Substance Use Topics  . Smoking status: Never Smoker   . Smokeless tobacco: Never Used  . Alcohol Use: No     Colonoscopy:  PAP:  Bone density:  Lipid panel:  Allergies  Allergen Reactions  . Dyazide [Hydrochlorothiazide W-Triamterene] Other (See Comments)    On  Chart ot MD office  . Plaquenil [Hydroxychloroquine Sulfate] Other (See Comments)    Not sure per MD office on chart   . Sulfate Other (See Comments)    Pt not sure  . Codeine Rash    Current Outpatient Prescriptions  Medication Sig Dispense Refill  . Cetirizine HCl 10 MG CAPS Take 10 mg by mouth daily.     . folic acid (FOLVITE) 1 MG tablet Take 1 mg by mouth daily.     Marland Kitchen levothyroxine (SYNTHROID, LEVOTHROID) 88 MCG tablet Take 88 mcg by mouth daily.  2  . Multiple Vitamin (MULTIVITAMIN) capsule Take 1 capsule by mouth daily.     Marland Kitchen omeprazole (PRILOSEC) 40 MG capsule Take 40 mg by mouth daily.     . predniSONE (DELTASONE) 5 MG tablet Take 2.5 mg by mouth daily with breakfast.   1  . torsemide (DEMADEX) 5 MG tablet Take 5 mg by mouth daily.  3  . amLODipine (NORVASC) 5 MG tablet Take 5 mg by mouth daily.  11  . amoxicillin-clavulanate (AUGMENTIN) 875-125 MG per tablet Take 1 tablet by mouth 2 (two) times daily. 10 tablet 0  . KLOR-CON M10 10 MEQ tablet Take 10 mEq by mouth daily.  12   No current facility-administered medications for this visit.    OBJECTIVE: Filed Vitals:   06/23/14 1258  BP: 158/85  Pulse: 72  Temp: 97.8 F (36.6 C)  Resp: 16     There is no height  on file to calculate BMI.    ECOG FS:0 - Asymptomatic  General: Well-developed, well-nourished, no acute distress. Eyes:anicteric sclera. HEENT: Normocephalic, moist mucous membranes, clear oropharnyx. No palpable lymphadenopathy. Lungs: Clear to auscultation bilaterally. Heart: Regular rate and rhythm. No rubs, murmurs, or gallops. Abdomen: Soft, nontender, nondistended. No organomegaly noted, normoactive bowel sounds. Musculoskeletal: No edema, cyanosis, or clubbing. Neuro: Alert, answering all questions appropriately. Cranial nerves grossly intact. Skin: No rashes or petechiae noted. Psych: Normal affect.  LAB RESULTS:  Lab Results  Component Value Date   NA 141 06/29/2014   K 3.0* 06/29/2014   CL  108 06/29/2014   CO2 23 06/29/2014   GLUCOSE 73 06/29/2014   BUN <5* 06/29/2014   CREATININE 0.67 06/29/2014   CALCIUM 8.2* 06/29/2014   PROT 6.3* 06/29/2014   ALBUMIN 3.2* 06/29/2014   AST 74* 06/29/2014   ALT 138* 06/29/2014   ALKPHOS 130* 06/29/2014   BILITOT 0.9 06/29/2014   GFRNONAA >60 06/29/2014   GFRAA >60 06/29/2014    Lab Results  Component Value Date   WBC 7.6 06/28/2014   NEUTROABS 7.7* 06/27/2014   HGB 12.9 06/28/2014   HCT 39.7 06/28/2014   MCV 91.3 06/28/2014   PLT 173 06/28/2014     STUDIES: Mr Abdomen Mrcp Wo Cm  06/25/2014   CLINICAL DATA:  Right side abdominal pain off and on for 3 months. History of cholecystectomy.  EXAM: MRI ABDOMEN WITHOUT CONTRAST  (INCLUDING MRCP)  TECHNIQUE: Multiplanar multisequence MR imaging of the abdomen was performed. Heavily T2-weighted images of the biliary and pancreatic ducts were obtained, and three-dimensional MRCP images were rendered by post processing.  COMPARISON:  CT 06/09/2014  FINDINGS: Lower chest:  Lung bases are clear.  Hepatobiliary: There is mild intrahepatic biliary duct dilatation. The common hepatic duct is significantly dilated to 16 mm. The common bile duct is dilated 8 mm. Within the the proximal common bile duct there is a 7 mm filling defect (image 10, series 5). This filling defect has migrated proximally from the ampullary region on comparison CT. Patient status post cholecystectomy.  Pancreas: Pancreatic duct is not dilated. There is a periampullary duodenum diverticulum measuring approximately 2.3 cm on image 6, series 5. Body and tail the pancreas are normal. No fluid collections.  Spleen: Normal spleen.  Adrenals/urinary tract: Adrenal glands and kidneys are normal.  Stomach/Bowel: Hiatal hernia.  Vascular/Lymphatic: Abdominal aortic normal caliber. No retroperitoneal periportal lymphadenopathy.  Musculoskeletal: No aggressive osseous lesion  IMPRESSION: 1. Choledocholithiasis with mobile stone at the  junction of the common hepatic duct and common bile duct. 2. Mild intrahepatic duct dilatation and moderate common hepatic duct and common bile duct dilatation. 3. No pancreatic duct dilatation. Periampullary duodenum diverticulum noted.   Electronically Signed   By: Suzy Bouchard M.D.   On: 06/25/2014 10:29   Dg Ercp With Sphincterotomy  06/28/2014   CLINICAL DATA:  Choledocholithiasis  EXAM: ERCP with balloon sphincterotomy  TECHNIQUE: Multiple spot images obtained with the fluoroscopic device and submitted for interpretation post-procedure.  FLUOROSCOPY TIME:  If the device does not provide the exposure index:  Fluoroscopy Time:  7 minutes 38 seconds  Number of Acquired Images:  4  COMPARISON:  None.  FINDINGS: Spot fluoroscopic intra procedural imaging demonstrates cannulation of the bile ducts for retrograde injection. Diffuse common bile duct dilatation noted. Filling defect within the CBD compatible with choledocholithiasis. Access obtained for balloon sweep and sphincterotomy for stone removal.  IMPRESSION: Choledocholithiasis treated with balloon sweep and sphincterotomy.  These  images were submitted for radiologic interpretation only. Please see the procedural report for the amount of contrast and the fluoroscopy time utilized.   Electronically Signed   By: Jerilynn Mages.  Shick M.D.   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  PLAN:    1.  Head and neck cancer: No evidence of disease.  Patient reports recent endoscopy was also within normal limits. No further imaging is necessary unless there is suspicion of recurrence.  Return to clinic in 1 year for routine evaluation.  She has also been instructed to keep her endoscopy appointments with ENT.    2.  Thyroid cancer: Patient had her total thyroidectomy on Jun 22, 2011.  Continue Synthroid 75 mcg daily.  Antithyroglobulin antibodies are negative.   3.  Probable meningioma:  Repeat MRI is unchanged, no further imaging is necessary unless patient becomes symptomatic.  Patient expressed understanding and was in agreement with this plan. She also understands that She can call clinic at any time with any questions, concerns, or complaints.   No matching staging information was found for the patient.  Lloyd Huger, MD   07/21/2014 5:29 PM

## 2014-07-23 ENCOUNTER — Other Ambulatory Visit: Payer: Self-pay | Admitting: Gastroenterology

## 2014-07-23 ENCOUNTER — Ambulatory Visit
Admission: RE | Admit: 2014-07-23 | Discharge: 2014-07-23 | Disposition: A | Discharge status: Home / Self Care | Payer: Medicare Other | Source: Ambulatory Visit | Attending: Gastroenterology | Admitting: Gastroenterology

## 2014-07-23 DIAGNOSIS — Z9889 Other specified postprocedural states: Secondary | ICD-10-CM

## 2014-08-05 ENCOUNTER — Other Ambulatory Visit: Payer: Self-pay | Admitting: Gastroenterology

## 2014-08-05 NOTE — Addendum Note (Signed)
Addended by: Selin Eisler on: 08/05/2014 11:00 AM   Modules accepted: Orders  

## 2014-08-13 ENCOUNTER — Other Ambulatory Visit: Payer: Self-pay | Admitting: Gastroenterology

## 2014-08-13 NOTE — Addendum Note (Signed)
Addended by: Arta Silence on: 08/13/2014 09:04 AM   Modules accepted: Orders

## 2014-08-14 ENCOUNTER — Encounter (HOSPITAL_COMMUNITY): Payer: Self-pay | Admitting: *Deleted

## 2014-08-19 ENCOUNTER — Encounter (HOSPITAL_COMMUNITY): Payer: Self-pay | Admitting: Anesthesiology

## 2014-08-19 ENCOUNTER — Ambulatory Visit (HOSPITAL_COMMUNITY): Payer: Medicare Other | Admitting: Anesthesiology

## 2014-08-19 ENCOUNTER — Ambulatory Visit (HOSPITAL_COMMUNITY)
Admission: RE | Admit: 2014-08-19 | Discharge: 2014-08-19 | Disposition: A | Discharge status: Home / Self Care | Payer: Medicare Other | Source: Ambulatory Visit | Attending: Gastroenterology | Admitting: Gastroenterology

## 2014-08-19 ENCOUNTER — Encounter (HOSPITAL_COMMUNITY)
Admission: RE | Disposition: A | Discharge status: Home / Self Care | Payer: Self-pay | Source: Ambulatory Visit | Attending: Gastroenterology

## 2014-08-19 DIAGNOSIS — K449 Diaphragmatic hernia without obstruction or gangrene: Secondary | ICD-10-CM | POA: Diagnosis not present

## 2014-08-19 DIAGNOSIS — Z79899 Other long term (current) drug therapy: Secondary | ICD-10-CM | POA: Insufficient documentation

## 2014-08-19 DIAGNOSIS — Z4689 Encounter for fitting and adjustment of other specified devices: Secondary | ICD-10-CM | POA: Insufficient documentation

## 2014-08-19 DIAGNOSIS — E039 Hypothyroidism, unspecified: Secondary | ICD-10-CM | POA: Insufficient documentation

## 2014-08-19 DIAGNOSIS — M1990 Unspecified osteoarthritis, unspecified site: Secondary | ICD-10-CM | POA: Diagnosis not present

## 2014-08-19 DIAGNOSIS — I10 Essential (primary) hypertension: Secondary | ICD-10-CM | POA: Diagnosis not present

## 2014-08-19 DIAGNOSIS — K5710 Diverticulosis of small intestine without perforation or abscess without bleeding: Secondary | ICD-10-CM | POA: Diagnosis not present

## 2014-08-19 DIAGNOSIS — Z7952 Long term (current) use of systemic steroids: Secondary | ICD-10-CM | POA: Diagnosis not present

## 2014-08-19 DIAGNOSIS — I2510 Atherosclerotic heart disease of native coronary artery without angina pectoris: Secondary | ICD-10-CM | POA: Insufficient documentation

## 2014-08-19 HISTORY — PX: ESOPHAGOGASTRODUODENOSCOPY (EGD) WITH PROPOFOL: SHX5813

## 2014-08-19 HISTORY — PX: GASTROINTESTINAL STENT REMOVAL: SHX6384

## 2014-08-19 SURGERY — ESOPHAGOGASTRODUODENOSCOPY (EGD) WITH PROPOFOL
Anesthesia: Monitor Anesthesia Care

## 2014-08-19 MED ORDER — PROPOFOL INFUSION 10 MG/ML OPTIME
INTRAVENOUS | Status: DC | PRN
  Administered 2014-08-19: 100 ug/kg/min via INTRAVENOUS

## 2014-08-19 MED ORDER — SODIUM CHLORIDE 0.9 % IV SOLN: INTRAVENOUS | Status: DC

## 2014-08-19 MED ORDER — LACTATED RINGERS IV SOLN
INTRAVENOUS | Status: DC | PRN
  Administered 2014-08-19: 09:00:00 via INTRAVENOUS

## 2014-08-19 MED ORDER — PROPOFOL 10 MG/ML IV BOLUS
INTRAVENOUS | Status: DC | PRN
  Administered 2014-08-19: 50 mg via INTRAVENOUS
  Administered 2014-08-19: 10 mg via INTRAVENOUS

## 2014-08-19 MED ORDER — PROPOFOL 10 MG/ML IV BOLUS
INTRAVENOUS | Status: AC
  Filled 2014-08-19: qty 20

## 2014-08-19 SURGICAL SUPPLY — 14 items

## 2014-08-19 NOTE — Op Note (Signed)
[facility] [place], [address]   ENDOSCOPY PROCEDURE REPORT  PATIENT: Amy Hess, Amy Hess  MR#: [id number] BIRTHDATE: 1928/10/29 , 27  yrs. old GENDER: female ENDOSCOPIST: Arta Silence, MD REFERRED BY:  Frazier Richards, M.D. PROCEDURE DATE:  Sep 04, 2014 PROCEDURE:  EGD w/ fb removal ASA CLASS:     Class III INDICATIONS:  duodenal foreign body (pancreatic duct stent from prior ERCP).. MEDICATIONS: Monitored anesthesia care TOPICAL ANESTHETIC: none  DESCRIPTION OF PROCEDURE: After the risks benefits and alternatives of the procedure were thoroughly explained, informed consent was obtained.  The Pentax Gastroscope [id number] endoscope was introduced through the mouth and advanced to the second portion of the duodenum. The instrument was slowly withdrawn as the mucosa was fully examined. Estimated blood loss is zero unless otherwise noted in this procedure report.    Findings:  Large hiatal hernia.  Small periampullary duodenal diverticulum.  Pancreatic duct stent seen protruding from ampulla, and removed with snare.  Otherwise normal endoscopy to the second portion of the duodenum.              The scope was then withdrawn from the patient and the procedure completed.  COMPLICATIONS: There were no immediate complications.  ENDOSCOPIC IMPRESSION:     As above.  Successful removal of pancreatic duct stent.  RECOMMENDATIONS:     1.  Watch for potential complications of procedure. 2.  Follow-up with [facility] on as-needed basis. 3.  No further GI testing anticipated at the present time, in absence of new/interval symptoms.  eSigned:  Arta Silence, MD 09-04-2014 9:06 AM   CC:  CPT CODES: ICD CODES:  The ICD and CPT codes recommended by this software are interpretations from the data that the clinical staff has captured with the software.  The verification of the translation of this report to the ICD and CPT codes and modifiers is the  sole responsibility of the health care institution and practicing physician where this report was generated.  [facility] will not be held responsible for the validity of the ICD and CPT codes included on this report.  AMA assumes no liability for data contained or not contained herein. CPT is a Designer, television/film set of the [employer].

## 2014-08-19 NOTE — Anesthesia Preprocedure Evaluation (Addendum)
Anesthesia Evaluation  Patient identified by MRN, date of birth, ID band Patient awake    Reviewed: Allergy & Precautions, NPO status , Patient's Chart, lab work & pertinent test results  Airway Mallampati: II  TM Distance: >3 FB Neck ROM: Full    Dental   Pulmonary neg pulmonary ROS,  breath sounds clear to auscultation        Cardiovascular hypertension, Pt. on medications + [name] Rhythm:Regular Rate:Normal     Neuro/Psych negative neurological ROS     GI/Hepatic negative GI ROS, Neg liver ROS,   Endo/Other  Hypothyroidism   Renal/GU negative Renal ROS     Musculoskeletal  (+) Arthritis -,   Abdominal   Peds  Hematology Hx tounge and thyroid ca   Anesthesia Other Findings   Reproductive/Obstetrics                            Anesthesia Physical Anesthesia Plan  ASA: III  Anesthesia Plan: MAC   Post-op Pain Management:    Induction: Intravenous  Airway Management Planned: Nasal Cannula and Simple Face Mask  Additional Equipment:   Intra-op Plan:   Post-operative Plan:   Informed Consent: I have reviewed the patients History and Physical, chart, labs and discussed the procedure including the risks, benefits and alternatives for the proposed anesthesia with the patient or authorized representative who has indicated his/her understanding and acceptance.   Dental advisory given  Plan Discussed with: CRNA  Anesthesia Plan Comments:         Anesthesia Quick Evaluation

## 2014-08-19 NOTE — Anesthesia Postprocedure Evaluation (Signed)
  Anesthesia Post-op Note  Patient: Chessie Neuharth Burgueno  Procedure(s) Performed: Procedure(s): ESOPHAGOGASTRODUODENOSCOPY (EGD) WITH PROPOFOL (N/A) GASTROINTESTINAL STENT REMOVAL (N/A)  Patient Location: PACU  Anesthesia Type:MAC  Level of Consciousness: awake, alert  and oriented  Airway and Oxygen Therapy: Patient Spontanous Breathing  Post-op Pain: none  Post-op Assessment: Post-op Vital signs reviewed              Post-op Vital Signs: Reviewed  Last Vitals:  Filed Vitals:   08/19/14 0932  BP: 178/95  Pulse:   Temp:   Resp: 11    Complications: No apparent anesthesia complications

## 2014-08-19 NOTE — H&P (Signed)
Patient interval history reviewed.  Patient examined again.  There has been no change from documented H/P dated 07/23/14 (scanned into chart from our office) except as documented above.  Assessment:  1.  Prior ERCP with pancreatic duct stent placement; persistent on prior xray several weeks post-procedure.  Plan:  1.  Endoscopy for foreign body (stent) removal. 2.  Risks (bleeding, infection, bowel perforation that could require surgery, sedation-related changes in cardiopulmonary systems), benefits (identification and possible treatment of source of symptoms, exclusion of certain causes of symptoms), and alternatives (watchful waiting, radiographic imaging studies, empiric medical treatment) of upper endoscopy (EGD) were explained to patient/family in detail and patient wishes to proceed.

## 2014-08-19 NOTE — Discharge Instructions (Signed)
Esophagogastroduodenoscopy °Care After °Refer to this sheet in the next few weeks. These instructions provide you with information on caring for yourself after your procedure. Your caregiver may also give you more specific instructions. Your treatment has been planned according to current medical practices, but problems sometimes occur. Call your caregiver if you have any problems or questions after your procedure.  °HOME CARE INSTRUCTIONS °· Do not eat or drink anything until the numbing medicine (local anesthetic) has worn off and your gag reflex has returned. You will know that the local anesthetic has worn off when you can swallow comfortably. °· Do not drive for 12 hours after the procedure or as directed by your caregiver. °· Only take medicines as directed by your caregiver. °SEEK MEDICAL CARE IF:  °· You cannot stop coughing. °· You are not urinating at all or less than usual. °SEEK IMMEDIATE MEDICAL CARE IF: °· You have difficulty swallowing. °· You cannot eat or drink. °· You have worsening throat or chest pain. °· You have dizziness, lightheadedness, or you faint. °· You have nausea or vomiting. °· You have chills. °· You have a fever. °· You have severe abdominal pain. °· You have black, tarry, or bloody stools. °Document Released: 12/27/2011 Document Reviewed: 12/27/2011 °ExitCare® Patient Information ©2015 [employer]. This information is not intended to replace advice given to you by your health care provider. Make sure you discuss any questions you have with your health care provider. ° °

## 2014-08-19 NOTE — Transfer of Care (Signed)
Immediate Anesthesia Transfer of Care Note  Patient: Amy Hess  Procedure(s) Performed: Procedure(s): ESOPHAGOGASTRODUODENOSCOPY (EGD) WITH PROPOFOL (N/A) GASTROINTESTINAL STENT REMOVAL (N/A)  Patient Location: PACU  Anesthesia Type:MAC  Level of Consciousness:  sedated, patient cooperative and responds to stimulation  Airway & Oxygen Therapy:Patient Spontanous Breathing and Patient connected to face mask oxgen  Post-op Assessment:  Report given to PACU RN and Post -op Vital signs reviewed and stable  Post vital signs:  Reviewed and stable  Last Vitals:  Filed Vitals:   08/19/14 0732  BP: 167/88  Pulse: 72  Temp: 36.4 C  Resp: 12    Complications: No apparent anesthesia complications

## 2014-08-20 ENCOUNTER — Encounter (HOSPITAL_COMMUNITY): Payer: Self-pay | Admitting: Gastroenterology

## 2014-10-08 ENCOUNTER — Ambulatory Visit
Admission: RE | Admit: 2014-10-08 | Discharge: 2014-10-08 | Disposition: A | Discharge status: Home / Self Care | Payer: Medicare Other | Source: Ambulatory Visit | Attending: Radiation Oncology | Admitting: Radiation Oncology

## 2014-10-08 ENCOUNTER — Encounter: Payer: Self-pay | Admitting: Radiation Oncology

## 2014-10-08 VITALS — BP 171/91 | HR 70 | Temp 96.4°F | Resp 20 | Wt 111.9 lb

## 2014-10-08 DIAGNOSIS — C01 Malignant neoplasm of base of tongue: Secondary | ICD-10-CM

## 2014-10-08 NOTE — Progress Notes (Signed)
Radiation Oncology Follow up Note  Name: Amy Hess   Date:   10/08/2014 MRN:  [id number] DOB: [birth year]    This 81 y.o. female presents to the clinic today for follow-up for oral tongue carcinoma (T2 N0 M0).  REFERRING PROVIDER: No ref. provider found  HPI: Patient is a 81 year old female now out close to 4 years having completed radiation therapy to her head and neck region for a T2 squamous cell carcinoma the left oral tongue. She is seen today in routine follow-up and is doing well. She has no dysphagia and no head and neck pain. She keep is closely followed by ENT which reports no evidence of disease..  COMPLICATIONS OF TREATMENT: none  FOLLOW UP COMPLIANCE: keeps appointments   PHYSICAL EXAM:  BP 171/91 mmHg  Pulse 70  Temp(Src) 96.4 F (35.8 C)  Resp 20  Wt 111 lb 14.1 oz (50.75 kg) Oral cavity is clear no oral mucosal lesions are identified. Tongue is mobile indirect mirror examination shows vallecula base of tongue within normal limits. Neck is clear without evidence of subject gastric cervical or supraclavicular adenopathy. Well-developed well-nourished patient in NAD. HEENT reveals PERLA, EOMI, discs not visualized.  Oral cavity is clear. No oral mucosal lesions are identified. Neck is clear without evidence of cervical or supraclavicular adenopathy. Lungs are clear to A&P. Cardiac examination is essentially unremarkable with regular rate and rhythm without murmur rub or thrill. Abdomen is benign with no organomegaly or masses noted. Motor sensory and DTR levels are equal and symmetric in the upper and lower extremities. Cranial nerves II through XII are grossly intact. Proprioception is intact. No peripheral adenopathy or edema is identified. No motor or sensory levels are noted. Crude visual fields are within normal range.   RADIOLOGY RESULTS: I have requested a chest x-ray  PLAN: At the present time she is doing well with no evidence of disease. Patient believes  his been a while since she's had a chest x-ray and I would like to have one ordered by her PMD just because she is a high risk for aerodigestive carcinoma based on her head and neck cancer diagnosis. Otherwise she has no evidence of disease of asked to see her back in 1 year for follow-up and then will discontinue follow-up care. Patient is to call anytime with concerns.  I would like to take this opportunity for allowing me to participate in the care of your patient.Armstead Peaks., MD

## 2015-06-23 ENCOUNTER — Inpatient Hospital Stay: Payer: Medicare Other | Admitting: Oncology

## 2015-06-23 ENCOUNTER — Ambulatory Visit: Payer: Medicare Other | Admitting: Oncology

## 2015-06-23 ENCOUNTER — Other Ambulatory Visit: Payer: Self-pay | Admitting: *Deleted

## 2015-06-23 ENCOUNTER — Other Ambulatory Visit: Payer: Medicare Other

## 2015-06-23 ENCOUNTER — Inpatient Hospital Stay: Payer: Medicare Other | Attending: Oncology

## 2015-06-23 DIAGNOSIS — Z9221 Personal history of antineoplastic chemotherapy: Secondary | ICD-10-CM | POA: Insufficient documentation

## 2015-06-23 DIAGNOSIS — Z85810 Personal history of malignant neoplasm of tongue: Secondary | ICD-10-CM

## 2015-06-23 DIAGNOSIS — Z923 Personal history of irradiation: Secondary | ICD-10-CM | POA: Insufficient documentation

## 2015-06-23 LAB — CBC WITH DIFFERENTIAL/PLATELET
Basophils Absolute: 0 10*3/uL (ref 0–0.1)
Basophils Relative: 0 %
Eosinophils Absolute: 0 10*3/uL (ref 0–0.7)
Eosinophils Relative: 0 %
HEMATOCRIT: 39.4 % (ref 35.0–47.0)
HEMOGLOBIN: 13.5 g/dL (ref 12.0–16.0)
Lymphocytes Relative: 21 %
Lymphs Abs: 0.8 10*3/uL — ABNORMAL LOW (ref 1.0–3.6)
MCH: 30.5 pg (ref 26.0–34.0)
MCHC: 34.2 g/dL (ref 32.0–36.0)
MCV: 89.3 fL (ref 80.0–100.0)
MONOS PCT: 8 %
Monocytes Absolute: 0.3 10*3/uL (ref 0.2–0.9)
NEUTROS ABS: 2.6 10*3/uL (ref 1.4–6.5)
NEUTROS PCT: 71 %
Platelets: 171 10*3/uL (ref 150–440)
RBC: 4.41 MIL/uL (ref 3.80–5.20)
RDW: 13.1 % (ref 11.5–14.5)
WBC: 3.7 10*3/uL (ref 3.6–11.0)

## 2015-06-23 LAB — T4, FREE: Free T4: 1.01 ng/dL (ref 0.61–1.12)

## 2015-06-23 LAB — TSH: TSH: 0.26 u[IU]/mL — ABNORMAL LOW (ref 0.350–4.500)

## 2015-06-24 LAB — THYROGLOBULIN ANTIBODY: Thyroglobulin Antibody: <1 IU/mL (ref 0.0–0.9)

## 2015-06-30 ENCOUNTER — Inpatient Hospital Stay: Payer: Medicare Other | Attending: Oncology | Admitting: Oncology

## 2015-06-30 VITALS — BP 136/83 | HR 62 | Temp 97.6°F | Resp 14 | Wt 112.9 lb

## 2015-06-30 DIAGNOSIS — Z85850 Personal history of malignant neoplasm of thyroid: Secondary | ICD-10-CM | POA: Insufficient documentation

## 2015-06-30 DIAGNOSIS — M129 Arthropathy, unspecified: Secondary | ICD-10-CM | POA: Insufficient documentation

## 2015-06-30 DIAGNOSIS — I10 Essential (primary) hypertension: Secondary | ICD-10-CM | POA: Diagnosis not present

## 2015-06-30 DIAGNOSIS — Z79899 Other long term (current) drug therapy: Secondary | ICD-10-CM | POA: Insufficient documentation

## 2015-06-30 DIAGNOSIS — I2510 Atherosclerotic heart disease of native coronary artery without angina pectoris: Secondary | ICD-10-CM | POA: Insufficient documentation

## 2015-06-30 DIAGNOSIS — C760 Malignant neoplasm of head, face and neck: Secondary | ICD-10-CM

## 2015-06-30 DIAGNOSIS — Z85819 Personal history of malignant neoplasm of unspecified site of lip, oral cavity, and pharynx: Secondary | ICD-10-CM | POA: Insufficient documentation

## 2015-06-30 DIAGNOSIS — Z7982 Long term (current) use of aspirin: Secondary | ICD-10-CM

## 2015-06-30 NOTE — Progress Notes (Signed)
Patient does not offer any problems today.  

## 2015-07-04 NOTE — Progress Notes (Signed)
Amy Hess  [phone] [phone]  ID: KEALIE ROSENE OB: 06/11/1928  MR#: [id number]  [id number]  Patient Care Team: Kirk Ruths, MD as PCP - General (Internal Medicine)  CHIEF COMPLAINT:  Chief Complaint  Patient presents with  . Head and neck cancer    INTERVAL HISTORY: Patient returns to clinic today for laboratory work and routine yearly evaluation.  She continues to feel well and is asymptomatic. She has a good appetite and her weight is stable. She denies any chest pain or shortness of breath.  She denies any weakness or fatigue. She denies any fevers.  She denies any dysphasia.  She has no neurologic complaints.  She denies any nausea, vomiting, constipation, or diarrhea.  She has no urinary complaints.  Patient offers no specific complaints today.  REVIEW OF SYSTEMS:   Review of Systems  Constitutional: Negative.  Negative for fever, weight loss and malaise/fatigue.  HENT: Negative.  Negative for sore throat.   Respiratory: Negative for cough and shortness of breath.   Cardiovascular: Negative.  Negative for chest pain.  Gastrointestinal: Negative.   Genitourinary: Negative.   Musculoskeletal: Negative.   Neurological: Negative.  Negative for focal weakness, weakness and headaches.  Psychiatric/Behavioral: Negative.     As per HPI. Otherwise, a complete review of systems is negatve.  PAST MEDICAL HISTORY: Past Medical History  Diagnosis Date  . Cancer     thyroid and mouth cancer 2013  . Hypertension   . Arthritis   . [name] (coronary artery disease)     PAST SURGICAL HISTORY: Past Surgical History  Procedure Laterality Date  . Cholecystectomy    . Total thyroidectomy    . Ercp N/A 06/28/2014    Procedure: ENDOSCOPIC RETROGRADE CHOLANGIOPANCREATOGRAPHY (ERCP);  Surgeon: Arta Silence, MD;  Location: [facility] ENDOSCOPY;  Service: Endoscopy;  Laterality: N/A;  . Esophagogastroduodenoscopy (egd) with propofol N/A  08/19/2014    Procedure: ESOPHAGOGASTRODUODENOSCOPY (EGD) WITH PROPOFOL;  Surgeon: Arta Silence, MD;  Location: [facility] ENDOSCOPY;  Service: Endoscopy;  Laterality: N/A;  . Gastrointestinal stent removal N/A 08/19/2014    Procedure: GASTROINTESTINAL STENT REMOVAL;  Surgeon: Arta Silence, MD;  Location: [facility] ENDOSCOPY;  Service: Endoscopy;  Laterality: N/A;    FAMILY HISTORY: Reviewed and unchanged. No reported history of malignancy or chronic disease.     ADVANCED DIRECTIVES:    HEALTH MAINTENANCE: Social History  Substance Use Topics  . Smoking status: Never Smoker   . Smokeless tobacco: Never Used  . Alcohol Use: No     Colonoscopy:  PAP:  Bone density:  Lipid panel:  Allergies  Allergen Reactions  . Dyazide [Hydrochlorothiazide W-Triamterene] Other (See Comments)    On Chart ot MD office  . Plaquenil [Hydroxychloroquine Sulfate] Other (See Comments)    Not sure per MD office on chart   . Sulfate Other (See Comments)    Pt not sure  . Codeine Rash    Current Outpatient Prescriptions  Medication Sig Dispense Refill  . amLODipine (NORVASC) 5 MG tablet Take 5 mg by mouth every morning.   11  . aspirin EC 81 MG tablet Take 81 mg by mouth daily.    . Calcium Carb-Cholecalciferol (CALCIUM 600 + D PO) Take 1 tablet by mouth daily.    . folic acid (FOLVITE) 1 MG tablet Take 1 mg by mouth daily.     Marland Kitchen KLOR-CON M10 10 MEQ tablet Take 10 mEq by mouth daily.  12  . levothyroxine (SYNTHROID, LEVOTHROID) 88 MCG tablet Take 88  mcg by mouth daily.  2  . Multiple Vitamin (MULTIVITAMIN) capsule Take 1 capsule by mouth daily.     . predniSONE (DELTASONE) 5 MG tablet Take 2.5 mg by mouth daily with breakfast.   1  . ranitidine (ZANTAC) 150 MG tablet Take 150 mg by mouth at bedtime.    . torsemide (DEMADEX) 5 MG tablet Take 5 mg by mouth every morning.   3   No current facility-administered medications for this visit.    OBJECTIVE: Filed Vitals:   06/30/15 0855  BP: 136/83  Pulse: 62   Temp: 97.6 F (36.4 C)  Resp: 14     Body mass index is 19.37 kg/(m^2).    ECOG FS:0 - Asymptomatic  General: Well-developed, well-nourished, no acute distress. Eyes:anicteric sclera. HEENT: Normocephalic, moist mucous membranes, clear oropharnyx. No palpable lymphadenopathy. Lungs: Clear to auscultation bilaterally. Heart: Regular rate and rhythm. No rubs, murmurs, or gallops. Abdomen: Soft, nontender, nondistended. No organomegaly noted, normoactive bowel sounds. Musculoskeletal: No edema, cyanosis, or clubbing. Neuro: Alert, answering all questions appropriately. Cranial nerves grossly intact. Skin: No rashes or petechiae noted. Psych: Normal affect.  LAB RESULTS:  Lab Results  Component Value Date   NA 141 06/29/2014   K 3.0* 06/29/2014   CL 108 06/29/2014   CO2 23 06/29/2014   GLUCOSE 73 06/29/2014   BUN <5* 06/29/2014   CREATININE 0.67 06/29/2014   CALCIUM 8.2* 06/29/2014   PROT 6.3* 06/29/2014   ALBUMIN 3.2* 06/29/2014   AST 74* 06/29/2014   ALT 138* 06/29/2014   ALKPHOS 130* 06/29/2014   BILITOT 0.9 06/29/2014   GFRNONAA >60 06/29/2014   GFRAA >60 06/29/2014    Lab Results  Component Value Date   WBC 3.7 06/23/2015   NEUTROABS 2.6 06/23/2015   HGB 13.5 06/23/2015   HCT 39.4 06/23/2015   MCV 89.3 06/23/2015   PLT 171 06/23/2015     STUDIES: No results found.  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  PLAN:    1.  Head and neck cancer: No evidence of disease.  Patient reports she has an endoscopy scheduled with Dr. Nadeen Landau at [facility] in the next several weeks. No further imaging is necessary unless there is suspicion of recurrence.  Return to clinic in 1 year for routine evaluation, at this point patient will be 5 years removed from her treatments and likely can be discharged from clinic.     2.  Thyroid cancer: Patient had her total thyroidectomy on Jun 22, 2011.  Continue Synthroid 75 mcg  daily.  Antithyroglobulin antibodies are negative.   3.  Probable meningioma: Repeat MRI is unchanged, no further imaging is necessary unless patient becomes symptomatic.  Patient expressed understanding and was in agreement with this plan. She also understands that She can call clinic at any time with any questions, concerns, or complaints.    Lloyd Huger, MD   07/04/2015 5:14 PM

## 2015-09-17 ENCOUNTER — Emergency Department (HOSPITAL_COMMUNITY): Payer: Medicare Other

## 2015-09-17 ENCOUNTER — Encounter (HOSPITAL_COMMUNITY): Payer: Self-pay

## 2015-09-17 ENCOUNTER — Emergency Department (HOSPITAL_COMMUNITY)
Admission: EM | Admit: 2015-09-17 | Discharge: 2015-09-18 | Disposition: A | Discharge status: Home / Self Care | Payer: Medicare Other | Attending: Emergency Medicine | Admitting: Emergency Medicine

## 2015-09-17 DIAGNOSIS — S6991XA Unspecified injury of right wrist, hand and finger(s), initial encounter: Secondary | ICD-10-CM | POA: Diagnosis present

## 2015-09-17 DIAGNOSIS — Y999 Unspecified external cause status: Secondary | ICD-10-CM | POA: Insufficient documentation

## 2015-09-17 DIAGNOSIS — Z79899 Other long term (current) drug therapy: Secondary | ICD-10-CM | POA: Insufficient documentation

## 2015-09-17 DIAGNOSIS — T148XXA Other injury of unspecified body region, initial encounter: Secondary | ICD-10-CM

## 2015-09-17 DIAGNOSIS — Y939 Activity, unspecified: Secondary | ICD-10-CM | POA: Insufficient documentation

## 2015-09-17 DIAGNOSIS — W100XXA Fall (on)(from) escalator, initial encounter: Secondary | ICD-10-CM | POA: Diagnosis not present

## 2015-09-17 DIAGNOSIS — S60221A Contusion of right hand, initial encounter: Secondary | ICD-10-CM | POA: Insufficient documentation

## 2015-09-17 DIAGNOSIS — I10 Essential (primary) hypertension: Secondary | ICD-10-CM | POA: Insufficient documentation

## 2015-09-17 DIAGNOSIS — S300XXA Contusion of lower back and pelvis, initial encounter: Secondary | ICD-10-CM | POA: Insufficient documentation

## 2015-09-17 DIAGNOSIS — Y9289 Other specified places as the place of occurrence of the external cause: Secondary | ICD-10-CM | POA: Diagnosis not present

## 2015-09-17 DIAGNOSIS — Z7982 Long term (current) use of aspirin: Secondary | ICD-10-CM | POA: Insufficient documentation

## 2015-09-17 DIAGNOSIS — Z85818 Personal history of malignant neoplasm of other sites of lip, oral cavity, and pharynx: Secondary | ICD-10-CM | POA: Insufficient documentation

## 2015-09-17 DIAGNOSIS — I2510 Atherosclerotic heart disease of native coronary artery without angina pectoris: Secondary | ICD-10-CM | POA: Diagnosis not present

## 2015-09-17 DIAGNOSIS — Z8639 Personal history of other endocrine, nutritional and metabolic disease: Secondary | ICD-10-CM | POA: Diagnosis not present

## 2015-09-17 DIAGNOSIS — W19XXXA Unspecified fall, initial encounter: Secondary | ICD-10-CM

## 2015-09-17 NOTE — ED Provider Notes (Signed)
[facility] DEPT Provider Note   CSN: [id number] Arrival date & time: 09/17/15  1719  By signing my name below, I, Amy Hess, attest that this documentation has been prepared under the direction and in the presence of Deno Etienne, DO. Electronically Signed: Irene Hess, ED Scribe. 09/17/15. 11:18 PM.  History   Chief Complaint Chief Complaint  Patient presents with  . Fall   The history is provided by the patient. No language interpreter was used.  Fall  This is a new problem. The current episode started 6 to 12 hours ago. The problem occurs constantly. The problem has been gradually worsening. Associated symptoms include abdominal pain and headaches. Pertinent negatives include no chest pain and no shortness of breath. She has tried nothing for the symptoms.  HPI Comments: Amy Hess is a 82 y.o. female with a hx of [name], cancer, arthritis, and HTN brought in by EMS who presents to the Emergency Department complaining of a fall onset 6 hours ago. Pt reports that she fell onto her extended right hand when an escalator she was on malfunctioned. She reports associated right wrist pain, pain to the buttocks, mild abdominal pain, mild headache, and abrasions to the left lower leg. Pt was able to ambulate after the fall. She has not taken anything for her symptoms. Pt takes 81 mg aspirin daily. She denies hitting head, LOC, chest pain, hip pain, numbness, or weakness.    Past Medical History:  Diagnosis Date  . Arthritis   . [name] (coronary artery disease)   . Cancer [facility])    thyroid and mouth cancer 2013  . Hypertension     Patient Active Problem List   Diagnosis Date Noted  . Choledocholithiasis 06/28/2014  . Choledocholithiasis with obstruction 06/27/2014  . Rheumatoid arthritis ([place]) 06/27/2014  . HTN (hypertension), benign 06/27/2014  . Hypothyroidism 06/27/2014    Past Surgical History:  Procedure Laterality Date  . CHOLECYSTECTOMY    . ERCP N/A 06/28/2014   Procedure: ENDOSCOPIC RETROGRADE CHOLANGIOPANCREATOGRAPHY (ERCP);  Surgeon: Arta Silence, MD;  Location: [facility] ENDOSCOPY;  Service: Endoscopy;  Laterality: N/A;  . ESOPHAGOGASTRODUODENOSCOPY (EGD) WITH PROPOFOL N/A 08/19/2014   Procedure: ESOPHAGOGASTRODUODENOSCOPY (EGD) WITH PROPOFOL;  Surgeon: Arta Silence, MD;  Location: [facility] ENDOSCOPY;  Service: Endoscopy;  Laterality: N/A;  . GASTROINTESTINAL STENT REMOVAL N/A 08/19/2014   Procedure: GASTROINTESTINAL STENT REMOVAL;  Surgeon: Arta Silence, MD;  Location: [facility] ENDOSCOPY;  Service: Endoscopy;  Laterality: N/A;  . TOTAL THYROIDECTOMY      OB History    No data available       Home Medications    Prior to Admission medications   Medication Sig Start Date End Date Taking? Authorizing Provider  amLODipine (NORVASC) 5 MG tablet Take 5 mg by mouth every morning.  03/22/14  Yes Historical Provider, MD  aspirin EC 81 MG tablet Take 81 mg by mouth daily.   Yes Historical Provider, MD  Calcium Carb-Cholecalciferol (CALCIUM 600 + D PO) Take 1 tablet by mouth daily.   Yes Historical Provider, MD  folic acid (FOLVITE) 1 MG tablet Take 1 mg by mouth daily.  02/18/14  Yes Historical Provider, MD  KLOR-CON M10 10 MEQ tablet Take 10 mEq by mouth daily. 06/16/14  Yes Historical Provider, MD  levothyroxine (SYNTHROID, LEVOTHROID) 88 MCG tablet Take 88 mcg by mouth daily. 06/08/14  Yes Historical Provider, MD  Multiple Vitamin (MULTIVITAMIN) capsule Take 1 capsule by mouth daily.    Yes Historical Provider, MD  predniSONE (DELTASONE) 5 MG tablet Take  2.5 mg by mouth daily with breakfast.  05/27/14  Yes Historical Provider, MD  ranitidine (ZANTAC) 150 MG tablet Take 150 mg by mouth at bedtime.   Yes Historical Provider, MD  torsemide (DEMADEX) 5 MG tablet Take 5 mg by mouth every morning.  06/04/14  Yes Historical Provider, MD    Family History History reviewed. No pertinent family history.  Social History Social History  Substance Use Topics  . Smoking  status: Never Smoker  . Smokeless tobacco: Never Used  . Alcohol use No     Allergies   Dyazide [hydrochlorothiazide w-triamterene]; Plaquenil [hydroxychloroquine sulfate]; Sulfate; and Codeine   Review of Systems Review of Systems  Constitutional: Negative for chills and fever.  HENT: Negative for congestion and rhinorrhea.   Eyes: Negative for redness and visual disturbance.  Respiratory: Negative for shortness of breath and wheezing.   Cardiovascular: Negative for chest pain and palpitations.  Gastrointestinal: Positive for abdominal pain. Negative for nausea and vomiting.  Genitourinary: Negative for dysuria and urgency.  Musculoskeletal: Positive for arthralgias. Negative for myalgias.  Skin: Positive for wound. Negative for pallor.  Neurological: Positive for headaches. Negative for dizziness, syncope, weakness and numbness.     Physical Exam Updated Vital Signs BP 162/98   Pulse 72   Temp 97.8 F (36.6 C) (Oral)   Resp 16   Ht 5\' 4"  (1.626 m)   Wt 111 lb (50.3 kg)   SpO2 99%   BMI 19.05 kg/m   Physical Exam  Constitutional: She is oriented to person, place, and time. She appears well-developed and well-nourished. No distress.  HENT:  Head: Normocephalic and atraumatic.  Eyes: EOM are normal. Pupils are equal, round, and reactive to light.  Neck: Normal range of motion. Neck supple.  Cardiovascular: Normal rate and regular rhythm.  Exam reveals no gallop and no friction rub.   No murmur heard. Pulmonary/Chest: Effort normal. She has no wheezes. She has no rales.  Abdominal: Soft. She exhibits no distension. There is no tenderness.  Musculoskeletal: She exhibits no edema or tenderness.  Hematoma in the webspace of the right hand, no bony tenderness throughout the hand, no scaphoid tenderness, pulse, motor and sensation intact to RUE.  Hematoma to bilateral buttocks, no pain with internal or external rotation of the hip bilaterally  Neurological: She is alert  and oriented to person, place, and time.  Skin: Skin is warm and dry. She is not diaphoretic.  Psychiatric: She has a normal mood and affect. Her behavior is normal.  Nursing note and vitals reviewed.    ED Treatments / Results   DIAGNOSTIC STUDIES: Oxygen Saturation is 100% on RA, normal by my interpretation.    COORDINATION OF CARE: 11:18 PM-Discussed treatment plan which includes x-ray with pt at bedside and pt agreed to plan.   Labs (all labs ordered are listed, but only abnormal results are displayed) Labs Reviewed - No data to display  EKG  EKG Interpretation None       Radiology Dg Wrist Complete Right  Result Date: 09/17/2015 CLINICAL DATA:  [address] on escalator. Right posterior and lateral wrist pain and bruising. History of rheumatoid arthritis. EXAM: RIGHT WRIST - COMPLETE 3+ VIEW COMPARISON:  None. FINDINGS: Diffuse bone demineralization. Severe bone erosions with narrowed joint spaces involving the radiocarpal, STT, and intercarpal joints. Large erosion along the radial side of the radial ulnar joint. Subcortical cysts in the scaphoid bone. Diffuse coarsening of the trabecula likely due to the chronic process. No evidence of acute fracture  or dislocation. Soft tissues appear intact. IMPRESSION: Chronic erosive changes and joint space loss in the carpus consistent with history of rheumatoid arthritis. Diffuse coarsening of trabecular pattern likely due to demineralization and chronic disease. No acute fractures are identified. Electronically Signed   By: Lucienne Capers M.D.   On: 09/17/2015 18:34   Dg Pelvis Portable  Result Date: 09/18/2015 CLINICAL DATA:  pt was on escalator when it malfunctioned causing her to fall. Pt reports right wrist pain and abrasions to back of right leg. No LOC. VItals stable EXAM: PORTABLE PELVIS 1-2 VIEWS COMPARISON:  None. FINDINGS: There is no evidence of pelvic fracture or diastasis. No pelvic bone lesions are seen. Surgical clips in the  right groin region. IMPRESSION: Negative. Electronically Signed   By: Lucienne Capers M.D.   On: 09/18/2015 00:11    Procedures Procedures (including critical care time)  Medications Ordered in ED Medications - No data to display   Initial Impression / Assessment and Plan / ED Course  I have reviewed the triage vital signs and the nursing notes.  Pertinent labs & imaging results that were available during my care of the patient were reviewed by me and considered in my medical decision making (see chart for details).  Clinical Course    82 yo F with a fall on an escalator.  Complaining mostly of buttock and left wrist pain.  No bony TTP.  Some mild bruising.  Xray negative.  D/c home.   4:58 AM:  I have discussed the diagnosis/risks/treatment options with the patient and family and believe the pt to be eligible for discharge home to follow-up with PCP. We also discussed returning to the ED immediately if new or worsening sx occur. We discussed the sx which are most concerning (e.g., sudden worsening pain, fever, inability to tolerate by mouth) that necessitate immediate return. Medications administered to the patient during their visit and any new prescriptions provided to the patient are listed below.  Medications given during this visit Medications - No data to display   The patient appears reasonably screen and/or stabilized for discharge and I doubt any other medical condition or other [facility] requiring further screening, evaluation, or treatment in the ED at this time prior to discharge.    Final Clinical Impressions(s) / ED Diagnoses   Final diagnoses:  Fall, initial encounter  Hematoma    New Prescriptions Discharge Medication List as of 09/18/2015 12:39 AM       Deno Etienne, DO 09/18/15 [id number]

## 2015-09-17 NOTE — ED Triage Notes (Signed)
[facility]- pt was on escalator when it malfunctioned causing her to fall. Pt reports right wrist pain and abrasions to back of right leg. No LOC. VItals stable.

## 2015-10-07 ENCOUNTER — Ambulatory Visit: Payer: Medicare Other | Admitting: Radiation Oncology

## 2015-10-12 ENCOUNTER — Ambulatory Visit: Payer: Medicare Other | Admitting: Radiation Oncology

## 2015-10-18 ENCOUNTER — Ambulatory Visit
Admission: RE | Admit: 2015-10-18 | Discharge: 2015-10-18 | Disposition: A | Discharge status: Home / Self Care | Payer: Medicare Other | Source: Ambulatory Visit | Attending: Radiation Oncology | Admitting: Radiation Oncology

## 2015-10-18 ENCOUNTER — Encounter: Payer: Self-pay | Admitting: Radiation Oncology

## 2015-10-18 VITALS — BP 141/100 | HR 120 | Temp 97.0°F | Resp 18 | Wt 112.2 lb

## 2015-10-18 DIAGNOSIS — Z85810 Personal history of malignant neoplasm of tongue: Secondary | ICD-10-CM | POA: Diagnosis present

## 2015-10-18 DIAGNOSIS — C01 Malignant neoplasm of base of tongue: Secondary | ICD-10-CM

## 2015-10-18 DIAGNOSIS — Z923 Personal history of irradiation: Secondary | ICD-10-CM | POA: Diagnosis not present

## 2015-10-18 NOTE — Progress Notes (Signed)
Radiation Oncology Follow up Note  Name: Amy Hess   Date:   10/18/2015 MRN:  [id number] DOB: 1928-10-27    This 82 y.o. female presents to the clinic today for close to 5 year follow-up for head and neck cancer.  REFERRING PROVIDER: Kirk Ruths, MD  HPI: Patient is a 82 year old female now close to 5 years out having completed IM RT radiation therapy for a T2 N0 squamous cell carcinoma of the oral tongue. Seen today in routine follow-up she is doing well. She specifically denies head and neck pain dysphasia any change in taste or dry mouth. She does continue to sing with her choir..  COMPLICATIONS OF TREATMENT: None  FOLLOW UP COMPLIANCE: keeps appointments   PHYSICAL EXAM:  BP (!) 141/100   Pulse (!) 120   Temp 97 F (36.1 C)   Resp 18   Wt 112 lb 3.4 oz (50.9 kg)   BMI 19.26 kg/m  Oral cavity is clear with no oral mucosal lesions are identified. Indirect mirror examination shows upper airway clear vallecula and base of tongue within normal limits. No evidence of subject gastric cervical or supraclavicular adenopathy is identified. Is a very small lymph node present posterior to her year nontender may be slightly inflammatory in nature. Well-developed well-nourished patient in NAD. HEENT reveals PERLA, EOMI, discs not visualized.  Oral cavity is clear. No oral mucosal lesions are identified. Neck is clear without evidence of cervical or supraclavicular adenopathy. Lungs are clear to A&P. Cardiac examination is essentially unremarkable with regular rate and rhythm without murmur rub or thrill. Abdomen is benign with no organomegaly or masses noted. Motor sensory and DTR levels are equal and symmetric in the upper and lower extremities. Cranial nerves II through XII are grossly intact. Proprioception is intact. No peripheral adenopathy or edema is identified. No motor or sensory levels are noted. Crude visual fields are within normal range.  RADIOLOGY RESULTS: No  current films for review  PLAN: At the present time patient is now out close to 5 years with no evidence of disease. She continues close follow-up care with Dr. Carlis Abbott ENT. I'm going to discontinue follow-up care on her. I will be happy to reevaluate her any time should further treatment be indicated.  I would like to take this opportunity to thank you for allowing me to participate in the care of your patient.Armstead Peaks., MD

## 2015-11-18 DIAGNOSIS — M1711 Unilateral primary osteoarthritis, right knee: Secondary | ICD-10-CM | POA: Insufficient documentation

## 2015-11-19 ENCOUNTER — Other Ambulatory Visit: Payer: Self-pay | Admitting: Unknown Physician Specialty

## 2015-11-19 DIAGNOSIS — M1711 Unilateral primary osteoarthritis, right knee: Secondary | ICD-10-CM

## 2015-12-28 ENCOUNTER — Ambulatory Visit: Admission: RE | Admit: 2015-12-28 | Payer: Medicare Other | Source: Ambulatory Visit

## 2016-01-12 ENCOUNTER — Ambulatory Visit
Admission: RE | Admit: 2016-01-12 | Discharge: 2016-01-12 | Disposition: A | Discharge status: Home / Self Care | Payer: Medicare Other | Source: Ambulatory Visit | Attending: Unknown Physician Specialty | Admitting: Unknown Physician Specialty

## 2016-01-12 DIAGNOSIS — M1711 Unilateral primary osteoarthritis, right knee: Secondary | ICD-10-CM | POA: Diagnosis present

## 2016-06-28 NOTE — Progress Notes (Signed)
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  INTERVAL HISTORY: Patient returns to clinic today for laboratory work and routine yearly evaluation.  She has noted some increased weight loss as well as insomnia. She otherwise feels well and is asymptomatic. She has a good appetite. She denies any chest pain or shortness of breath.  She denies any weakness or fatigue. She denies any fevers.  She denies any dysphasia.  She has no neurologic complaints.  She denies any nausea, vomiting, constipation, or diarrhea.  She has no urinary complaints.  Patient offers no further specific complaints today.  REVIEW OF SYSTEMS:   Review of Systems  Constitutional: Positive for weight loss. Negative for fever and malaise/fatigue.  HENT: Negative.  Negative for sore throat.   Respiratory: Negative for cough and shortness of breath.   Cardiovascular: Negative.  Negative for chest pain and leg swelling.  Gastrointestinal: Negative.  Negative for abdominal pain.  Genitourinary: Negative.   Musculoskeletal: Negative.   Skin: Negative.  Negative for rash.  Neurological: Negative.  Negative for focal weakness, weakness and headaches.  Psychiatric/Behavioral: The patient has insomnia. The patient is not nervous/anxious.     As per HPI. Otherwise, a complete review of systems is negatve.  PAST MEDICAL HISTORY: Past Medical History:  Diagnosis Date  . Arthritis   . [name] (coronary artery disease)   . Cancer [facility])    thyroid and mouth cancer 2013  . Hypertension     PAST SURGICAL HISTORY: Past Surgical History:  Procedure Laterality Date  . CHOLECYSTECTOMY    . ERCP N/A 06/28/2014   Procedure: ENDOSCOPIC RETROGRADE  CHOLANGIOPANCREATOGRAPHY (ERCP);  Surgeon: Arta Silence, MD;  Location: [facility] ENDOSCOPY;  Service: Endoscopy;  Laterality: N/A;  . ESOPHAGOGASTRODUODENOSCOPY (EGD) WITH PROPOFOL N/A 08/19/2014   Procedure: ESOPHAGOGASTRODUODENOSCOPY (EGD) WITH PROPOFOL;  Surgeon: Arta Silence, MD;  Location: [facility] ENDOSCOPY;  Service: Endoscopy;  Laterality: N/A;  . GASTROINTESTINAL STENT REMOVAL N/A 08/19/2014   Procedure: GASTROINTESTINAL STENT REMOVAL;  Surgeon: Arta Silence, MD;  Location: [facility] ENDOSCOPY;  Service: Endoscopy;  Laterality: N/A;  . TOTAL THYROIDECTOMY      FAMILY HISTORY: Reviewed and unchanged. No reported history of malignancy or chronic disease.     ADVANCED DIRECTIVES:    HEALTH MAINTENANCE: Social History  Substance Use Topics  . Smoking status: Never Smoker  . Smokeless tobacco: Never Used  . Alcohol use No     Colonoscopy:  PAP:  Bone density:  Lipid panel:  Allergies  Allergen Reactions  . Dyazide [Hydrochlorothiazide W-Triamterene] Other (See Comments)    On Chart ot MD office  . Plaquenil [Hydroxychloroquine Sulfate] Other (See Comments)    Not sure per MD office on chart   . Sulfate Other (See Comments)    Pt not sure  . Codeine Rash    Current Outpatient Prescriptions  Medication Sig Dispense Refill  . amLODipine (NORVASC) 5 MG tablet Take 5 mg by mouth every morning.   11  . aspirin EC 81 MG tablet Take 81 mg by mouth daily.    . Calcium Carb-Cholecalciferol (CALCIUM 600 + D PO) Take 1 tablet by mouth daily.    . folic acid (FOLVITE) 1 MG tablet Take 1 mg by mouth daily.     Marland Kitchen KLOR-CON  M10 10 MEQ tablet Take 10 mEq by mouth daily.  12  . levothyroxine (SYNTHROID, LEVOTHROID) 88 MCG tablet Take 88 mcg by mouth daily.  2  . Multiple Vitamin (MULTIVITAMIN) capsule Take 1 capsule by mouth daily.     . predniSONE (DELTASONE) 5 MG tablet Take 2.5 mg by mouth daily with breakfast.   1  . ranitidine (ZANTAC) 150 MG tablet Take 150 mg by mouth at bedtime.    .  torsemide (DEMADEX) 5 MG tablet Take 5 mg by mouth every morning.   3   No current facility-administered medications for this visit.     OBJECTIVE: Vitals:   06/29/16 1107  BP: (!) 135/91  Pulse: (!) 58  Resp: 20  Temp: 97.9 F (36.6 C)     Body mass index is 17.63 kg/m.    ECOG FS:0 - Asymptomatic  General: Well-developed, well-nourished, no acute distress. Eyes:anicteric sclera. HEENT: Normocephalic, moist mucous membranes, clear oropharnyx. No palpable lymphadenopathy. Lungs: Clear to auscultation bilaterally. Heart: Regular rate and rhythm. No rubs, murmurs, or gallops. Abdomen: Soft, nontender, nondistended. No organomegaly noted, normoactive bowel sounds. Musculoskeletal: No edema, cyanosis, or clubbing. Neuro: Alert, answering all questions appropriately. Cranial nerves grossly intact. Skin: No rashes or petechiae noted. Psych: Normal affect.  LAB RESULTS:  Lab Results  Component Value Date   NA 141 06/29/2014   K 3.0 (L) 06/29/2014   CL 108 06/29/2014   CO2 23 06/29/2014   GLUCOSE 73 06/29/2014   BUN <5 (L) 06/29/2014   CREATININE 0.67 06/29/2014   CALCIUM 8.2 (L) 06/29/2014   PROT 6.3 (L) 06/29/2014   ALBUMIN 3.2 (L) 06/29/2014   AST 74 (H) 06/29/2014   ALT 138 (H) 06/29/2014   ALKPHOS 130 (H) 06/29/2014   BILITOT 0.9 06/29/2014   GFRNONAA >60 06/29/2014   GFRAA >60 06/29/2014    Lab Results  Component Value Date   WBC 5.8 06/29/2016   NEUTROABS 3.9 06/29/2016   HGB 14.2 06/29/2016   HCT 41.6 06/29/2016   MCV 90.8 06/29/2016   PLT 192 06/29/2016     STUDIES: No results found.  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  PLAN:    1.  Head and neck cancer: No evidence of disease.  Patient reports she has an endoscopy scheduled with Dr. Nadeen Landau at [facility] in the next several months. No further imaging is necessary unless there is suspicion of recurrence.  Patient is now greater than 5 years  removed from completing her treatment can be discharged from clinic. Please refer her back if there are any questions or concerns.  2.  Thyroid cancer: Patient had her total thyroidectomy on Jun 22, 2011.  Continue Synthroid.  Antithyroglobulin antibodies are negative. Discharge from clinic as above. 3.  Probable meningioma: Repeat MRI is unchanged, no further imaging is necessary unless patient becomes symptomatic. 4. Weight loss/insomnia: Thyroid panel results forwarded to primary care, patient possibly may need adjustment of her Synthroid.  Patient expressed understanding and was in agreement with this plan. She also understands that She can call clinic at any time with any questions, concerns, or complaints.    Lloyd Huger, MD   07/07/2016 7:30 AM

## 2016-06-29 ENCOUNTER — Inpatient Hospital Stay: Payer: Medicare Other | Attending: Oncology

## 2016-06-29 ENCOUNTER — Inpatient Hospital Stay (HOSPITAL_BASED_OUTPATIENT_CLINIC_OR_DEPARTMENT_OTHER): Payer: Medicare Other | Admitting: Oncology

## 2016-06-29 DIAGNOSIS — Z79899 Other long term (current) drug therapy: Secondary | ICD-10-CM | POA: Insufficient documentation

## 2016-06-29 DIAGNOSIS — G4700 Insomnia, unspecified: Secondary | ICD-10-CM | POA: Insufficient documentation

## 2016-06-29 DIAGNOSIS — Z85850 Personal history of malignant neoplasm of thyroid: Secondary | ICD-10-CM

## 2016-06-29 DIAGNOSIS — I10 Essential (primary) hypertension: Secondary | ICD-10-CM | POA: Insufficient documentation

## 2016-06-29 DIAGNOSIS — Z7982 Long term (current) use of aspirin: Secondary | ICD-10-CM | POA: Diagnosis not present

## 2016-06-29 DIAGNOSIS — Z85810 Personal history of malignant neoplasm of tongue: Secondary | ICD-10-CM | POA: Insufficient documentation

## 2016-06-29 DIAGNOSIS — M129 Arthropathy, unspecified: Secondary | ICD-10-CM

## 2016-06-29 DIAGNOSIS — C73 Malignant neoplasm of thyroid gland: Secondary | ICD-10-CM | POA: Insufficient documentation

## 2016-06-29 DIAGNOSIS — R634 Abnormal weight loss: Secondary | ICD-10-CM

## 2016-06-29 DIAGNOSIS — C029 Malignant neoplasm of tongue, unspecified: Secondary | ICD-10-CM | POA: Insufficient documentation

## 2016-06-29 DIAGNOSIS — I2510 Atherosclerotic heart disease of native coronary artery without angina pectoris: Secondary | ICD-10-CM

## 2016-06-29 DIAGNOSIS — C760 Malignant neoplasm of head, face and neck: Secondary | ICD-10-CM

## 2016-06-29 LAB — TSH: TSH: 0.488 u[IU]/mL (ref 0.350–4.500)

## 2016-06-29 LAB — CBC WITH DIFFERENTIAL/PLATELET
BASOS PCT: 1 %
Basophils Absolute: 0 10*3/uL (ref 0–0.1)
Eosinophils Absolute: 0.1 10*3/uL (ref 0–0.7)
Eosinophils Relative: 1 %
HEMATOCRIT: 41.6 % (ref 35.0–47.0)
Hemoglobin: 14.2 g/dL (ref 12.0–16.0)
LYMPHS ABS: 1.1 10*3/uL (ref 1.0–3.6)
LYMPHS PCT: 19 %
MCH: 31 pg (ref 26.0–34.0)
MCHC: 34.1 g/dL (ref 32.0–36.0)
MCV: 90.8 fL (ref 80.0–100.0)
MONO ABS: 0.7 10*3/uL (ref 0.2–0.9)
MONOS PCT: 12 %
NEUTROS ABS: 3.9 10*3/uL (ref 1.4–6.5)
Neutrophils Relative %: 67 %
Platelets: 192 10*3/uL (ref 150–440)
RBC: 4.58 MIL/uL (ref 3.80–5.20)
RDW: 13.4 % (ref 11.5–14.5)
WBC: 5.8 10*3/uL (ref 3.6–11.0)

## 2016-06-29 LAB — T4, FREE: Free T4: 1.45 ng/dL — ABNORMAL HIGH (ref 0.61–1.12)

## 2016-06-29 NOTE — Progress Notes (Signed)
Patient reports weight loss.

## 2016-06-30 LAB — THYROGLOBULIN ANTIBODY

## 2016-07-04 ENCOUNTER — Telehealth: Payer: Self-pay | Admitting: *Deleted

## 2016-07-04 NOTE — Telephone Encounter (Signed)
States she had thyroid levels checked Friday and has not heard back if she needs a dose change, meanwhile she saw Dr Ouida Sills who also checked her thyroid levels and he recommends changing her dose from 88 mcg to 75 mcg. She asks that you confer and call her back that this is the correct dose   Next appt:  None  Dx:  Head and neck cancer (HCC)   Ref Range & Units 5d ago  Free T4 0.61 - 1.12 ng/dL 1.45         Dx:  Head and neck cancer (HCC)   Ref Range & Units 5d ago  TSH 0.350 - 4.500 uIU/mL 0.488        Dx:  Head and neck cancer (HCC)   Ref Range & Units 5d ago  Thyroglobulin Antibody 0.0 - 0.9 IU/mL <1.0

## 2016-07-04 NOTE — Telephone Encounter (Signed)
Agree with Dr. Ouida Sills.

## 2016-07-05 NOTE — Telephone Encounter (Signed)
Returned call to [place] and advised Dr Grayland Ormond agrees with Dr Ouida Sills on dose

## 2017-06-06 ENCOUNTER — Other Ambulatory Visit: Payer: Self-pay

## 2017-06-06 ENCOUNTER — Encounter
Admission: RE | Admit: 2017-06-06 | Discharge: 2017-06-06 | Disposition: A | Discharge status: Home / Self Care | Payer: Medicare Other | Source: Ambulatory Visit | Attending: Orthopedic Surgery | Admitting: Orthopedic Surgery

## 2017-06-06 DIAGNOSIS — Z01810 Encounter for preprocedural cardiovascular examination: Secondary | ICD-10-CM

## 2017-06-06 DIAGNOSIS — I10 Essential (primary) hypertension: Secondary | ICD-10-CM | POA: Insufficient documentation

## 2017-06-06 DIAGNOSIS — Z01812 Encounter for preprocedural laboratory examination: Secondary | ICD-10-CM | POA: Diagnosis present

## 2017-06-06 DIAGNOSIS — I2510 Atherosclerotic heart disease of native coronary artery without angina pectoris: Secondary | ICD-10-CM | POA: Insufficient documentation

## 2017-06-06 HISTORY — DX: Gastro-esophageal reflux disease without esophagitis: K21.9

## 2017-06-06 HISTORY — DX: Anemia, unspecified: D64.9

## 2017-06-06 HISTORY — DX: Dermatitis, unspecified: L30.9

## 2017-06-06 HISTORY — DX: Rheumatoid arthritis, unspecified: M06.9

## 2017-06-06 HISTORY — DX: Diverticulosis of intestine, part unspecified, without perforation or abscess without bleeding: K57.90

## 2017-06-06 HISTORY — DX: Unspecified osteoarthritis, unspecified site: M19.90

## 2017-06-06 HISTORY — DX: Personal history of urinary calculi: Z87.442

## 2017-06-06 LAB — CBC
HEMATOCRIT: 42.9 % (ref 35.0–47.0)
HEMOGLOBIN: 14.9 g/dL (ref 12.0–16.0)
MCH: 32.3 pg (ref 26.0–34.0)
MCHC: 34.7 g/dL (ref 32.0–36.0)
MCV: 93.2 fL (ref 80.0–100.0)
Platelets: 199 10*3/uL (ref 150–440)
RBC: 4.6 MIL/uL (ref 3.80–5.20)
RDW: 13.7 % (ref 11.5–14.5)
WBC: 7.5 10*3/uL (ref 3.6–11.0)

## 2017-06-06 LAB — COMPREHENSIVE METABOLIC PANEL
ALBUMIN: 4.9 g/dL (ref 3.5–5.0)
ALK PHOS: 60 U/L (ref 38–126)
ALT: 24 U/L (ref 14–54)
AST: 36 U/L (ref 15–41)
Anion gap: 13 (ref 5–15)
BILIRUBIN TOTAL: 0.9 mg/dL (ref 0.3–1.2)
BUN: 14 mg/dL (ref 6–20)
CALCIUM: 9.4 mg/dL (ref 8.9–10.3)
CO2: 28 mmol/L (ref 22–32)
Chloride: 99 mmol/L — ABNORMAL LOW (ref 101–111)
Creatinine, Ser: 0.79 mg/dL (ref 0.44–1.00)
GFR calc Af Amer: >60 mL/min (ref >60)
GLUCOSE: 104 mg/dL — AB (ref 65–99)
POTASSIUM: 3.4 mmol/L — AB (ref 3.5–5.1)
Sodium: 140 mmol/L (ref 135–145)
TOTAL PROTEIN: 8.2 g/dL — AB (ref 6.5–8.1)

## 2017-06-06 LAB — C-REACTIVE PROTEIN

## 2017-06-06 LAB — APTT: APTT: 27 s (ref 24–36)

## 2017-06-06 LAB — URINALYSIS, ROUTINE W REFLEX MICROSCOPIC
Bilirubin Urine: NEGATIVE
Glucose, UA: NEGATIVE mg/dL
Hgb urine dipstick: NEGATIVE
Ketones, ur: NEGATIVE mg/dL
Leukocytes, UA: NEGATIVE
NITRITE: NEGATIVE
Protein, ur: NEGATIVE mg/dL
SPECIFIC GRAVITY, URINE: 1.006 (ref 1.005–1.030)
pH: 8 (ref 5.0–8.0)

## 2017-06-06 LAB — PROTIME-INR
INR: 0.89
PROTHROMBIN TIME: 12 s (ref 11.4–15.2)

## 2017-06-06 LAB — SURGICAL PCR SCREEN
MRSA, PCR: NEGATIVE
STAPHYLOCOCCUS AUREUS: NEGATIVE

## 2017-06-06 LAB — TYPE AND SCREEN
ABO/RH(D): O POS
Antibody Screen: NEGATIVE

## 2017-06-06 LAB — SEDIMENTATION RATE: SED RATE: 4 mm/h (ref 0–30)

## 2017-06-06 NOTE — Pre-Procedure Instructions (Signed)
PATIENT  HR 107/EKG AFIB  NO SYMPTOMS. AS INSTRUCTED BY DR Amie Critchley, PATIENT INSTRUCTED TO CALL DR ANDERSON'S OFFICE TODAY. REQUEST /EKG CALLED AND FAXED TO DR ANDERSON'S. ALSO SPOKE WITH TIFFANY AT DR HOOTEN'S.

## 2017-06-06 NOTE — Patient Instructions (Signed)
Your procedure is scheduled on: Wednesday, Jun 20, 2017 Report to Day Surgery on the 2nd floor of the [facility]. To find out your arrival time, please call [phone] between 1PM - 3PM on: Tuesday, Jun 19, 2017  REMEMBER: Instructions that are not followed completely may result in serious medical risk, up to and including death; or upon the discretion of your surgeon and anesthesiologist your surgery may need to be rescheduled.  Do not eat food after midnight the night before your procedure.  No gum chewing, lozengers or hard candies.  You may however, drink CLEAR liquids up to 2 hours before you are scheduled to arrive for your surgery. Do not drink anything within 2 hours of the start of your surgery.  Clear liquids include: - water  - apple juice without pulp - clear gatorade - black coffee or tea (Do NOT add anything to the coffee or tea) Do NOT drink anything that is not on this list.  No Alcohol for 24 hours before or after surgery.  No Smoking including e-cigarettes for 24 hours prior to surgery.  No chewable tobacco products for at least 6 hours prior to surgery.  No nicotine patches on the day of surgery.  On the morning of surgery brush your teeth with toothpaste and water, you may rinse your mouth with mouthwash if you wish. Do not swallow any toothpaste or mouthwash.  Notify your doctor if there is any change in your medical condition (cold, fever, infection).  Do not wear jewelry, make-up, hairpins, clips or nail polish.  Do not wear lotions, powders, or perfumes. You may wear deodorant.  Do not shave 48 hours prior to surgery. Men may shave face and neck.  Contacts and dentures may not be worn into surgery.  Do not bring valuables to the hospital, including drivers license, insurance or credit cards.  [facility] is not responsible for any belongings or valuables.   TAKE THESE MEDICATIONS THE MORNING OF SURGERY:  1.  Amlodipine 2.  Levothyroxine 3.   Ranitidine  Use CHG Soap as directed on instruction sheet.  On May 22 - Stop ASPIRIN and Anti-inflammatories (NSAIDS) such as Advil, Aleve, Ibuprofen, Motrin, Naproxen, Naprosyn and Aspirin based products such as Excedrin, Goodys Powder, BC Powder. (May take Tylenol or Acetaminophen if needed.)  On May 22 - Stop ANY OVER THE COUNTER supplements until after surgery. (flaxseed) (May continue Vitamin D, Vitamin B, and multivitamin.)  Wear comfortable clothing (specific to your surgery type) to the hospital.  Plan for stool softeners for home use.  If you are being admitted to the hospital overnight, leave your suitcase in the car. After surgery it may be brought to your room.  Please call [phone] if you have any questions about these instructions.

## 2017-06-07 LAB — URINE CULTURE
Culture: NO GROWTH
Special Requests: NORMAL

## 2017-06-08 DIAGNOSIS — I482 Chronic atrial fibrillation, unspecified: Secondary | ICD-10-CM | POA: Insufficient documentation

## 2017-06-19 MED ORDER — CEFAZOLIN SODIUM-DEXTROSE 2-4 GM/100ML-% IV SOLN
2.0000 g | INTRAVENOUS | Status: AC
  Administered 2017-06-20: 2 g via INTRAVENOUS

## 2017-06-19 MED ORDER — TRANEXAMIC ACID 1000 MG/10ML IV SOLN
1000.0000 mg | INTRAVENOUS | Status: AC
  Administered 2017-06-20: 1000 mg via INTRAVENOUS
  Filled 2017-06-19: qty 1100

## 2017-06-20 ENCOUNTER — Encounter
Admission: RE | Disposition: A | Discharge status: Home Health | Payer: Self-pay | Source: Ambulatory Visit | Attending: Orthopedic Surgery

## 2017-06-20 ENCOUNTER — Inpatient Hospital Stay: Payer: Medicare Other | Admitting: Anesthesiology

## 2017-06-20 ENCOUNTER — Inpatient Hospital Stay: Payer: Medicare Other

## 2017-06-20 ENCOUNTER — Inpatient Hospital Stay
Admission: RE | Admit: 2017-06-20 | Discharge: 2017-06-22 | DRG: 470 | Disposition: A | Discharge status: Home Health | Payer: Medicare Other | Source: Ambulatory Visit | Attending: Orthopedic Surgery | Admitting: Orthopedic Surgery

## 2017-06-20 ENCOUNTER — Other Ambulatory Visit: Payer: Self-pay

## 2017-06-20 ENCOUNTER — Encounter: Payer: Self-pay | Admitting: Orthopedic Surgery

## 2017-06-20 DIAGNOSIS — I2510 Atherosclerotic heart disease of native coronary artery without angina pectoris: Secondary | ICD-10-CM | POA: Diagnosis present

## 2017-06-20 DIAGNOSIS — Z90710 Acquired absence of both cervix and uterus: Secondary | ICD-10-CM

## 2017-06-20 DIAGNOSIS — D649 Anemia, unspecified: Secondary | ICD-10-CM | POA: Insufficient documentation

## 2017-06-20 DIAGNOSIS — M069 Rheumatoid arthritis, unspecified: Secondary | ICD-10-CM | POA: Diagnosis present

## 2017-06-20 DIAGNOSIS — Z8249 Family history of ischemic heart disease and other diseases of the circulatory system: Secondary | ICD-10-CM

## 2017-06-20 DIAGNOSIS — M1711 Unilateral primary osteoarthritis, right knee: Principal | ICD-10-CM | POA: Diagnosis present

## 2017-06-20 DIAGNOSIS — Z923 Personal history of irradiation: Secondary | ICD-10-CM

## 2017-06-20 DIAGNOSIS — Z882 Allergy status to sulfonamides status: Secondary | ICD-10-CM | POA: Diagnosis not present

## 2017-06-20 DIAGNOSIS — Z85810 Personal history of malignant neoplasm of tongue: Secondary | ICD-10-CM | POA: Diagnosis not present

## 2017-06-20 DIAGNOSIS — L309 Dermatitis, unspecified: Secondary | ICD-10-CM | POA: Insufficient documentation

## 2017-06-20 DIAGNOSIS — E890 Postprocedural hypothyroidism: Secondary | ICD-10-CM | POA: Diagnosis present

## 2017-06-20 DIAGNOSIS — E785 Hyperlipidemia, unspecified: Secondary | ICD-10-CM | POA: Diagnosis present

## 2017-06-20 DIAGNOSIS — Z7952 Long term (current) use of systemic steroids: Secondary | ICD-10-CM | POA: Diagnosis not present

## 2017-06-20 DIAGNOSIS — Z87442 Personal history of urinary calculi: Secondary | ICD-10-CM | POA: Diagnosis not present

## 2017-06-20 DIAGNOSIS — K5790 Diverticulosis of intestine, part unspecified, without perforation or abscess without bleeding: Secondary | ICD-10-CM | POA: Diagnosis present

## 2017-06-20 DIAGNOSIS — I10 Essential (primary) hypertension: Secondary | ICD-10-CM | POA: Diagnosis present

## 2017-06-20 DIAGNOSIS — Z885 Allergy status to narcotic agent status: Secondary | ICD-10-CM

## 2017-06-20 DIAGNOSIS — T40605A Adverse effect of unspecified narcotics, initial encounter: Secondary | ICD-10-CM | POA: Diagnosis not present

## 2017-06-20 DIAGNOSIS — R410 Disorientation, unspecified: Secondary | ICD-10-CM | POA: Diagnosis not present

## 2017-06-20 DIAGNOSIS — Z7983 Long term (current) use of bisphosphonates: Secondary | ICD-10-CM

## 2017-06-20 DIAGNOSIS — Z96659 Presence of unspecified artificial knee joint: Secondary | ICD-10-CM

## 2017-06-20 DIAGNOSIS — Z888 Allergy status to other drugs, medicaments and biological substances status: Secondary | ICD-10-CM

## 2017-06-20 DIAGNOSIS — Z79890 Hormone replacement therapy (postmenopausal): Secondary | ICD-10-CM

## 2017-06-20 DIAGNOSIS — K219 Gastro-esophageal reflux disease without esophagitis: Secondary | ICD-10-CM | POA: Diagnosis present

## 2017-06-20 DIAGNOSIS — D709 Neutropenia, unspecified: Secondary | ICD-10-CM | POA: Insufficient documentation

## 2017-06-20 DIAGNOSIS — Z7982 Long term (current) use of aspirin: Secondary | ICD-10-CM | POA: Diagnosis not present

## 2017-06-20 DIAGNOSIS — Z7901 Long term (current) use of anticoagulants: Secondary | ICD-10-CM

## 2017-06-20 DIAGNOSIS — G500 Trigeminal neuralgia: Secondary | ICD-10-CM | POA: Diagnosis present

## 2017-06-20 DIAGNOSIS — M25561 Pain in right knee: Secondary | ICD-10-CM | POA: Diagnosis present

## 2017-06-20 DIAGNOSIS — I4891 Unspecified atrial fibrillation: Secondary | ICD-10-CM | POA: Diagnosis present

## 2017-06-20 DIAGNOSIS — Z85850 Personal history of malignant neoplasm of thyroid: Secondary | ICD-10-CM

## 2017-06-20 HISTORY — PX: KNEE ARTHROPLASTY: SHX992

## 2017-06-20 LAB — ABO/RH: ABO/RH(D): O POS

## 2017-06-20 SURGERY — ARTHROPLASTY, KNEE, TOTAL, USING IMAGELESS COMPUTER-ASSISTED NAVIGATION
Anesthesia: Spinal | Laterality: Right | Wound class: Clean

## 2017-06-20 MED ORDER — BUPIVACAINE HCL (PF) 0.25 % IJ SOLN
INTRAMUSCULAR | Status: DC | PRN
  Administered 2017-06-20: 60 mL

## 2017-06-20 MED ORDER — LACTATED RINGERS IV SOLN
INTRAVENOUS | Status: DC
  Administered 2017-06-20 (×2): via INTRAVENOUS

## 2017-06-20 MED ORDER — DIPHENHYDRAMINE HCL 12.5 MG/5ML PO ELIX
12.5000 mg | ORAL_SOLUTION | ORAL | Status: DC | PRN
  Administered 2017-06-20: 25 mg via ORAL
  Filled 2017-06-20: qty 10

## 2017-06-20 MED ORDER — ACETAMINOPHEN 10 MG/ML IV SOLN
INTRAVENOUS | Status: AC
  Filled 2017-06-20: qty 100

## 2017-06-20 MED ORDER — CALCIUM CARBONATE-VITAMIN D 500-200 MG-UNIT PO TABS
1.0000 | ORAL_TABLET | Freq: Every day | ORAL | Status: DC
  Administered 2017-06-21 – 2017-06-22 (×2): 1 via ORAL
  Filled 2017-06-20 (×2): qty 1

## 2017-06-20 MED ORDER — ACETAMINOPHEN 10 MG/ML IV SOLN
1000.0000 mg | Freq: Four times a day (QID) | INTRAVENOUS | Status: AC
  Administered 2017-06-20 – 2017-06-21 (×3): 1000 mg via INTRAVENOUS
  Filled 2017-06-20 (×4): qty 100

## 2017-06-20 MED ORDER — GABAPENTIN 300 MG PO CAPS
300.0000 mg | ORAL_CAPSULE | Freq: Once | ORAL | Status: AC
  Administered 2017-06-20: 300 mg via ORAL

## 2017-06-20 MED ORDER — ACETAMINOPHEN 325 MG PO TABS
325.0000 mg | ORAL_TABLET | Freq: Four times a day (QID) | ORAL | Status: DC | PRN
Start: 2017-06-21 — End: 2017-06-22
  Administered 2017-06-21: 650 mg via ORAL
  Filled 2017-06-20: qty 2

## 2017-06-20 MED ORDER — SODIUM CHLORIDE 0.9 % IV SOLN
INTRAVENOUS | Status: DC
  Administered 2017-06-20: 13:00:00 via INTRAVENOUS

## 2017-06-20 MED ORDER — TRANEXAMIC ACID 1000 MG/10ML IV SOLN
1000.0000 mg | Freq: Once | INTRAVENOUS | Status: AC
  Administered 2017-06-20: 1000 mg via INTRAVENOUS
  Filled 2017-06-20: qty 1100

## 2017-06-20 MED ORDER — FENTANYL CITRATE (PF) 100 MCG/2ML IJ SOLN: 25.0000 ug | INTRAMUSCULAR | Status: DC | PRN

## 2017-06-20 MED ORDER — FLEET ENEMA 7-19 GM/118ML RE ENEM: 1.0000 | ENEMA | Freq: Once | RECTAL | Status: DC | PRN

## 2017-06-20 MED ORDER — FENTANYL CITRATE (PF) 100 MCG/2ML IJ SOLN
INTRAMUSCULAR | Status: DC | PRN
  Administered 2017-06-20: 50 ug via INTRAVENOUS

## 2017-06-20 MED ORDER — CEFAZOLIN SODIUM-DEXTROSE 2-4 GM/100ML-% IV SOLN
INTRAVENOUS | Status: AC
  Filled 2017-06-20: qty 100

## 2017-06-20 MED ORDER — FENTANYL CITRATE (PF) 100 MCG/2ML IJ SOLN
INTRAMUSCULAR | Status: AC
  Filled 2017-06-20: qty 2

## 2017-06-20 MED ORDER — LEVOTHYROXINE SODIUM 75 MCG PO TABS
75.0000 ug | ORAL_TABLET | Freq: Every day | ORAL | Status: DC
  Administered 2017-06-21: 75 ug via ORAL
  Filled 2017-06-20: qty 3
  Filled 2017-06-20 (×2): qty 1

## 2017-06-20 MED ORDER — ADULT MULTIVITAMIN W/MINERALS CH
1.0000 | ORAL_TABLET | Freq: Every day | ORAL | Status: DC
Start: 2017-06-20 — End: 2017-06-22
  Administered 2017-06-21 – 2017-06-22 (×2): 1 via ORAL
  Filled 2017-06-20 (×2): qty 1

## 2017-06-20 MED ORDER — SENNOSIDES-DOCUSATE SODIUM 8.6-50 MG PO TABS
1.0000 | ORAL_TABLET | Freq: Two times a day (BID) | ORAL | Status: DC
  Administered 2017-06-20 – 2017-06-22 (×4): 1 via ORAL
  Filled 2017-06-20 (×4): qty 1

## 2017-06-20 MED ORDER — CHLORHEXIDINE GLUCONATE 4 % EX LIQD: 60.0000 mL | Freq: Once | CUTANEOUS | Status: DC

## 2017-06-20 MED ORDER — POTASSIUM CHLORIDE CRYS ER 10 MEQ PO TBCR
10.0000 meq | EXTENDED_RELEASE_TABLET | Freq: Every day | ORAL | Status: DC
Start: 2017-06-20 — End: 2017-06-22
  Administered 2017-06-21 – 2017-06-22 (×2): 10 meq via ORAL
  Filled 2017-06-20 (×2): qty 1

## 2017-06-20 MED ORDER — METOCLOPRAMIDE HCL 10 MG PO TABS: 5.0000 mg | ORAL_TABLET | Freq: Three times a day (TID) | ORAL | Status: DC | PRN

## 2017-06-20 MED ORDER — ONDANSETRON HCL 4 MG PO TABS: 4.0000 mg | ORAL_TABLET | Freq: Four times a day (QID) | ORAL | Status: DC | PRN

## 2017-06-20 MED ORDER — AMLODIPINE BESYLATE 5 MG PO TABS
5.0000 mg | ORAL_TABLET | Freq: Every morning | ORAL | Status: DC
  Administered 2017-06-21: 5 mg via ORAL
  Filled 2017-06-20 (×2): qty 1

## 2017-06-20 MED ORDER — CELECOXIB 200 MG PO CAPS
ORAL_CAPSULE | ORAL | Status: AC
  Administered 2017-06-20: 400 mg via ORAL
  Filled 2017-06-20: qty 2

## 2017-06-20 MED ORDER — TORSEMIDE 10 MG PO TABS
5.0000 mg | ORAL_TABLET | Freq: Every morning | ORAL | Status: DC
  Administered 2017-06-20 – 2017-06-22 (×3): 5 mg via ORAL
  Filled 2017-06-20 (×3): qty 0.5

## 2017-06-20 MED ORDER — MAGNESIUM HYDROXIDE 400 MG/5ML PO SUSP
30.0000 mL | Freq: Every day | ORAL | Status: DC
  Administered 2017-06-22: 30 mL via ORAL
  Filled 2017-06-20: qty 30

## 2017-06-20 MED ORDER — DEXAMETHASONE SODIUM PHOSPHATE 10 MG/ML IJ SOLN
INTRAMUSCULAR | Status: AC
  Administered 2017-06-20: 8 mg via INTRAVENOUS
  Filled 2017-06-20: qty 1

## 2017-06-20 MED ORDER — SODIUM CHLORIDE 0.9 % IV SOLN
INTRAVENOUS | Status: DC | PRN
  Administered 2017-06-20: 30 ug/min via INTRAVENOUS

## 2017-06-20 MED ORDER — CELECOXIB 200 MG PO CAPS
200.0000 mg | ORAL_CAPSULE | Freq: Two times a day (BID) | ORAL | Status: DC
  Administered 2017-06-20 – 2017-06-22 (×4): 200 mg via ORAL
  Filled 2017-06-20 (×4): qty 1

## 2017-06-20 MED ORDER — BUPIVACAINE HCL (PF) 0.5 % IJ SOLN
INTRAMUSCULAR | Status: DC | PRN
  Administered 2017-06-20: 3 mL

## 2017-06-20 MED ORDER — PREDNISONE 10 MG PO TABS
5.0000 mg | ORAL_TABLET | Freq: Every day | ORAL | Status: DC
  Administered 2017-06-21 – 2017-06-22 (×2): 5 mg via ORAL
  Filled 2017-06-20 (×2): qty 1

## 2017-06-20 MED ORDER — RIVAROXABAN 20 MG PO TABS
20.0000 mg | ORAL_TABLET | Freq: Every day | ORAL | Status: DC
  Administered 2017-06-21 – 2017-06-22 (×2): 20 mg via ORAL
  Filled 2017-06-20 (×2): qty 1

## 2017-06-20 MED ORDER — OXYCODONE HCL 5 MG PO TABS
5.0000 mg | ORAL_TABLET | ORAL | Status: DC | PRN
  Administered 2017-06-20 (×2): 5 mg via ORAL
  Filled 2017-06-20 (×2): qty 1

## 2017-06-20 MED ORDER — GABAPENTIN 300 MG PO CAPS
ORAL_CAPSULE | ORAL | Status: AC
  Administered 2017-06-20: 300 mg via ORAL
  Filled 2017-06-20: qty 1

## 2017-06-20 MED ORDER — PROPOFOL 10 MG/ML IV BOLUS
INTRAVENOUS | Status: AC
  Filled 2017-06-20: qty 20

## 2017-06-20 MED ORDER — CARVEDILOL 3.125 MG PO TABS
3.1250 mg | ORAL_TABLET | Freq: Two times a day (BID) | ORAL | Status: DC
  Administered 2017-06-20 – 2017-06-22 (×3): 3.125 mg via ORAL
  Filled 2017-06-20 (×4): qty 1

## 2017-06-20 MED ORDER — OXYCODONE HCL 5 MG/5ML PO SOLN: 5.0000 mg | Freq: Once | ORAL | Status: DC | PRN

## 2017-06-20 MED ORDER — NEOMYCIN-POLYMYXIN B GU 40-200000 IR SOLN
Status: DC | PRN
  Administered 2017-06-20: 14 mL

## 2017-06-20 MED ORDER — CELECOXIB 200 MG PO CAPS
400.0000 mg | ORAL_CAPSULE | Freq: Once | ORAL | Status: AC
  Administered 2017-06-20: 400 mg via ORAL

## 2017-06-20 MED ORDER — PROPOFOL 500 MG/50ML IV EMUL
INTRAVENOUS | Status: AC
  Filled 2017-06-20: qty 50

## 2017-06-20 MED ORDER — MENTHOL 3 MG MT LOZG: 1.0000 | LOZENGE | OROMUCOSAL | Status: DC | PRN

## 2017-06-20 MED ORDER — LIDOCAINE HCL (PF) 2 % IJ SOLN
INTRAMUSCULAR | Status: AC
  Filled 2017-06-20: qty 10

## 2017-06-20 MED ORDER — PROPOFOL 10 MG/ML IV BOLUS
INTRAVENOUS | Status: DC | PRN
  Administered 2017-06-20 (×2): 10 mg via INTRAVENOUS
  Administered 2017-06-20 (×5): 9.2 mg via INTRAVENOUS

## 2017-06-20 MED ORDER — PROPOFOL 500 MG/50ML IV EMUL
INTRAVENOUS | Status: DC | PRN
  Administered 2017-06-20: 50 ug/kg/min via INTRAVENOUS

## 2017-06-20 MED ORDER — BUPIVACAINE HCL (PF) 0.5 % IJ SOLN
INTRAMUSCULAR | Status: AC
  Filled 2017-06-20: qty 10

## 2017-06-20 MED ORDER — OXYCODONE HCL 5 MG PO TABS: 5.0000 mg | ORAL_TABLET | Freq: Once | ORAL | Status: DC | PRN

## 2017-06-20 MED ORDER — OXYCODONE HCL 5 MG PO TABS: 10.0000 mg | ORAL_TABLET | ORAL | Status: DC | PRN

## 2017-06-20 MED ORDER — PANTOPRAZOLE SODIUM 40 MG PO TBEC
40.0000 mg | DELAYED_RELEASE_TABLET | Freq: Two times a day (BID) | ORAL | Status: DC
  Administered 2017-06-20 – 2017-06-22 (×4): 40 mg via ORAL
  Filled 2017-06-20 (×4): qty 1

## 2017-06-20 MED ORDER — PHENYLEPHRINE HCL 10 MG/ML IJ SOLN
INTRAMUSCULAR | Status: AC
  Filled 2017-06-20: qty 1

## 2017-06-20 MED ORDER — FOLIC ACID 1 MG PO TABS
1.0000 mg | ORAL_TABLET | Freq: Every day | ORAL | Status: DC
  Administered 2017-06-21 – 2017-06-22 (×2): 1 mg via ORAL
  Filled 2017-06-20 (×2): qty 1

## 2017-06-20 MED ORDER — SODIUM CHLORIDE 0.9 % IV SOLN
INTRAVENOUS | Status: DC | PRN
  Administered 2017-06-20: 60 mL

## 2017-06-20 MED ORDER — GABAPENTIN 300 MG PO CAPS
300.0000 mg | ORAL_CAPSULE | Freq: Every day | ORAL | Status: DC
  Administered 2017-06-20 – 2017-06-21 (×2): 300 mg via ORAL
  Filled 2017-06-20 (×2): qty 1

## 2017-06-20 MED ORDER — ACETAMINOPHEN 10 MG/ML IV SOLN
INTRAVENOUS | Status: DC | PRN
  Administered 2017-06-20: 1000 mg via INTRAVENOUS

## 2017-06-20 MED ORDER — DEXAMETHASONE SODIUM PHOSPHATE 10 MG/ML IJ SOLN
8.0000 mg | Freq: Once | INTRAMUSCULAR | Status: AC
  Administered 2017-06-20: 8 mg via INTRAVENOUS

## 2017-06-20 MED ORDER — BISACODYL 10 MG RE SUPP: 10.0000 mg | Freq: Every day | RECTAL | Status: DC | PRN

## 2017-06-20 MED ORDER — CEFAZOLIN SODIUM-DEXTROSE 2-4 GM/100ML-% IV SOLN
2.0000 g | Freq: Four times a day (QID) | INTRAVENOUS | Status: AC
  Administered 2017-06-20 – 2017-06-21 (×4): 2 g via INTRAVENOUS
  Filled 2017-06-20 (×4): qty 100

## 2017-06-20 MED ORDER — PHENOL 1.4 % MT LIQD: 1.0000 | OROMUCOSAL | Status: DC | PRN

## 2017-06-20 MED ORDER — FERROUS SULFATE 325 (65 FE) MG PO TABS
325.0000 mg | ORAL_TABLET | Freq: Two times a day (BID) | ORAL | Status: DC
  Administered 2017-06-21 – 2017-06-22 (×3): 325 mg via ORAL
  Filled 2017-06-20 (×3): qty 1

## 2017-06-20 MED ORDER — HYDROMORPHONE HCL 1 MG/ML IJ SOLN: 0.5000 mg | INTRAMUSCULAR | Status: DC | PRN

## 2017-06-20 MED ORDER — ONDANSETRON HCL 4 MG/2ML IJ SOLN: 4.0000 mg | Freq: Four times a day (QID) | INTRAMUSCULAR | Status: DC | PRN

## 2017-06-20 MED ORDER — TRAMADOL HCL 50 MG PO TABS: 50.0000 mg | ORAL_TABLET | ORAL | Status: DC | PRN

## 2017-06-20 MED ORDER — ALUM & MAG HYDROXIDE-SIMETH 200-200-20 MG/5ML PO SUSP: 30.0000 mL | ORAL | Status: DC | PRN

## 2017-06-20 MED ORDER — METOCLOPRAMIDE HCL 5 MG/ML IJ SOLN: 5.0000 mg | Freq: Three times a day (TID) | INTRAMUSCULAR | Status: DC | PRN

## 2017-06-20 MED ORDER — METOCLOPRAMIDE HCL 10 MG PO TABS
10.0000 mg | ORAL_TABLET | Freq: Three times a day (TID) | ORAL | Status: DC
  Administered 2017-06-20 – 2017-06-22 (×6): 10 mg via ORAL
  Filled 2017-06-20 (×6): qty 1

## 2017-06-20 MED ORDER — CARVEDILOL 3.125 MG PO TABS
3.1250 mg | ORAL_TABLET | Freq: Two times a day (BID) | ORAL | Status: DC
  Administered 2017-06-20 – 2017-06-21 (×2): 3.125 mg via ORAL
  Filled 2017-06-20 (×2): qty 1

## 2017-06-20 SURGICAL SUPPLY — 64 items
BATTERY INSTRU NAVIGATION (MISCELLANEOUS) ×12 IMPLANT
BLADE SAGITTAL 25.0X1.19X90 (BLADE) ×2 IMPLANT
BLADE SAGITTAL 25.0X1.19X90MM (BLADE) ×1
BLADE SAW 1/2 (BLADE) ×3 IMPLANT
BLADE SAW 70X12.5 (BLADE) IMPLANT
BONE CEMENT GENTAMICIN (Cement) ×6 IMPLANT
CANISTER SUCT 1200ML W/VALVE (MISCELLANEOUS) ×3 IMPLANT
CANISTER SUCT 3000ML PPV (MISCELLANEOUS) ×6 IMPLANT
CAPT KNEE TOTAL 3 ATTUNE ×3 IMPLANT
CEMENT BONE GENTAMICIN 40 (Cement) ×2 IMPLANT
COOLER POLAR GLACIER W/PUMP (MISCELLANEOUS) ×3 IMPLANT
CUFF TOURN 24 STER (MISCELLANEOUS) ×3 IMPLANT
CUFF TOURN 30 STER DUAL PORT (MISCELLANEOUS) IMPLANT
DRAPE SHEET LG 3/4 BI-LAMINATE (DRAPES) ×3 IMPLANT
DRSG DERMACEA 8X12 NADH (GAUZE/BANDAGES/DRESSINGS) ×3 IMPLANT
DRSG OPSITE POSTOP 4X14 (GAUZE/BANDAGES/DRESSINGS) ×3 IMPLANT
DRSG TEGADERM 4X4.75 (GAUZE/BANDAGES/DRESSINGS) ×3 IMPLANT
DURAPREP 26ML APPLICATOR (WOUND CARE) ×6 IMPLANT
ELECT CAUTERY BLADE 6.4 (BLADE) ×3 IMPLANT
ELECT REM PT RETURN 9FT ADLT (ELECTROSURGICAL) ×3
ELECTRODE REM PT RTRN 9FT ADLT (ELECTROSURGICAL) ×1 IMPLANT
EX-PIN ORTHOLOCK NAV 4X150 (PIN) ×6 IMPLANT
GLOVE BIOGEL M STRL SZ7.5 (GLOVE) ×6 IMPLANT
GLOVE BIOGEL PI IND STRL 9 (GLOVE) ×1 IMPLANT
GLOVE BIOGEL PI INDICATOR 9 (GLOVE) ×2
GLOVE INDICATOR 8.0 STRL GRN (GLOVE) ×3 IMPLANT
GLOVE SURG SYN 9.0  PF PI (GLOVE) ×2
GLOVE SURG SYN 9.0 PF PI (GLOVE) ×1 IMPLANT
GOWN STRL REUS W/ TWL LRG LVL3 (GOWN DISPOSABLE) ×2 IMPLANT
GOWN STRL REUS W/TWL 2XL LVL3 (GOWN DISPOSABLE) ×3 IMPLANT
GOWN STRL REUS W/TWL LRG LVL3 (GOWN DISPOSABLE) ×4
HEMOVAC 400CC 10FR (MISCELLANEOUS) ×3 IMPLANT
HOLDER FOLEY CATH W/STRAP (MISCELLANEOUS) ×3 IMPLANT
HOOD PEEL AWAY FLYTE STAYCOOL (MISCELLANEOUS) ×6 IMPLANT
KIT TURNOVER KIT A (KITS) ×3 IMPLANT
KNIFE SCULPS 14X20 (INSTRUMENTS) ×3 IMPLANT
LABEL OR SOLS (LABEL) ×3 IMPLANT
NDL SAFETY ECLIPSE 18X1.5 (NEEDLE) ×1 IMPLANT
NEEDLE HYPO 18GX1.5 SHARP (NEEDLE) ×2
NEEDLE SPNL 20GX3.5 QUINCKE YW (NEEDLE) ×6 IMPLANT
NS IRRIG 500ML POUR BTL (IV SOLUTION) ×3 IMPLANT
PACK TOTAL KNEE (MISCELLANEOUS) ×3 IMPLANT
PAD WRAPON POLAR KNEE (MISCELLANEOUS) ×1 IMPLANT
PIN FIXATION 1/8DIA X 3INL (PIN) ×3 IMPLANT
PULSAVAC PLUS IRRIG FAN TIP (DISPOSABLE) ×3
SOL .9 NS 3000ML IRR  AL (IV SOLUTION) ×2
SOL .9 NS 3000ML IRR UROMATIC (IV SOLUTION) ×1 IMPLANT
SOL PREP PVP 2OZ (MISCELLANEOUS) ×3
SOLUTION PREP PVP 2OZ (MISCELLANEOUS) ×1 IMPLANT
SPONGE DRAIN TRACH 4X4 STRL 2S (GAUZE/BANDAGES/DRESSINGS) ×3 IMPLANT
STAPLER SKIN PROX 35W (STAPLE) ×3 IMPLANT
STRAP TIBIA SHORT (MISCELLANEOUS) ×3 IMPLANT
SUCTION FRAZIER HANDLE 10FR (MISCELLANEOUS) ×2
SUCTION TUBE FRAZIER 10FR DISP (MISCELLANEOUS) ×1 IMPLANT
SUT VIC AB 0 CT1 36 (SUTURE) ×3 IMPLANT
SUT VIC AB 1 CT1 36 (SUTURE) ×6 IMPLANT
SUT VIC AB 2-0 CT2 27 (SUTURE) ×3 IMPLANT
SYR 20CC LL (SYRINGE) ×3 IMPLANT
SYR 30ML LL (SYRINGE) ×6 IMPLANT
TIP FAN IRRIG PULSAVAC PLUS (DISPOSABLE) ×1 IMPLANT
TOWEL OR 17X26 4PK STRL BLUE (TOWEL DISPOSABLE) ×3 IMPLANT
TOWER CARTRIDGE SMART MIX (DISPOSABLE) ×3 IMPLANT
TRAY FOLEY MTR SLVR 16FR STAT (SET/KITS/TRAYS/PACK) ×3 IMPLANT
WRAPON POLAR PAD KNEE (MISCELLANEOUS) ×3

## 2017-06-20 NOTE — OR Nursing (Signed)
Anesthesia Dr. Juan Quam aware of B/P patient is ok going to the floor

## 2017-06-20 NOTE — Anesthesia Preprocedure Evaluation (Signed)
Anesthesia Evaluation  Patient identified by MRN, date of birth, ID band Patient awake    Reviewed: Allergy & Precautions, H&P , NPO status , Patient's Chart, lab work & pertinent test results  History of Anesthesia Complications Negative for: history of anesthetic complications  Airway Mallampati: III  TM Distance: <3 FB Neck ROM: limited    Dental  (+) Poor Dentition, Missing, Lower Dentures, Upper Dentures, Edentulous Lower, Edentulous Upper   Pulmonary neg pulmonary ROS, neg shortness of breath,           Cardiovascular hypertension, (-) angina+ [name]  (-) Past MI negative cardio ROS  + dysrhythmias Atrial Fibrillation      Neuro/Psych  Neuromuscular disease negative neurological ROS  negative psych ROS   GI/Hepatic Neg liver ROS, hiatal hernia, GERD  Medicated and Controlled,  Endo/Other  Hypothyroidism   Renal/GU      Musculoskeletal  (+) Arthritis ,   Abdominal   Peds  Hematology negative hematology ROS (+)   Anesthesia Other Findings Past Medical History: No date: Anemia No date: Arthritis No date: [name] (coronary artery disease) No date: Cancer ([place])     Comment:  thyroid and mouth cancer 2013, squamous cell of tongue No date: Diverticulosis No date: Eczema No date: GERD (gastroesophageal reflux disease) No date: History of kidney stones No date: Hypertension No date: Osteoarthritis No date: Rheumatoid arthritis ( [place])  Past Surgical History: No date: CHOLECYSTECTOMY 06/28/2014: ERCP; N/A     Comment:  Procedure: ENDOSCOPIC RETROGRADE               CHOLANGIOPANCREATOGRAPHY (ERCP);  Surgeon: Arta Silence, MD;  Location: [facility] ENDOSCOPY;  Service: Endoscopy;              Laterality: N/A; 08/19/2014: ESOPHAGOGASTRODUODENOSCOPY (EGD) WITH PROPOFOL; N/A     Comment:  Procedure: ESOPHAGOGASTRODUODENOSCOPY (EGD) WITH               PROPOFOL;  Surgeon: Arta Silence, MD;  Location: [facility]        ENDOSCOPY;  Service: Endoscopy;  Laterality: N/A; 08/19/2014: GASTROINTESTINAL STENT REMOVAL; N/A     Comment:  Procedure: GASTROINTESTINAL STENT REMOVAL;  Surgeon:               Arta Silence, MD;  Location: [facility] ENDOSCOPY;  Service:               Endoscopy;  Laterality: N/A; No date: laser varicose vein surgery No date: TOTAL THYROIDECTOMY 09/17/1998: uterine polyp removal  BMI    Body Mass Index:  17.39 kg/m      Reproductive/Obstetrics negative OB ROS                             Anesthesia Physical Anesthesia Plan  ASA: III  Anesthesia Plan: Spinal   Post-op Pain Management:    Induction:   PONV Risk Score and Plan:   Airway Management Planned: Natural Airway and Nasal Cannula  Additional Equipment:   Intra-op Plan:   Post-operative Plan:   Informed Consent: I have reviewed the patients History and Physical, chart, labs and discussed the procedure including the risks, benefits and alternatives for the proposed anesthesia with the patient or authorized representative who has indicated his/her understanding and acceptance.   Dental Advisory Given  Plan Discussed with: Anesthesiologist, CRNA and Surgeon  Anesthesia Plan Comments: (Patient reports no bleeding problems and no anticoagulant  use.  Plan for spinal with backup GA  Patient consented for risks of anesthesia including but not limited to:  - adverse reactions to medications - risk of bleeding, infection, nerve damage and headache - risk of failed spinal - damage to teeth, lips or other oral mucosa - sore throat or hoarseness - Damage to heart, brain, lungs or loss of life  Patient voiced understanding.)        Anesthesia Quick Evaluation

## 2017-06-20 NOTE — Progress Notes (Addendum)
Patient is being admitted to room 149 from OR. A&O x4, but sleepy. Family at bedside. Foley, hemovac, polar care, and dressing in place.  Bone foam placed. IV fludis started. Reviewed POC and orders. Bed alarm on for safety.

## 2017-06-20 NOTE — Op Note (Signed)
OPERATIVE NOTE  DATE OF SURGERY:  06/20/2017  PATIENT NAME:  Amy Hess   DOB: November 29, 1928  MRN: [id number]  PRE-OPERATIVE DIAGNOSIS: Degenerative arthrosis of the right knee, primary  POST-OPERATIVE DIAGNOSIS:  Same  PROCEDURE:  Right total knee arthroplasty using computer-assisted navigation  SURGEON:  Marciano Sequin. M.D.  ANESTHESIA: spinal  ESTIMATED BLOOD LOSS: 50 mL  FLUIDS REPLACED: 1100 mL of crystalloid  TOURNIQUET TIME: 100 minutes  DRAINS: 2 medium Hemovac drains  SOFT TISSUE RELEASES: Anterior cruciate ligament, posterior cruciate ligament, deep medial collateral ligament, patellofemoral ligament, and posterolateral corner  IMPLANTS UTILIZED: DePuy Attune size 5 posterior stabilized femoral component (cemented), size 4 rotating platform tibial component (cemented), 35 mm medialized dome patella (cemented), and a 5 mm stabilized rotating platform polyethylene insert.  INDICATIONS FOR SURGERY: Amy Hess is a 84 y.o. year old female with a long history of progressive knee pain. X-rays demonstrated severe degenerative changes in tricompartmental fashion. The patient had not seen any significant improvement despite conservative nonsurgical intervention. After discussion of the risks and benefits of surgical intervention, the patient expressed understanding of the risks benefits and agree with plans for total knee arthroplasty.   The risks, benefits, and alternatives were discussed at length including but not limited to the risks of infection, bleeding, nerve injury, stiffness, blood clots, the need for revision surgery, cardiopulmonary complications, among others, and they were willing to proceed.  PROCEDURE IN DETAIL: The patient was brought into the operating room and, after adequate spinal anesthesia was achieved, a tourniquet was placed on the patient's upper thigh. The patient's knee and leg were cleaned and prepped with alcohol and DuraPrep and  draped in the usual sterile fashion. A "timeout" was performed as per usual protocol. The lower extremity was exsanguinated using an Esmarch, and the tourniquet was inflated to 300 mmHg. An anterior longitudinal incision was made followed by a standard mid vastus approach. The deep fibers of the medial collateral ligament were elevated in a subperiosteal fashion off of the medial flare of the tibia so as to maintain a continuous soft tissue sleeve. The patella was subluxed laterally and the patellofemoral ligament was incised. Inspection of the knee demonstrated severe degenerative changes with full-thickness loss of articular cartilage. Osteophytes were debrided using a rongeur. Anterior and posterior cruciate ligaments were excised. Two 4.0 mm Schanz pins were inserted in the femur and into the tibia for attachment of the array of trackers used for computer-assisted navigation. Hip center was identified using a circumduction technique. Distal landmarks were mapped using the computer. The distal femur and proximal tibia were mapped using the computer. The distal femoral cutting guide was positioned using computer-assisted navigation so as to achieve a 5 distal valgus cut. The femur was sized and it was felt that a size 5 femoral component was appropriate. A size 5 femoral cutting guide was positioned and the anterior cut was performed and verified using the computer. This was followed by completion of the posterior and chamfer cuts. Femoral cutting guide for the central box was then positioned in the center box cut was performed.  Attention was then directed to the proximal tibia. Medial and lateral menisci were excised. The extramedullary tibial cutting guide was positioned using computer-assisted navigation so as to achieve a 0 varus-valgus alignment and 3 posterior slope. The cut was performed and verified using the computer. The proximal tibia was sized and it was felt that a size 4 tibial tray was  appropriate. Tibial and femoral trials  were inserted followed by insertion of a 5 mm polyethylene insert.  The knee was felt to be tight laterally.  The trial components were removed and the knee was brought into full extension and distracted using the Moreland retractors.  The posterolateral corner was carefully released using combination of electrocautery and Metzenbaum scissors.  Trial impar implants were reinserted using the 5 mm polyethylene trial.  This allowed for excellent mediolateral soft tissue balancing both in flexion and in full extension. Finally, the patella was cut and prepared so as to accommodate a 35 mm medialized dome patella. A patella trial was placed and the knee was placed through a range of motion with excellent patellar tracking appreciated. The femoral trial was removed after debridement of posterior osteophytes. The central post-hole for the tibial component was reamed followed by insertion of a keel punch. Tibial trials were then removed. Cut surfaces of bone were irrigated with copious amounts of normal saline with antibiotic solution using pulsatile lavage and then suctioned dry. Polymethylmethacrylate cement was prepared in the usual fashion using a vacuum mixer. Cement was applied to the cut surface of the proximal tibia as well as along the undersurface of a size 4 rotating platform tibial component. Tibial component was positioned and impacted into place. Excess cement was removed using [employer] fast streamer. Cement was then applied to the cut surfaces of the femur as well as along the posterior flanges of the size 5 femoral component. The femoral component was positioned and impacted into place. Excess cement was removed using [employer] fast streamer. A 5 mm polyethylene trial was inserted and the knee was brought into full extension with steady axial compression applied. Finally, cement was applied to the backside of a 35 mm medialized dome patella and the patellar component was positioned  and patellar clamp applied. Excess cement was removed using [employer] fast streamer. After adequate curing of the cement, the tourniquet was deflated after a total tourniquet time of 100 minutes. Hemostasis was achieved using electrocautery. The knee was irrigated with copious amounts of normal saline with antibiotic solution using pulsatile lavage and then suctioned dry. 20 mL of 1.3% Exparel and 60 mL of 0.25% Marcaine in 40 mL of normal saline was injected along the posterior capsule, medial and lateral gutters, and along the arthrotomy site. A 5 mm stabilized rotating platform polyethylene insert was inserted and the knee was placed through a range of motion with excellent mediolateral soft tissue balancing appreciated and excellent patellar tracking noted. 2 medium drains were placed in the wound bed and brought out through separate stab incisions. The medial parapatellar portion of the incision was reapproximated using interrupted sutures of #1 Vicryl. Subcutaneous tissue was approximated in layers using first #0 Vicryl followed #2-0 Vicryl. The skin was approximated with skin staples. A sterile dressing was applied.  The patient tolerated the procedure well and was transported to the recovery room in stable condition.    James P. Holley Bouche., M.D.

## 2017-06-20 NOTE — Transfer of Care (Signed)
Immediate Anesthesia Transfer of Care Note  Patient: Amy Hess  Procedure(s) Performed: COMPUTER ASSISTED TOTAL KNEE ARTHROPLASTY (Right )  Patient Location: PACU  Anesthesia Type:Spinal  Level of Consciousness: awake and patient cooperative  Airway & Oxygen Therapy: Patient Spontanous Breathing and Patient connected to nasal cannula oxygen  Post-op Assessment: Report given to RN and Post -op Vital signs reviewed and stable  Post vital signs: Reviewed and stable  Last Vitals:  Vitals Value Taken Time  BP 116/88 06/20/2017 10:58 AM  Temp    Pulse 101 06/20/2017 10:57 AM  Resp 26 06/20/2017 10:57 AM  SpO2 99 % 06/20/2017 10:57 AM  Vitals shown include unvalidated device data.  Last Pain:  Vitals:   06/20/17 0631  TempSrc: Oral  PainSc: 0-No pain         Complications: No apparent anesthesia complications

## 2017-06-20 NOTE — Evaluation (Signed)
Physical Therapy Evaluation Patient Details Name: Amy Hess MRN: [id number] DOB: [birth year] Today's Date: 06/20/2017   History of Present Illness  Pt admitted for R TKR.   Clinical Impression  Pt is a pleasant 84 year old female who was admitted for R TKR. Pt performs bed mobility, transfers, and ambulation with cga and RW. Pt demonstrates ability to perform 10 SLRs with independence, therefore does not require KI for mobility. Pt demonstrates deficits with strength/mobility/ROM. Educated on [place] status prior to mobility efforts. Appears motivated to participate in therapy. Would benefit from skilled PT to address above deficits and promote optimal return to PLOF. Recommend transition to [place] upon discharge from acute hospitalization.       Follow Up Recommendations Home health PT    Equipment Recommendations  Rolling walker with 5" wheels    Recommendations for Other Services       Precautions / Restrictions Precautions Precautions: Fall;Knee Precaution Booklet Issued: No Restrictions Weight Bearing Restrictions: Yes RLE Weight Bearing: Weight bearing as tolerated      Mobility  Bed Mobility Overal bed mobility: Needs Assistance Bed Mobility: Supine to Sit     Supine to sit: Min guard     General bed mobility comments: safe technique performed with ability to move B LEs off bed and sit at EOB without dizziness or LOB noted   Transfers Overall transfer level: Needs assistance Equipment used: Rolling walker (2 wheeled) Transfers: Sit to/from Stand Sit to Stand: Min guard         General transfer comment: transfers performed with RW and upright posture. Needs cues to use RW correctly  Ambulation/Gait Ambulation/Gait assistance: Min guard Ambulation Distance (Feet): 15 Feet Assistive device: Rolling walker (2 wheeled) Gait Pattern/deviations: Step-to pattern     General Gait Details: ambulated several times in forward/backward direction using RW. Pt  needs mod cues for sequencing of RW. Pt able to safely navigate RW to recliner with safe technique  Stairs            Wheelchair Mobility    Modified Rankin (Stroke Patients Only)       Balance Overall balance assessment: Needs assistance Sitting-balance support: Feet supported Sitting balance-Leahy Scale: Good     Standing balance support: Bilateral upper extremity supported Standing balance-Leahy Scale: Good                               Pertinent Vitals/Pain Pain Assessment: No/denies pain    Home Living Family/patient expects to be discharged to:: Private residence Living Arrangements: Alone Available Help at Discharge: Family;Available 24 hours/day Type of Home: House Home Access: Stairs to enter Entrance Stairs-Rails: Right Entrance Stairs-Number of Steps: 5 Home Layout: One level Home Equipment: Walker - 2 wheels      Prior Function Level of Independence: Independent         Comments: was previously independent with all mobility     Hand Dominance        Extremity/Trunk Assessment   Upper Extremity Assessment Upper Extremity Assessment: Overall WFL for tasks assessed    Lower Extremity Assessment Lower Extremity Assessment: Generalized weakness(R LE grossly 3/5; L LE grossly 4/5)       Communication   Communication: No difficulties  Cognition Arousal/Alertness: Awake/alert Behavior During Therapy: WFL for tasks assessed/performed Overall Cognitive Status: Within Functional Limits for tasks assessed  General Comments      Exercises Total Joint Exercises Goniometric ROM: R Knee AAROM 0-90 degrees Other Exercises Other Exercises: Supine ther-ex performed on R LE including ankle pumps, quad sets, SLR, hip abd/add, and knee flexion stretches x 10 reps with cga. Safe technique performed   Assessment/Plan    PT Assessment Patient needs continued PT services  PT  Problem List Decreased strength;Decreased range of motion;Decreased balance;Decreased mobility;Decreased knowledge of use of DME;Pain       PT Treatment Interventions DME instruction;Gait training;Stair training;Therapeutic exercise    PT Goals (Current goals can be found in the Care Plan section)  Acute Rehab PT Goals Patient Stated Goal: to get stronger PT Goal Formulation: With patient Time For Goal Achievement: 07/04/17 Potential to Achieve Goals: Good    Frequency BID   Barriers to discharge        Co-evaluation               AM-PAC PT "6 Clicks" Daily Activity  Outcome Measure Difficulty turning over in bed (including adjusting bedclothes, sheets and blankets)?: Unable Difficulty moving from lying on back to sitting on the side of the bed? : Unable Difficulty sitting down on and standing up from a chair with arms (e.g., wheelchair, bedside commode, etc,.)?: Unable Help needed moving to and from a bed to chair (including a wheelchair)?: A Little Help needed walking in hospital room?: A Little Help needed climbing 3-5 steps with a railing? : A Lot 6 Click Score: 11    End of Session Equipment Utilized During Treatment: Gait belt Activity Tolerance: Patient tolerated treatment well Patient left: in chair;with chair alarm set;with SCD's reapplied Nurse Communication: Mobility status PT Visit Diagnosis: Muscle weakness (generalized) (M62.81);Difficulty in walking, not elsewhere classified (R26.2);Pain Pain - Right/Left: Right Pain - part of body: Knee    Time: 1449-1530 PT Time Calculation (min) (ACUTE ONLY): 41 min   Charges:   PT Evaluation $PT Eval Low Complexity: 1 Low PT Treatments $Therapeutic Exercise: 23-37 mins   PT G Codes:        [name], PT, DPT [phone]   Adriann Thau 06/20/2017, 5:16 PM

## 2017-06-20 NOTE — Anesthesia Post-op Follow-up Note (Signed)
Anesthesia QCDR form completed.        

## 2017-06-20 NOTE — OR Nursing (Signed)
Patient is resting well no c/o dicomfort

## 2017-06-20 NOTE — NC FL2 (Signed)
[place] LEVEL OF CARE SCREENING TOOL     IDENTIFICATION  Patient Name: TATIJANA BIERLY Birthdate: [birth year] Sex: female Admission Date (Current Location): 06/20/2017  [place] and [place] Number:  Engineering geologist and Address:  [facility], [address] [place], [place] [address]      Provider Number: [id number]  Attending Physician Name and Address:  Dereck Leep, MD  Relative Name and Phone Number:       Current Level of Care: Hospital Recommended Level of Care: [place] Prior Approval Number:    Date Approved/Denied:   PASRR Number: [id number])  Discharge Plan: SNF    Current Diagnoses: Patient Active Problem List   Diagnosis Date Noted  . Anemia, unspecified 06/20/2017  . Eczema 06/20/2017  . Neutropenia ([place]) 06/20/2017  . S/P total knee arthroplasty 06/20/2017  . Chronic atrial fibrillation ([place]) 06/08/2017  . Tongue cancer ([place]) 06/29/2016  . Thyroid cancer ([place]) 06/29/2016  . Primary osteoarthritis of right knee 11/18/2015  . Choledocholithiasis 06/28/2014  . Choledocholithiasis with obstruction 06/27/2014  . Rheumatoid arthritis ([place]) 06/27/2014  . HTN (hypertension), benign 06/27/2014  . Hypothyroidism 06/27/2014  . Benign meningioma of brain ([place]) 12/12/2013  . Hiatal hernia 07/10/2013  . Squamous cell carcinoma 07/10/2013  . Hyperlipidemia 06/15/2013  . Trigeminal neuralgia 06/15/2013    Orientation RESPIRATION BLADDER Height & Weight     Self, Time, Situation, Place  Normal Continent Weight: 101 lb 5 oz (46 kg) Height:     BEHAVIORAL SYMPTOMS/MOOD NEUROLOGICAL BOWEL NUTRITION STATUS      Continent Diet(Diet: NPO for surgery to be advanced. )  AMBULATORY STATUS COMMUNICATION OF NEEDS Skin   Extensive Assist Verbally Surgical wounds(Incision: Right Knee )                       Personal Care Assistance Level of Assistance  Bathing, Feeding, Dressing Bathing  Assistance: Limited assistance Feeding assistance: Independent Dressing Assistance: Limited assistance     Functional Limitations Info  Sight, Hearing, Speech Sight Info: Adequate Hearing Info: Adequate Speech Info: Adequate    SPECIAL CARE FACTORS FREQUENCY  PT (By licensed PT), OT (By licensed OT)     PT Frequency: (5) OT Frequency: (5)            Contractures      Additional Factors Info  Code Status, Allergies Code Status Info: (Full Code. ) Allergies Info: (Dyazide Hydrochlorothiazide W-triamterene, Plaquenil Hydroxychloroquine Sulfate, Sulfate, Codeine)           Current Medications (06/20/2017):  This is the current hospital active medication list Current Facility-Administered Medications  Medication Dose Route Frequency Provider Last Rate Last Dose  . carvedilol (COREG) tablet 3.125 mg  3.125 mg Oral BID WC Hooten, Laurice Record, MD   3.125 mg at 06/20/17 0706  . chlorhexidine (HIBICLENS) 4 % liquid 4 application  60 mL Topical Once Hooten, Laurice Record, MD      . fentaNYL (SUBLIMAZE) injection 25-50 mcg  25-50 mcg Intravenous Q5 min PRN Piscitello, Precious Haws, MD      . lactated ringers infusion   Intravenous Continuous Piscitello, Precious Haws, MD 0 mL/hr at 06/20/17 1029    . oxyCODONE (Oxy IR/ROXICODONE) immediate release tablet 5 mg  5 mg Oral Once PRN Piscitello, Precious Haws, MD       Or  . oxyCODONE (ROXICODONE) 5 MG/5ML solution 5 mg  5 mg Oral Once PRN Piscitello, Precious Haws, MD      .  tranexamic acid (CYKLOKAPRON) 1,000 mg in sodium chloride 0.9 % 100 mL IVPB  1,000 mg Intravenous Once Hooten, Laurice Record, MD         Discharge Medications: Please see discharge summary for a list of discharge medications.  Relevant Imaging Results:  Relevant Lab Results:   Additional Information (SSN: [id number])  Jaidence Geisler, Veronia Beets, LCSW

## 2017-06-20 NOTE — Anesthesia Procedure Notes (Signed)
Spinal  Patient location during procedure: OR End time: 06/20/2017 7:27 AM Staffing Resident/CRNA: Bernardo Heater, CRNA Performed: resident/CRNA  Preanesthetic Checklist Completed: patient identified, site marked, surgical consent, pre-op evaluation, timeout performed, IV checked, risks and benefits discussed and monitors and equipment checked Spinal Block Patient position: sitting Prep: ChloraPrep Patient monitoring: heart rate, continuous pulse ox, blood pressure and cardiac monitor Approach: midline Location: L4-5 Injection technique: single-shot Needle Needle type: Introducer and Pencan  Needle gauge: 24 G Needle length: 9 cm Additional Notes Negative paresthesia. Negative blood return. Positive free-flowing CSF. Expiration date of kit checked and confirmed. Patient tolerated procedure well, without complications.

## 2017-06-20 NOTE — H&P (Signed)
The patient has been re-examined, and the chart reviewed, and there have been no interval changes to the documented history and physical.    The risks, benefits, and alternatives have been discussed at length. The patient expressed understanding of the risks benefits and agreed with plans for surgical intervention.  Amy Hess, Jr. M.D.    

## 2017-06-20 NOTE — Discharge Instructions (Signed)
°  Instructions after Total Knee Replacement ° ° Derhonda Eastlick P. Canaan Prue, Jr., M.D.    ° Dept. of Orthopaedics & Sports Medicine ° [facility] ° [address] ° , [place]  [address] ° Phone: [phone]   Fax: [phone] ° °  °DIET: °• Drink plenty of non-alcoholic fluids. °• Resume your normal diet. Include foods high in fiber. ° °ACTIVITY:  °• You may use crutches or a walker with weight-bearing as tolerated, unless instructed otherwise. °• You may be weaned off of the walker or crutches by your Physical Therapist.  °• Do NOT place pillows under the knee. Anything placed under the knee could limit your ability to straighten the knee.   °• Continue doing gentle exercises. Exercising will reduce the pain and swelling, increase motion, and prevent muscle weakness.   °• Please continue to use the TED compression stockings for 6 weeks. You may remove the stockings at night, but should reapply them in the morning. °• Do not drive or operate any equipment until instructed. ° °WOUND CARE:  °• Continue to use the PolarCare or ice packs periodically to reduce pain and swelling. °• You may bathe or shower after the staples are removed at the first office visit following surgery. ° °MEDICATIONS: °• You may resume your regular medications. °• Please take the pain medication as prescribed on the medication. °• Do not take pain medication on an empty stomach. °• You have been given a prescription for a blood thinner (Lovenox or Coumadin). Please take the medication as instructed. (NOTE: After completing a 2 week course of Lovenox, take one Enteric-coated aspirin once a day. This along with elevation will help reduce the possibility of phlebitis in your operated leg.) °• Do not drive or drink alcoholic beverages when taking pain medications. ° °CALL THE OFFICE FOR: °• Temperature above 101 degrees °• Excessive bleeding or drainage on the dressing. °• Excessive swelling, coldness, or paleness of the toes. °• Persistent  nausea and vomiting. ° °FOLLOW-UP:  °• You should have an appointment to return to the office in 10-14 days after surgery. °• Arrangements have been made for continuation of Physical Therapy (either home therapy or outpatient therapy). °  °

## 2017-06-21 LAB — CBC
HEMATOCRIT: 36.2 % (ref 35.0–47.0)
Hemoglobin: 12.2 g/dL (ref 12.0–16.0)
MCH: 31.7 pg (ref 26.0–34.0)
MCHC: 33.8 g/dL (ref 32.0–36.0)
MCV: 93.6 fL (ref 80.0–100.0)
Platelets: 171 10*3/uL (ref 150–440)
RBC: 3.87 MIL/uL (ref 3.80–5.20)
RDW: 13.6 % (ref 11.5–14.5)
WBC: 9.9 10*3/uL (ref 3.6–11.0)

## 2017-06-21 LAB — BASIC METABOLIC PANEL
ANION GAP: 9 (ref 5–15)
BUN: 16 mg/dL (ref 6–20)
CO2: 27 mmol/L (ref 22–32)
Calcium: 7.8 mg/dL — ABNORMAL LOW (ref 8.9–10.3)
Chloride: 102 mmol/L (ref 101–111)
Creatinine, Ser: 1.09 mg/dL — ABNORMAL HIGH (ref 0.44–1.00)
GFR calc Af Amer: 52 mL/min — ABNORMAL LOW (ref >60)
GFR calc non Af Amer: 45 mL/min — ABNORMAL LOW (ref >60)
GLUCOSE: 108 mg/dL — AB (ref 65–99)
POTASSIUM: 3.1 mmol/L — AB (ref 3.5–5.1)
Sodium: 138 mmol/L (ref 135–145)

## 2017-06-21 MED ORDER — TRAMADOL HCL 50 MG PO TABS: 50.0000 mg | ORAL_TABLET | Freq: Four times a day (QID) | ORAL | 0 refills | Status: DC | PRN

## 2017-06-21 MED ORDER — CELECOXIB 200 MG PO CAPS: 200.0000 mg | ORAL_CAPSULE | Freq: Two times a day (BID) | ORAL | 0 refills | Status: DC

## 2017-06-21 NOTE — Progress Notes (Signed)
Clinical Education officer, museum (CSW) received SNF consult. PT is recommending home health. RN case manager aware of above. Please reconult if future social work needs arise. CSW signing off.   [employer], LCSW [phone]

## 2017-06-21 NOTE — Anesthesia Postprocedure Evaluation (Signed)
Anesthesia Post Note  Patient: Amy Hess  Procedure(s) Performed: COMPUTER ASSISTED TOTAL KNEE ARTHROPLASTY (Right )  Patient location during evaluation: Nursing Unit Anesthesia Type: Spinal Level of consciousness: awake and awake and alert Pain management: pain level controlled Vital Signs Assessment: post-procedure vital signs reviewed and stable Respiratory status: spontaneous breathing Cardiovascular status: blood pressure returned to baseline Postop Assessment: no headache, no backache and adequate PO intake Anesthetic complications: no     Last Vitals:  Vitals:   06/20/17 2330 06/21/17 0723  BP: 138/85 (!) 147/97  Pulse: 80 74  Resp: 18   Temp:  36.6 C  SpO2: 100% 100%    Last Pain:  Vitals:   06/21/17 0723  TempSrc: Oral  PainSc:                  [name]

## 2017-06-21 NOTE — Progress Notes (Signed)
Physical Therapy Treatment Patient Details Name: Amy Hess MRN: [id number] DOB: 1928/05/01 Today's Date: 06/21/2017    History of Present Illness Pt admitted for R TKR.     PT Comments    Pt is making good progress towards goals with increased ambulation distance this session using RW. Good tolerance with there-ex with increased reps noted. Educated on written HEP. Appears motivated to perform therapy with improved AAROM this date. Will continue to progress.    Follow Up Recommendations  Home health PT     Equipment Recommendations  Rolling walker with 5" wheels    Recommendations for Other Services       Precautions / Restrictions Precautions Precautions: Fall;Knee Precaution Booklet Issued: Yes (comment) Restrictions Weight Bearing Restrictions: Yes RLE Weight Bearing: Weight bearing as tolerated    Mobility  Bed Mobility Overal bed mobility: Needs Assistance Bed Mobility: Supine to Sit     Supine to sit: Supervision     General bed mobility comments: safe technique with pt only requiring cues for sequencing. Once seated at EOB, able to sit with upright posture, little dizziness, however improved with time  Transfers Overall transfer level: Needs assistance Equipment used: Rolling walker (2 wheeled) Transfers: Sit to/from Stand Sit to Stand: Min guard         General transfer comment: transfers performed with RW and cga. Needs cues to push from seated surface  Ambulation/Gait Ambulation/Gait assistance: Min guard Ambulation Distance (Feet): 100 Feet Assistive device: Rolling walker (2 wheeled) Gait Pattern/deviations: Step-through pattern     General Gait Details: ambulated in hallway demonstrating reciprocal gait pattern. Needs cues for safety and obstacle avoidance while in room. Cues for sequencing with RW. Does not appear to be limited by pain   Stairs             Wheelchair Mobility    Modified Rankin (Stroke Patients Only)        Balance                                            Cognition Arousal/Alertness: Awake/alert Behavior During Therapy: WFL for tasks assessed/performed Overall Cognitive Status: Within Functional Limits for tasks assessed                                        Exercises Total Joint Exercises Goniometric ROM: R knee AAROM: 0-95 degrees Other Exercises Other Exercises: Supine ther-ex performed on R LE including ankle pumps, quad sets, SLR, hip abd/add, SAQ, and knee flexion stretches x 12 reps with cga. Safe technique performed    General Comments        Pertinent Vitals/Pain Pain Assessment: 0-10 Pain Score: 7  Pain Location: R knee Pain Descriptors / Indicators: Operative site guarding Pain Intervention(s): Limited activity within patient's tolerance;Ice applied;Premedicated before session    Home Living                      Prior Function            PT Goals (current goals can now be found in the care plan section) Acute Rehab PT Goals Patient Stated Goal: to get stronger PT Goal Formulation: With patient Time For Goal Achievement: 07/04/17 Potential to Achieve Goals: Good Progress towards PT goals: Progressing toward goals  Frequency    BID      PT Plan Current plan remains appropriate    Co-evaluation              AM-PAC PT "6 Clicks" Daily Activity  Outcome Measure  Difficulty turning over in bed (including adjusting bedclothes, sheets and blankets)?: None Difficulty moving from lying on back to sitting on the side of the bed? : A Little Difficulty sitting down on and standing up from a chair with arms (e.g., wheelchair, bedside commode, etc,.)?: Unable Help needed moving to and from a bed to chair (including a wheelchair)?: A Little Help needed walking in hospital room?: A Little Help needed climbing 3-5 steps with a railing? : A Little 6 Click Score: 17    End of Session Equipment Utilized  During Treatment: Gait belt Activity Tolerance: Patient tolerated treatment well Patient left: in chair;with chair alarm set;with SCD's reapplied Nurse Communication: Mobility status PT Visit Diagnosis: Muscle weakness (generalized) (M62.81);Difficulty in walking, not elsewhere classified (R26.2);Pain Pain - Right/Left: Right Pain - part of body: Knee     Time: 4970-2637 PT Time Calculation (min) (ACUTE ONLY): 33 min  Charges:  $Gait Training: 8-22 mins $Therapeutic Exercise: 8-22 mins                    G Codes:       [name], PT, DPT [phone]    [name] 06/21/2017, 10:56 AM

## 2017-06-21 NOTE — Care Management Note (Signed)
Case Management Note  Patient Details  Name: Amy Hess MRN: [id number] Date of Birth: 07/17/1928  Subjective/Objective:   Met with patient and her two daughters at bedside to discuss discharge planning. Patient lives alone but her daughter, Amy Hess, lives next to her. She is able to assist her as needed. Patient has a walker and bsc. Offered a list of home care providers. Referral to [facility] for HHPT. She is on Xarelto prior to surgery to will not need Lovenox.                 Action/Plan: [facility] for HHPT, No DME, Xarelto.   Expected Discharge Date:  06/22/17               Expected Discharge Plan:  [place]  In-House Referral:     Discharge planning Services  CM Consult  Post Acute Care Choice:  Home Health Choice offered to:  Adult Children, Patient  DME Arranged:    DME Agency:     HH Arranged:  PT HH Agency:  [facility] (formerly [employer])  Status of Service:  In process, will continue to follow  If discussed at Long Length of Stay Meetings, dates discussed:    Additional Comments:  Jolly Mango, RN 06/21/2017, 9:57 AM

## 2017-06-21 NOTE — Evaluation (Signed)
Occupational Therapy Evaluation Patient Details Name: Amy Hess MRN: [id number] DOB: [birth year] Today's Date: 06/21/2017    History of Present Illness Pt admitted for R TKR.    Clinical Impression   Pt is 84 year old female s/p R TKR.  Pt was living along and independent in all ADLs prior to surgery and is eager to return to PLOF. She has 2 daughters who live close by and were present for evaluation.  Pt currently requires min guard assist for LB dressing while in seated position and has low pain level of 2/10 and reports that knee is only sore.  Pt would benefit from instruction in dressing techniques with or without assistive devices for dressing and bathing skills.  Pt has grab bars in her walk in shower with a built in seat and rails around her toilet to help with mobility and safety on and off toilet.  She stated that her husband was disabled for 28 years so her home has been modified. Pt was able to complete LB dressing without any AD and had good balance sitting EOB.  Rec someone from her family be with her 24 hours for supervision for first 2 weeks before going to outpatient at [facility].      Follow Up Recommendations  No OT follow up    Equipment Recommendations       Recommendations for Other Services       Precautions / Restrictions Precautions Precautions: Fall;Knee Precaution Booklet Issued: Yes (comment) Restrictions Weight Bearing Restrictions: Yes RLE Weight Bearing: Weight bearing as tolerated      Mobility Bed Mobility Overal bed mobility: Needs Assistance Bed Mobility: Supine to Sit     Supine to sit: Supervision     General bed mobility comments: safe technique with pt only requiring cues for sequencing. Once seated at EOB, able to sit with upright posture, little dizziness, however improved with time  Transfers Overall transfer level: Needs assistance Equipment used: Rolling walker (2 wheeled) Transfers: Sit to/from Stand Sit to Stand: Min  guard         General transfer comment: transfers performed with RW and cga. Needs cues to push from seated surface    Balance                                           ADL either performed or assessed with clinical judgement   ADL Overall ADL's : Needs assistance/impaired Eating/Feeding: Independent;Set up   Grooming: Wash/dry hands;Wash/dry face;Oral care;Applying deodorant;Brushing hair;Independent;Set up           Upper Body Dressing : Independent;Set up   Lower Body Dressing: Min guard;Set up Lower Body Dressing Details (indicate cue type and reason): Pt able to complete donning and doffing socks and reach both feet sitting EOB with good balance and minimal pain.  Pt wil  not need any AD for LB dressing.                     Vision Patient Visual Report: No change from baseline       Perception     Praxis      Pertinent Vitals/Pain Pain Assessment: 0-10 Pain Score: 2  Pain Location: R knee Pain Descriptors / Indicators: Sore Pain Intervention(s): Monitored during session;Premedicated before session     Hand Dominance Right   Extremity/Trunk Assessment Upper Extremity Assessment Upper Extremity Assessment: Overall  WFL for tasks assessed   Lower Extremity Assessment Lower Extremity Assessment: Defer to PT evaluation       Communication Communication Communication: No difficulties   Cognition Arousal/Alertness: Awake/alert Behavior During Therapy: WFL for tasks assessed/performed Overall Cognitive Status: Within Functional Limits for tasks assessed                                     General Comments       Exercises Total Joint Exercises Goniometric ROM: R knee AAROM: 0-95 degrees Other Exercises Other Exercises: Supine ther-ex performed on R LE including ankle pumps, quad sets, SLR, hip abd/add, SAQ, and knee flexion stretches x 12 reps with cga. Safe technique performed   Shoulder Instructions       Home Living Family/patient expects to be discharged to:: Private residence Living Arrangements: Alone Available Help at Discharge: Family;Available 24 hours/day Type of Home: House Home Access: Stairs to enter [employer] of Steps: 5 Entrance Stairs-Rails: Right Home Layout: One level     Bathroom Shower/Tub: Occupational psychologist: Handicapped height(grab bars as well on both sides of toilet) Bathroom Accessibility: Yes How Accessible: Accessible via walker Home Equipment: [place] - 2 wheels;Shower seat - built in;Grab bars - toilet;Grab bars - tub/shower;Adaptive equipment Adaptive Equipment: [place]acher        Prior Functioning/Environment Level of Independence: Independent        Comments: was previously independent with all mobility        OT Problem List: Pain;Decreased strength;Decreased range of motion;Decreased activity tolerance      OT Treatment/Interventions: Self-care/ADL training;DME and/or AE instruction;Patient/family education    OT Goals(Current goals can be found in the care plan section) Acute Rehab OT Goals Patient Stated Goal: to be more independent again OT Goal Formulation: With patient/family Time For Goal Achievement: 07/05/17 Potential to Achieve Goals: Good ADL Goals Pt Will Perform Lower Body Dressing: with supervision;with set-up;sit to/from stand Pt Will Transfer to Toilet: with set-up;with supervision;grab bars;regular height toilet;stand pivot transfer  OT Frequency: Min 1X/week   Barriers to D/C:            Co-evaluation              AM-PAC PT "6 Clicks" Daily Activity     Outcome Measure Help from another person eating meals?: None Help from another person taking care of personal grooming?: None Help from another person toileting, which includes using toliet, bedpan, or urinal?: A Little Help from another person bathing (including washing, rinsing, drying)?: A Little Help from another person to put  on and taking off regular upper body clothing?: None Help from another person to put on and taking off regular lower body clothing?: A Little 6 Click Score: 21   End of Session    Activity Tolerance: Patient tolerated treatment well Patient left: in bed;with bed alarm set;with family/visitor present;with call bell/phone within reach  OT Visit Diagnosis: Muscle weakness (generalized) (M62.81);Pain Pain - Right/Left: Right Pain - part of body: Knee                Time: 1350-1420 OT Time Calculation (min): 30 min Charges:  OT General Charges $OT Visit: 1 Visit OT Evaluation $OT Eval Low Complexity: 1 Low OT Treatments $Self Care/Home Management : 8-22 mins G-Codes:     Chrys Racer, OTR/L ascom [phone] 06/21/17, 2:33 PM

## 2017-06-21 NOTE — Progress Notes (Signed)
ORTHOPAEDICS PROGRESS NOTE  PATIENT NAME: Amy Hess DOB: 10-Nov-1928  MRN: [id number]  POD # 1: Right total knee arthroplasty  Subjective: The patient was apparently somewhat restless and confused last night.  Her daughter is at bedside this morning and the patient is doing better. The patient tolerated physical therapy well yesterday after surgery. The patient denies any significant pain at this time.  No nausea or vomiting.  Objective: Vital signs in last 24 hours: Temp:  [97.6 F (36.4 C)-98.5 F (36.9 C)] 97.6 F (36.4 C) (05/29 1542) Pulse Rate:  [40-135] 80 (05/29 2330) Resp:  [15-26] 18 (05/29 2330) BP: (116-157)/(85-113) 138/85 (05/29 2330) SpO2:  [98 %-100 %] 100 % (05/29 2330) Weight:  [45.6 kg (100 lb 8 oz)] 45.6 kg (100 lb 8 oz) (05/29 1245)  Intake/Output from previous day: 05/29 0701 - 05/30 0700 In: 3308.3 [P.O.:240; I.V.:2968.3; IV Piggyback:100] Out: 3419 [Urine:1925; Drains:210; Blood:50]  No results for input(s): WBC, HGB, HCT, PLT, K, CL, CO2, BUN, CREATININE, GLUCOSE, CALCIUM, LABPT, INR in the last 72 hours.  EXAM General: Well-developed well-nourished female seen in no apparent discomfort. Lungs: clear to auscultation Cardiac: Irregular rate and rhythm.  No gross murmurs, gallops, rubs. Right lower extremity: Hemovac drain and Polar Care are in place and functional.  Bone foam is in place.  The patient is able to perform an independent straight leg raise with minimal assist.  Dressing is dry and intact.  [name] test is negative. Neurologic: Awake, alert, and oriented.  Sensory and motor function are intact.  Assessment: Right total knee arthroplasty  Secondary diagnoses: Mild delirium last night, possibly drug related Rheumatoid arthritis Hypertension Gastroesophageal reflux disease Nephrolithiasis Eczema Diverticulosis Coronary artery disease  Plan: I had a lengthy conversation with the patient and her daughter regarding the  confusion last night.  It may be related to the pain medication.  I have instructed nursing to attempt to control pain with Tylenol and nonnarcotic measures if at all possible.  She appears to be doing well this morning. Today's goals were reviewed with the patient.  Continue with physical therapy and Occupational Therapy as per total knee arthroplasty rehab protocol Plan is to go Home after hospital stay. DVT Prophylaxis - Xarelto, Foot Pumps and TED hose  Andersen Mckiver P. Holley Bouche M.D.

## 2017-06-21 NOTE — Progress Notes (Signed)
Pt was  confused throughout the shift. Bone foam in place, but she kept moving in bed and attempting to get up.  Blood pressure was up at one point  Is now more in the normal range. \\ 138/85. Dressing to right knee dry and intact with polar care in place and hemovac. Foley removed this am.

## 2017-06-21 NOTE — Progress Notes (Signed)
Physical Therapy Treatment Patient Details Name: Amy Hess MRN: [id number] DOB: 1928/05/14 Today's Date: 06/21/2017    History of Present Illness Pt admitted for R TKR.     PT Comments    Pt is making good progress towards goals and does not appear to be pain limited despite pain rating at 8/10 after explaining scale to patient. Pt with good endurance with all there-ex and improved fluid gait pattern. Will perform stair training in AM prior to dc. Will continue to progress.   Follow Up Recommendations  Home health PT     Equipment Recommendations  Rolling walker with 5" wheels    Recommendations for Other Services       Precautions / Restrictions Precautions Precautions: Fall;Knee Precaution Booklet Issued: Yes (comment) Restrictions Weight Bearing Restrictions: Yes RLE Weight Bearing: Weight bearing as tolerated    Mobility  Bed Mobility               General bed mobility comments: not performed as pt received in chair and left sitting at EOB with OT present  Transfers Overall transfer level: Needs assistance Equipment used: Rolling walker (2 wheeled) Transfers: Sit to/from Stand Sit to Stand: Min guard         General transfer comment: cues to push from seated position. Upright posture noted using RW  Ambulation/Gait Ambulation/Gait assistance: Min guard Ambulation Distance (Feet): 200 Feet Assistive device: Rolling walker (2 wheeled) Gait Pattern/deviations: Step-through pattern     General Gait Details: ambulated around RN station and in room with reciprocal gait pattern and safe technique. Good gait speed with fluid pattern. Cues to keep RW closer during turns.    Stairs             Wheelchair Mobility    Modified Rankin (Stroke Patients Only)       Balance                                            Cognition Arousal/Alertness: Awake/alert Behavior During Therapy: WFL for tasks  assessed/performed Overall Cognitive Status: Within Functional Limits for tasks assessed                                        Exercises Other Exercises Other Exercises: seated ther-ex performed on R LE including ankle pumps, quad sets, SLR, hip abd/add, SAQ, and LAQ x 12 reps with cga. Safe technique performed    General Comments        Pertinent Vitals/Pain Pain Assessment: 0-10 Pain Score: 8  Pain Location: R knee Pain Descriptors / Indicators: Sore Pain Intervention(s): Limited activity within patient's tolerance;Ice applied    Home Living Family/patient expects to be discharged to:: Private residence Living Arrangements: Alone Available Help at Discharge: Family;Available 24 hours/day Type of Home: House Home Access: Stairs to enter Entrance Stairs-Rails: Right Home Layout: One level Home Equipment: Walker - 2 wheels;Shower seat - built in;Grab bars - toilet;Grab bars - tub/shower;Adaptive equipment      Prior Function Level of Independence: Independent      Comments: was previously independent with all mobility   PT Goals (current goals can now be found in the care plan section) Acute Rehab PT Goals Patient Stated Goal: to be more independent again PT Goal Formulation: With patient Time For Goal  Achievement: 07/04/17 Potential to Achieve Goals: Good Progress towards PT goals: Progressing toward goals    Frequency    BID      PT Plan Current plan remains appropriate    Co-evaluation              AM-PAC PT "6 Clicks" Daily Activity  Outcome Measure  Difficulty turning over in bed (including adjusting bedclothes, sheets and blankets)?: None Difficulty moving from lying on back to sitting on the side of the bed? : A Little Difficulty sitting down on and standing up from a chair with arms (e.g., wheelchair, bedside commode, etc,.)?: Unable Help needed moving to and from a bed to chair (including a wheelchair)?: A Little Help needed  walking in hospital room?: A Little Help needed climbing 3-5 steps with a railing? : A Little 6 Click Score: 17    End of Session Equipment Utilized During Treatment: Gait belt Activity Tolerance: Patient tolerated treatment well Patient left: in bed(seated at EOB) Nurse Communication: Mobility status PT Visit Diagnosis: Muscle weakness (generalized) (M62.81);Difficulty in walking, not elsewhere classified (R26.2);Pain Pain - Right/Left: Right Pain - part of body: Knee     Time: 2446-2863 PT Time Calculation (min) (ACUTE ONLY): 23 min  Charges:  $Gait Training: 8-22 mins $Therapeutic Exercise: 8-22 mins                    G Codes:       Amy Hess, PT, DPT [phone]    Amy Hess 06/21/2017, 3:14 PM

## 2017-06-22 LAB — CBC
HCT: 39.9 % (ref 35.0–47.0)
Hemoglobin: 13.7 g/dL (ref 12.0–16.0)
MCH: 32.2 pg (ref 26.0–34.0)
MCHC: 34.3 g/dL (ref 32.0–36.0)
MCV: 93.9 fL (ref 80.0–100.0)
PLATELETS: 178 10*3/uL (ref 150–440)
RBC: 4.25 MIL/uL (ref 3.80–5.20)
RDW: 14.2 % (ref 11.5–14.5)
WBC: 6.3 10*3/uL (ref 3.6–11.0)

## 2017-06-22 LAB — BASIC METABOLIC PANEL
Anion gap: 8 (ref 5–15)
BUN: 14 mg/dL (ref 6–20)
CALCIUM: 8.5 mg/dL — AB (ref 8.9–10.3)
CO2: 29 mmol/L (ref 22–32)
Chloride: 105 mmol/L (ref 101–111)
Creatinine, Ser: 1.04 mg/dL — ABNORMAL HIGH (ref 0.44–1.00)
GFR calc Af Amer: 55 mL/min — ABNORMAL LOW (ref >60)
GFR, EST NON AFRICAN AMERICAN: 47 mL/min — AB (ref >60)
Glucose, Bld: 109 mg/dL — ABNORMAL HIGH (ref 65–99)
POTASSIUM: 3.3 mmol/L — AB (ref 3.5–5.1)
SODIUM: 142 mmol/L (ref 135–145)

## 2017-06-22 MED ORDER — POTASSIUM CHLORIDE 20 MEQ PO PACK: 20.0000 meq | PACK | Freq: Once | ORAL | Status: DC

## 2017-06-22 NOTE — Care Management (Signed)
[facility]) notified of patient discharge to home.  No other RNCM needs.

## 2017-06-22 NOTE — Care Management Important Message (Signed)
Important Message  Patient Details  Name: Amy Hess MRN: [id number] Date of Birth: [birth year]   Medicare Important Message Given:  Yes    [name] Patman 06/22/2017, 11:11 AM

## 2017-06-22 NOTE — Progress Notes (Signed)
Patient discharged to home this afternoon with HHPT. Daughter bedside to take her home. Belongings packed, IV removed. DC & RX instructions given and patient and daughter acknowledged understanding.

## 2017-06-22 NOTE — Progress Notes (Signed)
Physical Therapy Treatment Patient Details Name: Amy Hess MRN: [id number] DOB: 08-04-1928 Today's Date: 06/22/2017    History of Present Illness Pt admitted for R TKR.     PT Comments    Pt is making good progress towards goals and is ready for dc this date. Stair training performed safely and ambulation with RW. Good endurance with there-ex. Reviewed written HEP with pt and family. Good endurance with ROM. Pt very motivated to participate in therapy. Will dc in house at this time.   Follow Up Recommendations  Home health PT     Equipment Recommendations  Rolling walker with 5" wheels    Recommendations for Other Services       Precautions / Restrictions Precautions Precautions: Fall;Knee Precaution Booklet Issued: Yes (comment) Restrictions Weight Bearing Restrictions: Yes RLE Weight Bearing: Weight bearing as tolerated    Mobility  Bed Mobility Overal bed mobility: Modified Independent Bed Mobility: Supine to Sit     Supine to sit: Modified independent (Device/Increase time)     General bed mobility comments: safe technique with upright posture noted. Slightly impulsive and needs cues to wait prior to OOB mobility  Transfers Overall transfer level: Needs assistance Equipment used: Rolling walker (2 wheeled) Transfers: Sit to/from Stand Sit to Stand: Supervision         General transfer comment: safe technique performed with ability to push from seated surface. Upright posture noted  Ambulation/Gait Ambulation/Gait assistance: Supervision Ambulation Distance (Feet): 300 Feet Assistive device: Rolling walker (2 wheeled) Gait Pattern/deviations: Step-through pattern     General Gait Details: ambulated with reciprocal gait pattern and fluid speed. Symmetrical steps noted. RW used   Stairs Stairs: Yes Stairs assistance: Min guard Stair Management: One rail Right Number of Stairs: 4 General stair comments: ambulated up/down stairs x 2 reps  holding R railing. Demonstration given prior to performance. Family present to teaching.   Wheelchair Mobility    Modified Rankin (Stroke Patients Only)       Balance                                            Cognition Arousal/Alertness: Awake/alert Behavior During Therapy: WFL for tasks assessed/performed Overall Cognitive Status: Within Functional Limits for tasks assessed                                        Exercises Total Joint Exercises Goniometric ROM: R knee AOM: 0-101 degrees Other Exercises Other Exercises: seated ther-ex performed on R LE including ankle pumps, quad sets, SLR, hip abd/add, SAQ, and LAQ x 15 reps with cga. Safe technique performed Other Exercises: ambulated to bathroom with supervision and safe technique. Cues given for sequencing and upright posture. RW used    General Comments        Pertinent Vitals/Pain Pain Assessment: 0-10 Pain Score: 2  Pain Location: R knee Pain Descriptors / Indicators: Sore Pain Intervention(s): Limited activity within patient's tolerance;Premedicated before session;Ice applied    Home Living                      Prior Function            PT Goals (current goals can now be found in the care plan section) Acute Rehab PT Goals Patient Stated  Goal: to be more independent again PT Goal Formulation: With patient Time For Goal Achievement: 07/04/17 Potential to Achieve Goals: Good Progress towards PT goals: Progressing toward goals    Frequency    BID      PT Plan Current plan remains appropriate    Co-evaluation              AM-PAC PT "6 Clicks" Daily Activity  Outcome Measure  Difficulty turning over in bed (including adjusting bedclothes, sheets and blankets)?: None Difficulty moving from lying on back to sitting on the side of the bed? : None Difficulty sitting down on and standing up from a chair with arms (e.g., wheelchair, bedside commode,  etc,.)?: None Help needed moving to and from a bed to chair (including a wheelchair)?: None Help needed walking in hospital room?: None Help needed climbing 3-5 steps with a railing? : A Little 6 Click Score: 23    End of Session Equipment Utilized During Treatment: Gait belt Activity Tolerance: Patient tolerated treatment well Patient left: in chair;with chair alarm set;with SCD's reapplied Nurse Communication: Mobility status PT Visit Diagnosis: Muscle weakness (generalized) (M62.81);Difficulty in walking, not elsewhere classified (R26.2);Pain Pain - Right/Left: Right Pain - part of body: Knee     Time: 3419-3790 PT Time Calculation (min) (ACUTE ONLY): 38 min  Charges:  $Gait Training: 8-22 mins $Therapeutic Exercise: 8-22 mins $Therapeutic Activity: 8-22 mins                    G Codes:       [name], PT, DPT [phone]    Karolyn Messing 06/22/2017, 10:03 AM

## 2017-06-22 NOTE — Care Management (Signed)
RNCM notified by CSW that PT has requested rolling walker for patient. I have sent referral to [facility].

## 2017-06-22 NOTE — Discharge Summary (Signed)
Physician Discharge Summary  Patient ID: Amy Hess MRN: [id number] DOB/AGE: 05/12/1928 84 y.o.  Admit date: 06/20/2017 Discharge date: 06/22/2017  Admission Diagnoses:  osteoarthritis right knee  Discharge Diagnoses: Patient Active Problem List   Diagnosis Date Noted  . Anemia, unspecified 06/20/2017  . Eczema 06/20/2017  . Neutropenia ([place]) 06/20/2017  . S/P total knee arthroplasty 06/20/2017  . Chronic atrial fibrillation ([place]) 06/08/2017  . Tongue cancer ([place]) 06/29/2016  . Thyroid cancer ([place]) 06/29/2016  . Primary osteoarthritis of right knee 11/18/2015  . Choledocholithiasis 06/28/2014  . Choledocholithiasis with obstruction 06/27/2014  . Rheumatoid arthritis ([place]) 06/27/2014  . HTN (hypertension), benign 06/27/2014  . Hypothyroidism 06/27/2014  . Benign meningioma of brain ([place]) 12/12/2013  . Hiatal hernia 07/10/2013  . Squamous cell carcinoma 07/10/2013  . Hyperlipidemia 06/15/2013  . Trigeminal neuralgia 06/15/2013  Primary osteoarthritis of the right knee.  Past Medical History:  Diagnosis Date  . Anemia   . Arthritis   . [name] (coronary artery disease)   . Cancer ([place])    thyroid and mouth cancer 2013, squamous cell of tongue  . Diverticulosis   . Eczema   . GERD (gastroesophageal reflux disease)   . History of kidney stones   . Hypertension   . Osteoarthritis   . Rheumatoid arthritis ([place])      Transfusion: None.   Consultants (if any):   Discharged Condition: Improved  Hospital Course: Amy Hess is an 84 y.o. female who was admitted 06/20/2017 with a diagnosis of primary osteoarthritis of the right knee and went to the operating room on 06/20/2017 and underwent the above named procedures.    Surgeries: Procedure(s): COMPUTER ASSISTED TOTAL KNEE ARTHROPLASTY on 06/20/2017 Patient tolerated the surgery well. Taken to PACU where she was stabilized and then transferred to the orthopedic floor.  On POD1 patient developed delirium  and confusion.  Decreased pain medication to only tylenol and has improved following this modification.  Started on Xarelto 20mg  daily. Foot pumps applied bilaterally at 80 mm. Heels elevated on bed with rolled towels. No evidence of DVT. Negative [place]. Physical therapy started on day #1 for gait training and transfer. OT started day #1 for ADL and assisted devices.  Patient's IV and foley were d/c on POD1.  Hemovac removed on POD2.  Implants: DePuy Attune size 5 posterior stabilized femoral component (cemented), size 4 rotating platform tibial component (cemented), 35 mm medialized dome patella (cemented), and a 5 mm stabilized rotating platform polyethylene insert.  She was given perioperative antibiotics:  Anti-infectives (From admission, onward)   Start     Dose/Rate Route Frequency Ordered Stop   06/20/17 1400  ceFAZolin (ANCEF) IVPB 2g/100 mL premix     2 g 200 mL/hr over 30 Minutes Intravenous Every 6 hours 06/20/17 1242 06/21/17 1009   06/20/17 0617  ceFAZolin (ANCEF) 2-4 GM/100ML-% IVPB    Note to Pharmacy:  Norton Blizzard  : cabinet override      06/20/17 0617 06/20/17 0737   06/20/17 0600  ceFAZolin (ANCEF) IVPB 2g/100 mL premix     2 g 200 mL/hr over 30 Minutes Intravenous On call to O.R. 06/19/17 2221 06/20/17 3007    .  She was given sequential compression devices, early ambulation, and Xarelto for DVT prophylaxis.  She benefited maximally from the hospital stay and there were no complications.    Recent vital signs:  Vitals:   06/21/17 2355 06/22/17 0019  BP: (!) 169/123 (!) 160/102  Pulse: 86 80  Resp: 18 18  Temp: 98.7 F (37.1 C)   SpO2: 98%     Recent laboratory studies:  Lab Results  Component Value Date   HGB 12.2 06/21/2017   HGB 14.9 06/06/2017   HGB 14.2 06/29/2016   Lab Results  Component Value Date   WBC 9.9 06/21/2017   PLT 171 06/21/2017   Lab Results  Component Value Date   INR 0.89 06/06/2017   Lab Results  Component Value  Date   NA 138 06/21/2017   K 3.1 (L) 06/21/2017   CL 102 06/21/2017   CO2 27 06/21/2017   BUN 16 06/21/2017   CREATININE 1.09 (H) 06/21/2017   GLUCOSE 108 (H) 06/21/2017    Discharge Medications:   Allergies as of 06/22/2017      Reactions   Dyazide [hydrochlorothiazide W-triamterene] Other (See Comments)   On Chart ot MD office   Plaquenil [hydroxychloroquine Sulfate] Other (See Comments)   Not sure per MD office on chart   Sulfate Other (See Comments)   Pt not sure   Codeine Rash      Medication List    TAKE these medications   alendronate 70 MG tablet Commonly known as:  FOSAMAX Take 70 mg by mouth once a week. ** Tuesdays Take with a full glass of water on an empty stomach.   amLODipine 10 MG tablet Commonly known as:  NORVASC Take 5 mg by mouth every morning.   aspirin EC 81 MG tablet Take 81 mg by mouth daily.   Biotin 1000 MCG tablet Take 1,000 mcg by mouth daily.   CALCIUM 600 + D PO Take 1 tablet by mouth daily.   carvedilol 3.125 MG tablet Commonly known as:  COREG Take 1 tablet by mouth 2 (two) times daily.   celecoxib 200 MG capsule Commonly known as:  CELEBREX Take 1 capsule (200 mg total) by mouth 2 (two) times daily.   Flaxseed Oil 1000 MG Caps Take 3 capsules by mouth daily.   folic acid 1 MG tablet Commonly known as:  FOLVITE Take 1 mg by mouth daily.   KLOR-CON M10 10 MEQ tablet Generic drug:  potassium chloride Take 10 mEq by mouth daily.   levothyroxine 75 MCG tablet Commonly known as:  SYNTHROID, LEVOTHROID Take 75 mcg by mouth daily before breakfast.   multivitamin capsule Take 1 capsule by mouth daily.   predniSONE 5 MG tablet Commonly known as:  DELTASONE Take 5 mg by mouth daily with breakfast.   ranitidine 150 MG tablet Commonly known as:  ZANTAC Take 150 mg by mouth at bedtime.   torsemide 5 MG tablet Commonly known as:  DEMADEX Take 5 mg by mouth every morning.   traMADol 50 MG tablet Commonly known as:   ULTRAM Take 1-2 tablets (50-100 mg total) by mouth every 6 (six) hours as needed for moderate pain.   XARELTO 20 MG Tabs tablet Generic drug:  rivaroxaban Take 1 tablet by mouth daily with breakfast.            Durable Medical Equipment  (From admission, onward)        Start     Ordered   06/20/17 1243  DME Walker rolling  Once    Question:  Patient needs a walker to treat with the following condition  Answer:  Total knee replacement status   06/20/17 1242   06/20/17 1243  DME Bedside commode  Once    Question:  Patient needs a bedside commode to treat with the following condition  Answer:  Total knee replacement status   06/20/17 1242      Diagnostic Studies: Dg Knee Right Port  Result Date: 06/20/2017 CLINICAL DATA:  Status post right total knee replacement today. EXAM: PORTABLE RIGHT KNEE - 1-2 VIEW COMPARISON:  MRI right knee 01/12/2016. FINDINGS: New total arthroplasty is in place. No acute bony abnormality is seen. Surgical staples and drain are noted. IMPRESSION: Status post total knee arthroplasty.  No acute finding. Electronically Signed   By: Inge Rise M.D.   On: 06/20/2017 11:34   Disposition: Plan will be for discharge home today pending morning labs and working on stairs with PT.  Follow-up Information    Watt Climes, PA On 07/05/2017.   Specialty:  Physician Assistant Why:  at 10:15am Contact information: [place] [address] [phone]        Dereck Leep, MD On 08/02/2017.   Specialty:  Orthopedic Surgery Why:  at 10:45am Contact information: [place] [address] [phone]          Signed: Judson Roch PA-C 06/22/2017, 7:56 AM

## 2017-06-22 NOTE — Progress Notes (Signed)
  Subjective: 2 Days Post-Op Procedure(s) (LRB): COMPUTER ASSISTED TOTAL KNEE ARTHROPLASTY (Right) Patient reports pain as mild.   Confusion improved this morning, only using tylenol for relief. Patient is well, and has had no acute complaints or problems Plan is to go Home after hospital stay. Negative for chest pain and shortness of breath Fever: no Gastrointestinal:Negative for nausea and vomiting  Objective: Vital signs in last 24 hours: Temp:  [97.6 F (36.4 C)-98.7 F (37.1 C)] 98.7 F (37.1 C) (05/30 2355) Pulse Rate:  [77-87] 80 (05/31 0019) Resp:  [18] 18 (05/31 0019) BP: (108-169)/(62-123) 160/102 (05/31 0019) SpO2:  [98 %-100 %] 98 % (05/30 2355)  Intake/Output from previous day:  Intake/Output Summary (Last 24 hours) at 06/22/2017 0753 Last data filed at 06/21/2017 2300 Gross per 24 hour  Intake 370 ml  Output 150 ml  Net 220 ml    Intake/Output this shift: No intake/output data recorded.  Labs: Recent Labs    06/21/17 0731  HGB 12.2   Recent Labs    06/21/17 0731  WBC 9.9  RBC 3.87  HCT 36.2  PLT 171   Recent Labs    06/21/17 0731  NA 138  K 3.1*  CL 102  CO2 27  BUN 16  CREATININE 1.09*  GLUCOSE 108*  CALCIUM 7.8*   No results for input(s): LABPT, INR in the last 72 hours.   EXAM General - Patient is Alert, Appropriate and Oriented Extremity - ABD soft Sensation intact distally Intact pulses distally Dorsiflexion/Plantar flexion intact Incision: scant drainage No cellulitis present Dressing/Incision - blood tinged drainage, hemovac removed today, 4x4 with tegaderm applied. Motor Function - intact, moving foot and toes well on exam.  Abdomen soft with normal BS.  Past Medical History:  Diagnosis Date  . Anemia   . Arthritis   . [name] (coronary artery disease)   . Cancer ([place])    thyroid and mouth cancer 2013, squamous cell of tongue  . Diverticulosis   . Eczema   . GERD (gastroesophageal reflux disease)   . History of  kidney stones   . Hypertension   . Osteoarthritis   . Rheumatoid arthritis ([facility])     Assessment/Plan: 2 Days Post-Op Procedure(s) (LRB): COMPUTER ASSISTED TOTAL KNEE ARTHROPLASTY (Right) Active Problems:   S/P total knee arthroplasty  Estimated body mass index is 17.25 kg/m as calculated from the following:   Height as of this encounter: 5\' 4"  (1.626 m).   Weight as of this encounter: 45.6 kg (100 lb 8 oz). Up with therapy   Labs from yesterday reviewed, K+ 3.1, awaiting results from this morning before giving Klor-con. Pt has performed well with therapy, over 200 feet walking yesterday.  Needs to clear stairs today. Pt has had a BM. Pending PT progress, plan will be for discharge home today.  DVT Prophylaxis - Xarelto, Foot Pumps and TED hose Weight-Bearing as tolerated to right leg  J. Cameron Proud, PA-C [facility] 06/22/2017, 7:53 AM

## 2018-10-25 ENCOUNTER — Other Ambulatory Visit: Payer: Self-pay

## 2018-10-25 DIAGNOSIS — Z20822 Contact with and (suspected) exposure to covid-19: Secondary | ICD-10-CM

## 2018-10-26 LAB — NOVEL CORONAVIRUS, NAA: SARS-CoV-2, NAA: NOT DETECTED

## 2018-12-24 ENCOUNTER — Other Ambulatory Visit: Payer: Self-pay

## 2018-12-24 DIAGNOSIS — Z20822 Contact with and (suspected) exposure to covid-19: Secondary | ICD-10-CM

## 2018-12-26 LAB — NOVEL CORONAVIRUS, NAA: SARS-CoV-2, NAA: NOT DETECTED

## 2019-10-20 ENCOUNTER — Emergency Department: Payer: Medicare Other

## 2019-10-20 ENCOUNTER — Other Ambulatory Visit: Payer: Self-pay

## 2019-10-20 ENCOUNTER — Emergency Department: Payer: Self-pay

## 2019-10-20 ENCOUNTER — Inpatient Hospital Stay
Admission: EM | Admit: 2019-10-20 | Discharge: 2019-10-26 | DRG: 522 | Disposition: A | Discharge status: Home Health | Payer: Medicare Other | Attending: Internal Medicine | Admitting: Internal Medicine

## 2019-10-20 DIAGNOSIS — W19XXXA Unspecified fall, initial encounter: Secondary | ICD-10-CM | POA: Diagnosis present

## 2019-10-20 DIAGNOSIS — Z85831 Personal history of malignant neoplasm of soft tissue: Secondary | ICD-10-CM | POA: Diagnosis not present

## 2019-10-20 DIAGNOSIS — I482 Chronic atrial fibrillation, unspecified: Secondary | ICD-10-CM | POA: Diagnosis present

## 2019-10-20 DIAGNOSIS — Z7901 Long term (current) use of anticoagulants: Secondary | ICD-10-CM

## 2019-10-20 DIAGNOSIS — Z882 Allergy status to sulfonamides status: Secondary | ICD-10-CM | POA: Diagnosis not present

## 2019-10-20 DIAGNOSIS — Z85850 Personal history of malignant neoplasm of thyroid: Secondary | ICD-10-CM | POA: Diagnosis not present

## 2019-10-20 DIAGNOSIS — Z885 Allergy status to narcotic agent status: Secondary | ICD-10-CM

## 2019-10-20 DIAGNOSIS — K5790 Diverticulosis of intestine, part unspecified, without perforation or abscess without bleeding: Secondary | ICD-10-CM | POA: Diagnosis present

## 2019-10-20 DIAGNOSIS — W010XXA Fall on same level from slipping, tripping and stumbling without subsequent striking against object, initial encounter: Secondary | ICD-10-CM | POA: Diagnosis present

## 2019-10-20 DIAGNOSIS — G3183 Dementia with Lewy bodies: Secondary | ICD-10-CM | POA: Diagnosis present

## 2019-10-20 DIAGNOSIS — I2510 Atherosclerotic heart disease of native coronary artery without angina pectoris: Secondary | ICD-10-CM | POA: Diagnosis present

## 2019-10-20 DIAGNOSIS — Z66 Do not resuscitate: Secondary | ICD-10-CM | POA: Diagnosis present

## 2019-10-20 DIAGNOSIS — I10 Essential (primary) hypertension: Secondary | ICD-10-CM | POA: Diagnosis present

## 2019-10-20 DIAGNOSIS — Z79890 Hormone replacement therapy (postmenopausal): Secondary | ICD-10-CM

## 2019-10-20 DIAGNOSIS — F0281 Dementia in other diseases classified elsewhere with behavioral disturbance: Secondary | ICD-10-CM | POA: Diagnosis not present

## 2019-10-20 DIAGNOSIS — Z96651 Presence of right artificial knee joint: Secondary | ICD-10-CM | POA: Diagnosis present

## 2019-10-20 DIAGNOSIS — L309 Dermatitis, unspecified: Secondary | ICD-10-CM | POA: Diagnosis present

## 2019-10-20 DIAGNOSIS — E039 Hypothyroidism, unspecified: Secondary | ICD-10-CM | POA: Diagnosis not present

## 2019-10-20 DIAGNOSIS — S72002A Fracture of unspecified part of neck of left femur, initial encounter for closed fracture: Principal | ICD-10-CM | POA: Diagnosis present

## 2019-10-20 DIAGNOSIS — R339 Retention of urine, unspecified: Secondary | ICD-10-CM | POA: Diagnosis present

## 2019-10-20 DIAGNOSIS — Z7983 Long term (current) use of bisphosphonates: Secondary | ICD-10-CM

## 2019-10-20 DIAGNOSIS — M069 Rheumatoid arthritis, unspecified: Secondary | ICD-10-CM | POA: Diagnosis present

## 2019-10-20 DIAGNOSIS — M25559 Pain in unspecified hip: Secondary | ICD-10-CM

## 2019-10-20 DIAGNOSIS — K219 Gastro-esophageal reflux disease without esophagitis: Secondary | ICD-10-CM | POA: Diagnosis present

## 2019-10-20 DIAGNOSIS — Z7952 Long term (current) use of systemic steroids: Secondary | ICD-10-CM

## 2019-10-20 DIAGNOSIS — E890 Postprocedural hypothyroidism: Secondary | ICD-10-CM | POA: Diagnosis present

## 2019-10-20 DIAGNOSIS — Z888 Allergy status to other drugs, medicaments and biological substances status: Secondary | ICD-10-CM

## 2019-10-20 DIAGNOSIS — E785 Hyperlipidemia, unspecified: Secondary | ICD-10-CM | POA: Diagnosis present

## 2019-10-20 DIAGNOSIS — Z8249 Family history of ischemic heart disease and other diseases of the circulatory system: Secondary | ICD-10-CM

## 2019-10-20 DIAGNOSIS — F0280 Dementia in other diseases classified elsewhere without behavioral disturbance: Secondary | ICD-10-CM | POA: Diagnosis present

## 2019-10-20 DIAGNOSIS — D649 Anemia, unspecified: Secondary | ICD-10-CM | POA: Diagnosis present

## 2019-10-20 DIAGNOSIS — W19XXXS Unspecified fall, sequela: Secondary | ICD-10-CM | POA: Diagnosis not present

## 2019-10-20 DIAGNOSIS — S72002S Fracture of unspecified part of neck of left femur, sequela: Secondary | ICD-10-CM | POA: Diagnosis not present

## 2019-10-20 DIAGNOSIS — Z20822 Contact with and (suspected) exposure to covid-19: Secondary | ICD-10-CM | POA: Diagnosis present

## 2019-10-20 DIAGNOSIS — D320 Benign neoplasm of cerebral meninges: Secondary | ICD-10-CM | POA: Diagnosis present

## 2019-10-20 DIAGNOSIS — M1990 Unspecified osteoarthritis, unspecified site: Secondary | ICD-10-CM | POA: Diagnosis present

## 2019-10-20 DIAGNOSIS — Z7982 Long term (current) use of aspirin: Secondary | ICD-10-CM

## 2019-10-20 DIAGNOSIS — Y92019 Unspecified place in single-family (private) house as the place of occurrence of the external cause: Secondary | ICD-10-CM

## 2019-10-20 DIAGNOSIS — S72009A Fracture of unspecified part of neck of unspecified femur, initial encounter for closed fracture: Secondary | ICD-10-CM

## 2019-10-20 DIAGNOSIS — D696 Thrombocytopenia, unspecified: Secondary | ICD-10-CM | POA: Diagnosis not present

## 2019-10-20 DIAGNOSIS — F0390 Unspecified dementia without behavioral disturbance: Secondary | ICD-10-CM

## 2019-10-20 DIAGNOSIS — Z85810 Personal history of malignant neoplasm of tongue: Secondary | ICD-10-CM

## 2019-10-20 DIAGNOSIS — Z79899 Other long term (current) drug therapy: Secondary | ICD-10-CM

## 2019-10-20 HISTORY — DX: Unspecified dementia, unspecified severity, without behavioral disturbance, psychotic disturbance, mood disturbance, and anxiety: F03.90

## 2019-10-20 LAB — COMPREHENSIVE METABOLIC PANEL
ALT: 20 U/L (ref 0–44)
AST: 29 U/L (ref 15–41)
Albumin: 4.1 g/dL (ref 3.5–5.0)
Alkaline Phosphatase: 45 U/L (ref 38–126)
Anion gap: 10 (ref 5–15)
BUN: 18 mg/dL (ref 8–23)
CO2: 28 mmol/L (ref 22–32)
Calcium: 8.9 mg/dL (ref 8.9–10.3)
Chloride: 102 mmol/L (ref 98–111)
Creatinine, Ser: 0.94 mg/dL (ref 0.44–1.00)
GFR calc Af Amer: >60 mL/min (ref >60)
GFR calc non Af Amer: 54 mL/min — ABNORMAL LOW (ref >60)
Glucose, Bld: 109 mg/dL — ABNORMAL HIGH (ref 70–99)
Potassium: 3.5 mmol/L (ref 3.5–5.1)
Sodium: 140 mmol/L (ref 135–145)
Total Bilirubin: 1.6 mg/dL — ABNORMAL HIGH (ref 0.3–1.2)
Total Protein: 6.8 g/dL (ref 6.5–8.1)

## 2019-10-20 LAB — CBC WITH DIFFERENTIAL/PLATELET
Abs Immature Granulocytes: 0.03 10*3/uL (ref 0.00–0.07)
Basophils Absolute: 0 10*3/uL (ref 0.0–0.1)
Basophils Relative: 1 %
Eosinophils Absolute: 0.1 10*3/uL (ref 0.0–0.5)
Eosinophils Relative: 1 %
HCT: 40.2 % (ref 36.0–46.0)
Hemoglobin: 13.6 g/dL (ref 12.0–15.0)
Immature Granulocytes: 0 %
Lymphocytes Relative: 13 %
Lymphs Abs: 1 10*3/uL (ref 0.7–4.0)
MCH: 31.6 pg (ref 26.0–34.0)
MCHC: 33.8 g/dL (ref 30.0–36.0)
MCV: 93.3 fL (ref 80.0–100.0)
Monocytes Absolute: 0.7 10*3/uL (ref 0.1–1.0)
Monocytes Relative: 9 %
Neutro Abs: 5.8 10*3/uL (ref 1.7–7.7)
Neutrophils Relative %: 76 %
Platelets: 148 10*3/uL — ABNORMAL LOW (ref 150–400)
RBC: 4.31 MIL/uL (ref 3.87–5.11)
RDW: 13.4 % (ref 11.5–15.5)
WBC: 7.7 10*3/uL (ref 4.0–10.5)
nRBC: 0 % (ref 0.0–0.2)

## 2019-10-20 LAB — RESPIRATORY PANEL BY RT PCR (FLU A&B, COVID)
Influenza A by PCR: NEGATIVE
Influenza B by PCR: NEGATIVE
SARS Coronavirus 2 by RT PCR: NEGATIVE

## 2019-10-20 LAB — PROTIME-INR
INR: 1.3 — ABNORMAL HIGH (ref 0.8–1.2)
Prothrombin Time: 15.3 seconds — ABNORMAL HIGH (ref 11.4–15.2)

## 2019-10-20 LAB — TYPE AND SCREEN
ABO/RH(D): O POS
Antibody Screen: NEGATIVE

## 2019-10-20 LAB — APTT: aPTT: 37 seconds — ABNORMAL HIGH (ref 24–36)

## 2019-10-20 LAB — BRAIN NATRIURETIC PEPTIDE: B Natriuretic Peptide: 315.3 pg/mL — ABNORMAL HIGH (ref 0.0–100.0)

## 2019-10-20 MED ORDER — MORPHINE SULFATE (PF) 2 MG/ML IV SOLN
0.5000 mg | INTRAVENOUS | Status: DC | PRN
  Administered 2019-10-21 – 2019-10-22 (×2): 0.5 mg via INTRAVENOUS
  Filled 2019-10-20 (×2): qty 1

## 2019-10-20 MED ORDER — QUETIAPINE FUMARATE 25 MG PO TABS
25.0000 mg | ORAL_TABLET | Freq: Two times a day (BID) | ORAL | Status: DC
  Administered 2019-10-20 – 2019-10-26 (×11): 25 mg via ORAL
  Filled 2019-10-20 (×11): qty 1

## 2019-10-20 MED ORDER — OXYCODONE-ACETAMINOPHEN 5-325 MG PO TABS: 1.0000 | ORAL_TABLET | ORAL | Status: DC | PRN

## 2019-10-20 MED ORDER — CALCIUM CARBONATE-VITAMIN D 500-200 MG-UNIT PO TABS
1.0000 | ORAL_TABLET | Freq: Every day | ORAL | Status: DC
  Administered 2019-10-21 – 2019-10-25 (×4): 1 via ORAL
  Filled 2019-10-20 (×4): qty 1

## 2019-10-20 MED ORDER — CARVEDILOL 3.125 MG PO TABS
3.1250 mg | ORAL_TABLET | Freq: Two times a day (BID) | ORAL | Status: DC
  Administered 2019-10-20 – 2019-10-26 (×11): 3.125 mg via ORAL
  Filled 2019-10-20 (×11): qty 1

## 2019-10-20 MED ORDER — TORSEMIDE 10 MG PO TABS
5.0000 mg | ORAL_TABLET | Freq: Every day | ORAL | Status: DC
  Administered 2019-10-21 – 2019-10-26 (×5): 5 mg via ORAL
  Filled 2019-10-20 (×8): qty 0.5

## 2019-10-20 MED ORDER — ACETAMINOPHEN 500 MG PO TABS
1000.0000 mg | ORAL_TABLET | Freq: Once | ORAL | Status: AC
  Administered 2019-10-20: 1000 mg via ORAL
  Filled 2019-10-20: qty 2

## 2019-10-20 MED ORDER — MELATONIN 5 MG PO TABS
2.5000 mg | ORAL_TABLET | Freq: Every day | ORAL | Status: DC
  Administered 2019-10-20 – 2019-10-25 (×6): 2.5 mg via ORAL
  Filled 2019-10-20 (×3): qty 1
  Filled 2019-10-20: qty 0.5
  Filled 2019-10-20 (×2): qty 1
  Filled 2019-10-20: qty 0.5
  Filled 2019-10-20: qty 1

## 2019-10-20 MED ORDER — BIOTIN 1000 MCG PO TABS: 1000.0000 ug | ORAL_TABLET | Freq: Every day | ORAL | Status: DC

## 2019-10-20 MED ORDER — LORATADINE 10 MG PO TABS
10.0000 mg | ORAL_TABLET | Freq: Every day | ORAL | Status: DC
  Administered 2019-10-20 – 2019-10-25 (×5): 10 mg via ORAL
  Filled 2019-10-20 (×5): qty 1

## 2019-10-20 MED ORDER — MEMANTINE HCL 5 MG PO TABS
10.0000 mg | ORAL_TABLET | Freq: Two times a day (BID) | ORAL | Status: DC
  Administered 2019-10-20 – 2019-10-26 (×11): 10 mg via ORAL
  Filled 2019-10-20 (×11): qty 2

## 2019-10-20 MED ORDER — ACETAMINOPHEN 325 MG PO TABS
650.0000 mg | ORAL_TABLET | Freq: Four times a day (QID) | ORAL | Status: DC | PRN
  Administered 2019-10-21 (×2): 650 mg via ORAL
  Filled 2019-10-20 (×2): qty 2

## 2019-10-20 MED ORDER — LEVOTHYROXINE SODIUM 50 MCG PO TABS
75.0000 ug | ORAL_TABLET | Freq: Every day | ORAL | Status: DC
  Administered 2019-10-21 – 2019-10-24 (×3): 75 ug via ORAL
  Filled 2019-10-20: qty 1
  Filled 2019-10-20 (×2): qty 2
  Filled 2019-10-20 (×2): qty 1

## 2019-10-20 MED ORDER — OXYCODONE HCL 5 MG PO TABS: 2.5000 mg | ORAL_TABLET | Freq: Four times a day (QID) | ORAL | Status: DC | PRN

## 2019-10-20 MED ORDER — HYDROCORTISONE NA SUCCINATE PF 100 MG IJ SOLR
50.0000 mg | Freq: Once | INTRAMUSCULAR | Status: AC
  Administered 2019-10-20: 50 mg via INTRAVENOUS
  Filled 2019-10-20: qty 2

## 2019-10-20 MED ORDER — SENNOSIDES-DOCUSATE SODIUM 8.6-50 MG PO TABS: 1.0000 | ORAL_TABLET | Freq: Every evening | ORAL | Status: DC | PRN

## 2019-10-20 MED ORDER — METHOCARBAMOL 500 MG PO TABS
500.0000 mg | ORAL_TABLET | Freq: Three times a day (TID) | ORAL | Status: DC | PRN
  Administered 2019-10-23: 500 mg via ORAL
  Filled 2019-10-20 (×2): qty 1

## 2019-10-20 MED ORDER — ONDANSETRON HCL 4 MG/2ML IJ SOLN: 4.0000 mg | Freq: Three times a day (TID) | INTRAMUSCULAR | Status: DC | PRN

## 2019-10-20 MED ORDER — FAMOTIDINE 20 MG PO TABS
20.0000 mg | ORAL_TABLET | Freq: Every day | ORAL | Status: DC
  Administered 2019-10-20 – 2019-10-23 (×4): 20 mg via ORAL
  Filled 2019-10-20 (×4): qty 1

## 2019-10-20 MED ORDER — PREDNISONE 10 MG PO TABS
10.0000 mg | ORAL_TABLET | Freq: Every day | ORAL | Status: DC
  Administered 2019-10-21 – 2019-10-26 (×5): 10 mg via ORAL
  Filled 2019-10-20 (×6): qty 1

## 2019-10-20 MED ORDER — LORAZEPAM 2 MG/ML IJ SOLN
0.2500 mg | Freq: Four times a day (QID) | INTRAMUSCULAR | Status: DC | PRN
  Administered 2019-10-20: 0.25 mg via INTRAVENOUS
  Filled 2019-10-20: qty 1

## 2019-10-20 MED ORDER — FOLIC ACID 1 MG PO TABS
1.0000 mg | ORAL_TABLET | Freq: Every day | ORAL | Status: DC
  Administered 2019-10-20 – 2019-10-26 (×6): 1 mg via ORAL
  Filled 2019-10-20 (×6): qty 1

## 2019-10-20 MED ORDER — HYDRALAZINE HCL 20 MG/ML IJ SOLN
5.0000 mg | INTRAMUSCULAR | Status: DC | PRN
  Administered 2019-10-22: 5 mg via INTRAVENOUS
  Filled 2019-10-20 (×2): qty 1

## 2019-10-20 MED ORDER — FLAXSEED OIL 1000 MG PO CAPS: 1.0000 | ORAL_CAPSULE | Freq: Every day | ORAL | Status: DC

## 2019-10-20 MED ORDER — MORPHINE SULFATE (PF) 2 MG/ML IV SOLN
0.5000 mg | INTRAVENOUS | Status: DC | PRN
  Administered 2019-10-20: 0.5 mg via INTRAVENOUS
  Filled 2019-10-20: qty 1

## 2019-10-20 MED ORDER — ADULT MULTIVITAMIN W/MINERALS CH
1.0000 | ORAL_TABLET | Freq: Every day | ORAL | Status: DC
  Administered 2019-10-21 – 2019-10-24 (×3): 1 via ORAL
  Filled 2019-10-20 (×3): qty 1

## 2019-10-20 NOTE — Consult Note (Signed)
ORTHOPAEDIC CONSULTATION  REQUESTING PHYSICIAN: Ivor Costa, MD  Chief Complaint: left hip pain s/p fall  HPI: Amy Hess is a 86 y.o. female with Lewy body dementia and a.fib on xarelto who presented to [facility] urgent care after the patient sustained a fall at home walking around her bed.  Fall was witnessed by the daughter by video monitor.   The patient lives with the daughter's family.  She is an independent ambulator without an assist device at baseline.  Patient is unable to provide an accurate history due to her dementia, but her daughter is with her in the ER.  Patient was diagnosed with a displaced left femoral neck hip fracture on xray at [facility] urgent care earlier today and transferred to the ER for definitive care.   The patient is being admitted to the hospitalist service.   Orthopaedics is consulted for management of her hip fracture.   The patient's last dose of xarelto was yesterday AM according to her daughter.  Past Medical History:  Diagnosis Date  . Anemia   . Arthritis   . [name] (coronary artery disease)   . Cancer ([place])    thyroid and mouth cancer 2013, squamous cell of tongue  . Diverticulosis   . Eczema   . GERD (gastroesophageal reflux disease)   . History of kidney stones   . Hypertension   . Osteoarthritis   . Rheumatoid arthritis [facility])    Past Surgical History:  Procedure Laterality Date  . CHOLECYSTECTOMY    . ERCP N/A 06/28/2014   Procedure: ENDOSCOPIC RETROGRADE CHOLANGIOPANCREATOGRAPHY (ERCP);  Surgeon: Arta Silence, MD;  Location: [facility] ENDOSCOPY;  Service: Endoscopy;  Laterality: N/A;  . ESOPHAGOGASTRODUODENOSCOPY (EGD) WITH PROPOFOL N/A 08/19/2014   Procedure: ESOPHAGOGASTRODUODENOSCOPY (EGD) WITH PROPOFOL;  Surgeon: Arta Silence, MD;  Location: [facility] ENDOSCOPY;  Service: Endoscopy;  Laterality: N/A;  . GASTROINTESTINAL STENT REMOVAL N/A 08/19/2014   Procedure: GASTROINTESTINAL STENT REMOVAL;  Surgeon: Arta Silence, MD;  Location: [facility]  ENDOSCOPY;  Service: Endoscopy;  Laterality: N/A;  . KNEE ARTHROPLASTY Right 06/20/2017   Procedure: COMPUTER ASSISTED TOTAL KNEE ARTHROPLASTY;  Surgeon: Dereck Leep, MD;  Location: [facility] ORS;  Service: Orthopedics;  Laterality: Right;  . laser varicose vein surgery    . TOTAL THYROIDECTOMY    . uterine polyp removal  09/17/1998   Social History   Socioeconomic History  . Marital status: Widowed    Spouse name: Not on file  . Number of children: Not on file  . Years of education: Not on file  . Highest education level: Not on file  Occupational History  . Not on file  Tobacco Use  . Smoking status: Never Smoker  . Smokeless tobacco: Never Used  Vaping Use  . Vaping Use: Never used  Substance and Sexual Activity  . Alcohol use: No  . Drug use: No  . Sexual activity: Not on file  Other Topics Concern  . Not on file  Social History Narrative  . Not on file   Social Determinants of Health   Financial Resource Strain:   . Difficulty of Paying Living Expenses: Not on file  Food Insecurity:   . Worried About Charity fundraiser in the Last Year: Not on file  . Ran Out of Food in the Last Year: Not on file  Transportation Needs:   . Lack of Transportation (Medical): Not on file  . Lack of Transportation (Non-Medical): Not on file  Physical Activity:   . Days of Exercise per Week: Not  on file  . Minutes of Exercise per Session: Not on file  Stress:   . Feeling of Stress : Not on file  Social Connections:   . Frequency of Communication with Friends and Family: Not on file  . Frequency of Social Gatherings with Friends and Family: Not on file  . Attends Religious Services: Not on file  . Active Member of Clubs or Organizations: Not on file  . Attends Archivist Meetings: Not on file  . Marital Status: Not on file   Family History  Problem Relation Age of Onset  . Heart attack Mother   . Heart attack Father   . Heart attack Brother    Allergies  Allergen  Reactions  . Dyazide [Hydrochlorothiazide W-Triamterene] Other (See Comments)    On Chart ot MD office  . Plaquenil [Hydroxychloroquine Sulfate] Other (See Comments)    Not sure per MD office on chart   . Sulfate Other (See Comments)    Pt not sure  . Codeine Rash   Prior to Admission medications   Medication Sig Start Date End Date Taking? Authorizing Provider  acetaminophen (TYLENOL) 500 MG tablet Take 1,000 mg by mouth every 6 (six) hours as needed.   Yes [provider]  Biotin 1000 MCG tablet Take 1,000 mcg by mouth daily.   Yes [provider]  Calcium Carb-Cholecalciferol (CALCIUM 600 + D PO) Take 1 tablet by mouth daily.   Yes [provider]  carvedilol (COREG) 3.125 MG tablet Take 1 tablet by mouth 2 (two) times daily. 06/08/17 10/20/19 Yes [provider]  cetirizine (ZYRTEC) 10 MG tablet Take 10 mg by mouth at bedtime.   Yes [provider]  Flaxseed, Linseed, (FLAXSEED OIL) 1000 MG CAPS Take 1 capsule by mouth daily.    Yes [provider]  folic acid (FOLVITE) 1 MG tablet Take 1 mg by mouth daily.  02/18/14  Yes [provider]  KLOR-CON M10 10 MEQ tablet Take 10 mEq by mouth daily. 06/16/14  Yes [provider]  levothyroxine (SYNTHROID, LEVOTHROID) 75 MCG tablet Take 75 mcg by mouth daily before breakfast.  06/08/14  Yes [provider]  melatonin 3 MG TABS tablet Take 3 mg by mouth at bedtime.   Yes [provider]  memantine (NAMENDA) 10 MG tablet Take 10 mg by mouth 2 (two) times daily. 08/01/19  Yes [provider]  Multiple Vitamin (MULTIVITAMIN) capsule Take 1 capsule by mouth daily.    Yes [provider]  predniSONE (DELTASONE) 5 MG tablet Take 5 mg by mouth daily with breakfast.  05/27/14  Yes [provider]  QUEtiapine (SEROQUEL) 25 MG tablet Take 25 mg by mouth 2 (two) times daily. 10/15/19  Yes [provider]  ranitidine (ZANTAC) 150 MG tablet  Take 150 mg by mouth at bedtime.   Yes [provider]  rivaroxaban (XARELTO) 20 MG TABS tablet Take 1 tablet by mouth daily with supper.  06/15/17  Yes [provider]  torsemide (DEMADEX) 5 MG tablet Take 5 mg by mouth at bedtime.  06/04/14  Yes [provider]  alendronate (FOSAMAX) 70 MG tablet Take 70 mg by mouth once a week. ** Tuesdays Take with a full glass of water on an empty stomach.    [provider]  celecoxib (CELEBREX) 200 MG capsule Take 1 capsule (200 mg total) by mouth 2 (two) times daily. Patient not taking: Reported on 10/20/2019 06/21/17   Lattie Corns, PA-C  traMADol (ULTRAM) 50 MG tablet Take 1-2 tablets (50-100 mg total) by mouth every 6 (six) hours as needed for moderate pain. Patient not taking: Reported on 10/20/2019 06/21/17   Lattie Corns, PA-C   DG Chest 1 View  Result Date: 10/20/2019 CLINICAL DATA:  Unwitnessed fall. EXAM: CHEST  1 VIEW COMPARISON:  None. FINDINGS: Mild cardiomegaly is noted. No pneumothorax or pleural effusion is noted. Both lungs are clear. The visualized skeletal structures are unremarkable. IMPRESSION: No active disease. Electronically Signed   By: Marijo Conception M.D.   On: 10/20/2019 12:45   DG Thoracic Spine 2 View  Result Date: 10/20/2019 CLINICAL DATA:  Fall. EXAM: THORACIC SPINE 2 VIEWS COMPARISON:  No recent prior. FINDINGS: Thoracolumbar spine scoliosis and degenerative change. No acute bony abnormality identified. Mitral annular calcification noted. Biapical pleuroparenchymal thickening most consistent with scarring. Surgical clips right upper quadrant. IMPRESSION: Thoracolumbar spine scoliosis and degenerative change. No acute abnormality identified. Electronically Signed   By: Marcello Moores  Register   On: 10/20/2019 13:11   DG Lumbar Spine 2-3 Views  Result Date: 10/20/2019 CLINICAL DATA:  Fall sustaining a left femoral neck fracture. EXAM: LUMBAR SPINE - 2-3 VIEW COMPARISON:  CT abdomen and  pelvis 06/09/2014 FINDINGS: There are 5 non rib-bearing lumbar type vertebrae. There is mild-to-moderate lumbar levoscoliosis with apex at L2-3. There is no significant listhesis. No lumbar spine fracture is identified. There is moderate asymmetric disc space narrowing on the right at L2-3 and L3-4 and on the left at L4-5. Cholecystectomy clips and aortic atherosclerosis are noted. IMPRESSION: Multilevel lumbar disc degeneration without an acute osseous abnormality identified. Electronically Signed   By: Logan Bores M.D.   On: 10/20/2019 13:11   CT Head Wo Contrast  Result Date: 10/20/2019 CLINICAL DATA:  Fall sustaining a left femoral neck fracture. EXAM: CT HEAD WITHOUT CONTRAST CT CERVICAL SPINE WITHOUT CONTRAST TECHNIQUE: Multidetector CT imaging of the head and cervical spine was performed following the standard protocol without intravenous contrast. Multiplanar CT image reconstructions of the cervical spine were also generated. COMPARISON:  Head MRI 12/02/2012.  Neck CT 05/09/2012. FINDINGS: CT HEAD FINDINGS Brain: No acute infarct, intracranial hemorrhage, midline shift, or extra-axial fluid collection is identified. Mild cerebral atrophy is within normal limits for age. Patchy hypodensities in the cerebral white matter bilaterally are nonspecific but compatible with moderate chronic small vessel ischemic disease which has likely progressed from the prior MRI. A chronic lacunar infarct in the right caudate nucleus is new. A partially calcified extra-axial mass laterally over the left cerebral convexity at the level of the operculum has enlarged and now measures 20 x 8 mm. A 13 x 7 mm extra-axial mass along the posterior aspect of the right temporal bone posterior and superior to the internal auditory canal has not significantly changed in size. Vascular: Calcified atherosclerosis at the skull base. No hyperdense vessel. Skull: No fracture or suspicious osseous lesion. Sinuses/Orbits: Visualized paranasal  sinuses and mastoid air cells are clear. Visualized orbits are unremarkable. Other: None. CT CERVICAL SPINE FINDINGS Alignment: Slight reversal of the normal cervical lordosis with 2 mm anterolisthesis of C4 on C5 and 2 mm retrolisthesis of C5 on C6. Skull base and vertebrae: No acute fracture or suspicious osseous lesion. Moderate median C1-2 arthropathy. Soft tissues and spinal canal: No prevertebral fluid or swelling. No visible canal hematoma. Disc levels: Moderate disc space narrowing and degenerative endplate changes at C3-7 and C6-7 with uncovertebral spurring resulting in mild neural foraminal stenosis. No evidence of significant  spinal stenosis. Mild left facet arthrosis at C3-4 and C4-5. Upper chest: Biapical pleuroparenchymal lung scarring. Other: Heavily calcified atherosclerotic plaque at the left greater than right carotid bifurcations. IMPRESSION: 1. No evidence of acute intracranial abnormality. 2. Moderate chronic small vessel ischemic disease. 3. Interval enlargement of left cerebral convexity meningioma. 4. Unchanged posterior fossa meningioma. 5. No evidence of acute cervical spine fracture. Electronically Signed   By: Logan Bores M.D.   On: 10/20/2019 13:22   CT Cervical Spine Wo Contrast  Result Date: 10/20/2019 CLINICAL DATA:  Fall sustaining a left femoral neck fracture. EXAM: CT HEAD WITHOUT CONTRAST CT CERVICAL SPINE WITHOUT CONTRAST TECHNIQUE: Multidetector CT imaging of the head and cervical spine was performed following the standard protocol without intravenous contrast. Multiplanar CT image reconstructions of the cervical spine were also generated. COMPARISON:  Head MRI 12/02/2012.  Neck CT 05/09/2012. FINDINGS: CT HEAD FINDINGS Brain: No acute infarct, intracranial hemorrhage, midline shift, or extra-axial fluid collection is identified. Mild cerebral atrophy is within normal limits for age. Patchy hypodensities in the cerebral white matter bilaterally are nonspecific but  compatible with moderate chronic small vessel ischemic disease which has likely progressed from the prior MRI. A chronic lacunar infarct in the right caudate nucleus is new. A partially calcified extra-axial mass laterally over the left cerebral convexity at the level of the operculum has enlarged and now measures 20 x 8 mm. A 13 x 7 mm extra-axial mass along the posterior aspect of the right temporal bone posterior and superior to the internal auditory canal has not significantly changed in size. Vascular: Calcified atherosclerosis at the skull base. No hyperdense vessel. Skull: No fracture or suspicious osseous lesion. Sinuses/Orbits: Visualized paranasal sinuses and mastoid air cells are clear. Visualized orbits are unremarkable. Other: None. CT CERVICAL SPINE FINDINGS Alignment: Slight reversal of the normal cervical lordosis with 2 mm anterolisthesis of C4 on C5 and 2 mm retrolisthesis of C5 on C6. Skull base and vertebrae: No acute fracture or suspicious osseous lesion. Moderate median C1-2 arthropathy. Soft tissues and spinal canal: No prevertebral fluid or swelling. No visible canal hematoma. Disc levels: Moderate disc space narrowing and degenerative endplate changes at O2-7 and C6-7 with uncovertebral spurring resulting in mild neural foraminal stenosis. No evidence of significant spinal stenosis. Mild left facet arthrosis at C3-4 and C4-5. Upper chest: Biapical pleuroparenchymal lung scarring. Other: Heavily calcified atherosclerotic plaque at the left greater than right carotid bifurcations. IMPRESSION: 1. No evidence of acute intracranial abnormality. 2. Moderate chronic small vessel ischemic disease. 3. Interval enlargement of left cerebral convexity meningioma. 4. Unchanged posterior fossa meningioma. 5. No evidence of acute cervical spine fracture. Electronically Signed   By: Logan Bores M.D.   On: 10/20/2019 13:22    Positive ROS: All other systems have been reviewed and were otherwise negative  with the exception of those mentioned in the HPI and as above.  Physical Exam: General: Awake, no acute distress   MUSCULOSKELETAL: Left lower extremity:  Shortened and externally rotated.  Skin intact.  Spontaneous movement of the left toes and ankle.  Pedal pulses are palpable.   Compartments are soft.  No significant edema or swelling.  Assessment: Left displaced femoral neck hip fracture  Plan: I have discussed the patient's fracture with her daughter.   I have recommended a hemiarthroplasty.   I discussed the details of the operation and the post-op course.  The daughter expressed an interest in bringing the patient home after discharge from the hospital.   I  also discussed with the patient's daughter, the risks and benefits of surgery. The risks include but are not limited to infection, bleeding requiring blood transfusion, nerve or blood vessel injury, joint stiffness or loss of motion, persistent pain, weakness or instability, leg length discrepancy and change in lower extremity rotation, periprosthetic fracture, dislocation and hardware failure and the need for further surgery. Medical risks include but are not limited to DVT and pulmonary embolism, myocardial infarction, stroke, pneumonia, respiratory failure and death. Patient's daughter understood these risks and wished to proceed.   Surgery is scheduled for Thursday due to the patient receiving Xarelto yesterday.  I have reviewed the xrays and labs in preparation for this case.   The patient is being admitted to the hospitalist service for pre-op clearance.  Xarelto is on hold.    Patient had significant post-op delirium after a right TKA by Dr. Marry Guan in 2019, which seemed to be exacerbated by oxycodone.  The patient's daughter also states that haldol should not be used with her mother due to her Lewy body dementia.   Thornton Park, MD    10/20/2019 7:37 PM

## 2019-10-20 NOTE — ED Notes (Signed)
Admit MD  Blaine Hamper at bedside

## 2019-10-20 NOTE — ED Notes (Signed)
Per [facility] in [place] type and screen is hemolyzed. Lab to come and collect type and screen for 3rd collection attempt.

## 2019-10-20 NOTE — ED Provider Notes (Signed)
[facility] Emergency Department Provider Note  ____________________________________________   First MD Initiated Contact with Patient 10/20/19 1202     (approximate)  I have reviewed the triage vital signs and the nursing notes.   HISTORY  Chief Complaint Fall   HPI Amy Hess is a 86 y.o. female with a past medical history of Lewy body dementia, a-fib on xarelto, [name], HTN, RA, anemia, arthritis, GERD, thyroid cancer, and eczema who presents via EMS from [facility] after she was evaluated for left hip pain after a unwitnessed fall onto carpet that occurred yesterday.  Per EMS patient had x-ray obtained of her left hip that showed a fracture.  Patient complains of some mild pain in her left hip but denies any other acute complaints.  She is in providing additional significant history secondary dementia.  She is accompanied by her daughter at bedside who notes patient is anticoagulated on Xarelto and she is typically ambulatory but does live at home with 24-hour care.  Her daughter states she has video monitors in her home and she saw the patient tripped and falling onto her left hip yesterday.  She is not sure if the patient struck her head.  No other recent falls or injuries.  Patient is otherwise been in her usual state health.         Past Medical History:  Diagnosis Date  . Anemia   . Arthritis   . [name] (coronary artery disease)   . Cancer ([place])    thyroid and mouth cancer 2013, squamous cell of tongue  . Diverticulosis   . Eczema   . GERD (gastroesophageal reflux disease)   . History of kidney stones   . Hypertension   . Osteoarthritis   . Rheumatoid arthritis [facility])     Patient Active Problem List   Diagnosis Date Noted  . Anemia, unspecified 06/20/2017  . Eczema 06/20/2017  . Neutropenia ([place]) 06/20/2017  . S/P total knee arthroplasty 06/20/2017  . Chronic atrial fibrillation ([place]) 06/08/2017  . Tongue cancer ([place]) 06/29/2016  .  Thyroid cancer ([place]) 06/29/2016  . Primary osteoarthritis of right knee 11/18/2015  . Choledocholithiasis 06/28/2014  . Choledocholithiasis with obstruction 06/27/2014  . Rheumatoid arthritis ([place]) 06/27/2014  . HTN (hypertension), benign 06/27/2014  . Hypothyroidism 06/27/2014  . Benign meningioma of brain ([place]) 12/12/2013  . Hiatal hernia 07/10/2013  . Squamous cell carcinoma 07/10/2013  . Hyperlipidemia 06/15/2013  . Trigeminal neuralgia 06/15/2013    Past Surgical History:  Procedure Laterality Date  . CHOLECYSTECTOMY    . ERCP N/A 06/28/2014   Procedure: ENDOSCOPIC RETROGRADE CHOLANGIOPANCREATOGRAPHY (ERCP);  Surgeon: Arta Silence, MD;  Location: [facility] ENDOSCOPY;  Service: Endoscopy;  Laterality: N/A;  . ESOPHAGOGASTRODUODENOSCOPY (EGD) WITH PROPOFOL N/A 08/19/2014   Procedure: ESOPHAGOGASTRODUODENOSCOPY (EGD) WITH PROPOFOL;  Surgeon: Arta Silence, MD;  Location: [facility] ENDOSCOPY;  Service: Endoscopy;  Laterality: N/A;  . GASTROINTESTINAL STENT REMOVAL N/A 08/19/2014   Procedure: GASTROINTESTINAL STENT REMOVAL;  Surgeon: Arta Silence, MD;  Location: [facility] ENDOSCOPY;  Service: Endoscopy;  Laterality: N/A;  . KNEE ARTHROPLASTY Right 06/20/2017   Procedure: COMPUTER ASSISTED TOTAL KNEE ARTHROPLASTY;  Surgeon: Dereck Leep, MD;  Location: [facility] ORS;  Service: Orthopedics;  Laterality: Right;  . laser varicose vein surgery    . TOTAL THYROIDECTOMY    . uterine polyp removal  09/17/1998    Prior to Admission medications   Medication Sig Start Date End Date Taking? Authorizing Provider  alendronate (FOSAMAX) 70 MG tablet Take 70 mg by mouth  once a week. ** Tuesdays Take with a full glass of water on an empty stomach.    [provider]  amLODipine (NORVASC) 10 MG tablet Take 5 mg by mouth every morning.  03/22/14   [provider]  aspirin EC 81 MG tablet Take 81 mg by mouth daily.    [provider]  Biotin 1000 MCG tablet Take 1,000 mcg by mouth daily.     [provider]  Calcium Carb-Cholecalciferol (CALCIUM 600 + D PO) Take 1 tablet by mouth daily.    [provider]  carvedilol (COREG) 3.125 MG tablet Take 1 tablet by mouth 2 (two) times daily. 06/08/17 06/08/18  [provider]  celecoxib (CELEBREX) 200 MG capsule Take 1 capsule (200 mg total) by mouth 2 (two) times daily. 06/21/17   Lattie Corns, PA-C  Flaxseed, Linseed, (FLAXSEED OIL) 1000 MG CAPS Take 3 capsules by mouth daily.    [provider]  folic acid (FOLVITE) 1 MG tablet Take 1 mg by mouth daily.  02/18/14   [provider]  KLOR-CON M10 10 MEQ tablet Take 10 mEq by mouth daily. 06/16/14   [provider]  levothyroxine (SYNTHROID, LEVOTHROID) 75 MCG tablet Take 75 mcg by mouth daily before breakfast.  06/08/14   [provider]  Multiple Vitamin (MULTIVITAMIN) capsule Take 1 capsule by mouth daily.     [provider]  predniSONE (DELTASONE) 5 MG tablet Take 5 mg by mouth daily with breakfast.  05/27/14   [provider]  ranitidine (ZANTAC) 150 MG tablet Take 150 mg by mouth at bedtime.    [provider]  rivaroxaban (XARELTO) 20 MG TABS tablet Take 1 tablet by mouth daily with breakfast. 06/15/17   [provider]  torsemide (DEMADEX) 5 MG tablet Take 5 mg by mouth every morning.  06/04/14   [provider]  traMADol (ULTRAM) 50 MG tablet Take 1-2 tablets (50-100 mg total) by mouth every 6 (six) hours as needed for moderate pain. 06/21/17   Lattie Corns, PA-C    Allergies Dyazide [hydrochlorothiazide w-triamterene], Plaquenil [hydroxychloroquine sulfate], Sulfate, and Codeine  Family History  Problem Relation Age of Onset  . Heart attack Mother   . Heart attack Father   . Heart attack Brother     Social History Social History   Tobacco Use  . Smoking status: Never Smoker  . Smokeless tobacco: Never Used  Vaping Use  . Vaping Use: Never used  Substance Use  Topics  . Alcohol use: No  . Drug use: No    Review of Systems  Review of Systems  Unable to perform ROS: Dementia  Musculoskeletal: Positive for back pain ( lower back) and myalgias ( L hip).  Neurological: Positive for focal weakness ( L hip).      ____________________________________________   PHYSICAL EXAM:  VITAL SIGNS: ED Triage Vitals  Enc Vitals Group     BP      Pulse      Resp      Temp      Temp src      SpO2      Weight      Height      Head Circumference      Peak Flow      Pain Score      Pain Loc      Pain Edu?      Excl. in [place]?    Vitals:   10/20/19 1224 10/20/19 1230  BP: (!) 155/102 128/84  Pulse: 99 86  Resp: 18 13  Temp: 98 F (36.7 C)   SpO2: 100% 99%   Physical Exam Vitals and nursing note reviewed.  Constitutional:      General: She is not in acute distress.    Appearance: She is well-developed.  HENT:     Head: Normocephalic and atraumatic.     Right Ear: External ear normal.     Left Ear: External ear normal.     Nose: Nose normal.     Mouth/Throat:     Mouth: Mucous membranes are moist.  Eyes:     Conjunctiva/sclera: Conjunctivae normal.  Cardiovascular:     Rate and Rhythm: Normal rate and regular rhythm.     Heart sounds: No murmur heard.   Pulmonary:     Effort: Pulmonary effort is normal. No respiratory distress.     Breath sounds: Normal breath sounds.  Abdominal:     Palpations: Abdomen is soft.     Tenderness: There is no abdominal tenderness.  Musculoskeletal:     Cervical back: Neck supple.  Skin:    General: Skin is warm and dry.     Capillary Refill: Capillary refill takes less than 2 seconds.  Neurological:     Mental Status: She is alert. Mental status is at baseline. She is disoriented.  Psychiatric:        Mood and Affect: Mood normal.     Mild tenderness over the anterior lateral aspect of the left hip.  Patient is decreased joint on left hip flexion extension compared to the right hip.  She is  able to move her toes in both feet on command.  Sensation is intact to light touch throughout both lower extremities.  2+ bilateral DP pulses.  ____________________________________________   LABS (all labs ordered are listed, but only abnormal results are displayed)  Labs Reviewed  CBC WITH DIFFERENTIAL/PLATELET - Abnormal; Notable for the following components:      Result Value   Platelets 148 (*)    All other components within normal limits  COMPREHENSIVE METABOLIC PANEL - Abnormal; Notable for the following components:   Glucose, Bld 109 (*)    Total Bilirubin 1.6 (*)    GFR calc non Af Amer 54 (*)    All other components within normal limits  RESPIRATORY PANEL BY RT PCR (FLU A&B, COVID)  PROTIME-INR   ____________________________________________  EKG  A. fib with a ventricular rate of 82, normal axis, unremarkable intervals, ____________________________________________  RADIOLOGY  Official radiology report(s): DG Chest 1 View  Result Date: 10/20/2019 CLINICAL DATA:  Unwitnessed fall. EXAM: CHEST  1 VIEW COMPARISON:  None. FINDINGS: Mild cardiomegaly is noted. No pneumothorax or pleural effusion is noted. Both lungs are clear. The visualized skeletal structures are unremarkable. IMPRESSION: No active disease. Electronically Signed   By: Marijo Conception M.D.   On: 10/20/2019 12:45   DG Thoracic Spine 2 View  Result Date: 10/20/2019 CLINICAL DATA:  Fall. EXAM: THORACIC SPINE 2 VIEWS COMPARISON:  No recent prior. FINDINGS: Thoracolumbar spine scoliosis and degenerative change. No acute bony abnormality identified. Mitral annular calcification noted. Biapical pleuroparenchymal thickening most consistent with scarring. Surgical clips right upper quadrant. IMPRESSION: Thoracolumbar spine scoliosis and degenerative change. No acute abnormality identified. Electronically Signed   By: Marcello Moores  Register   On: 10/20/2019 13:11   DG Lumbar Spine 2-3 Views  Result Date:  10/20/2019 CLINICAL DATA:  Fall sustaining a left femoral neck fracture. EXAM: LUMBAR SPINE -  2-3 VIEW COMPARISON:  CT abdomen and pelvis 06/09/2014 FINDINGS: There are 5 non rib-bearing lumbar type vertebrae. There is mild-to-moderate lumbar levoscoliosis with apex at L2-3. There is no significant listhesis. No lumbar spine fracture is identified. There is moderate asymmetric disc space narrowing on the right at L2-3 and L3-4 and on the left at L4-5. Cholecystectomy clips and aortic atherosclerosis are noted. IMPRESSION: Multilevel lumbar disc degeneration without an acute osseous abnormality identified. Electronically Signed   By: Logan Bores M.D.   On: 10/20/2019 13:11   CT Head Wo Contrast  Result Date: 10/20/2019 CLINICAL DATA:  Fall sustaining a left femoral neck fracture. EXAM: CT HEAD WITHOUT CONTRAST CT CERVICAL SPINE WITHOUT CONTRAST TECHNIQUE: Multidetector CT imaging of the head and cervical spine was performed following the standard protocol without intravenous contrast. Multiplanar CT image reconstructions of the cervical spine were also generated. COMPARISON:  Head MRI 12/02/2012.  Neck CT 05/09/2012. FINDINGS: CT HEAD FINDINGS Brain: No acute infarct, intracranial hemorrhage, midline shift, or extra-axial fluid collection is identified. Mild cerebral atrophy is within normal limits for age. Patchy hypodensities in the cerebral white matter bilaterally are nonspecific but compatible with moderate chronic small vessel ischemic disease which has likely progressed from the prior MRI. A chronic lacunar infarct in the right caudate nucleus is new. A partially calcified extra-axial mass laterally over the left cerebral convexity at the level of the operculum has enlarged and now measures 20 x 8 mm. A 13 x 7 mm extra-axial mass along the posterior aspect of the right temporal bone posterior and superior to the internal auditory canal has not significantly changed in size. Vascular: Calcified  atherosclerosis at the skull base. No hyperdense vessel. Skull: No fracture or suspicious osseous lesion. Sinuses/Orbits: Visualized paranasal sinuses and mastoid air cells are clear. Visualized orbits are unremarkable. Other: None. CT CERVICAL SPINE FINDINGS Alignment: Slight reversal of the normal cervical lordosis with 2 mm anterolisthesis of C4 on C5 and 2 mm retrolisthesis of C5 on C6. Skull base and vertebrae: No acute fracture or suspicious osseous lesion. Moderate median C1-2 arthropathy. Soft tissues and spinal canal: No prevertebral fluid or swelling. No visible canal hematoma. Disc levels: Moderate disc space narrowing and degenerative endplate changes at H4-1 and C6-7 with uncovertebral spurring resulting in mild neural foraminal stenosis. No evidence of significant spinal stenosis. Mild left facet arthrosis at C3-4 and C4-5. Upper chest: Biapical pleuroparenchymal lung scarring. Other: Heavily calcified atherosclerotic plaque at the left greater than right carotid bifurcations. IMPRESSION: 1. No evidence of acute intracranial abnormality. 2. Moderate chronic small vessel ischemic disease. 3. Interval enlargement of left cerebral convexity meningioma. 4. Unchanged posterior fossa meningioma. 5. No evidence of acute cervical spine fracture. Electronically Signed   By: Logan Bores M.D.   On: 10/20/2019 13:22   CT Cervical Spine Wo Contrast  Result Date: 10/20/2019 CLINICAL DATA:  Fall sustaining a left femoral neck fracture. EXAM: CT HEAD WITHOUT CONTRAST CT CERVICAL SPINE WITHOUT CONTRAST TECHNIQUE: Multidetector CT imaging of the head and cervical spine was performed following the standard protocol without intravenous contrast. Multiplanar CT image reconstructions of the cervical spine were also generated. COMPARISON:  Head MRI 12/02/2012.  Neck CT 05/09/2012. FINDINGS: CT HEAD FINDINGS Brain: No acute infarct, intracranial hemorrhage, midline shift, or extra-axial fluid collection is identified.  Mild cerebral atrophy is within normal limits for age. Patchy hypodensities in the cerebral white matter bilaterally are nonspecific but compatible with moderate chronic small vessel ischemic disease which has likely progressed from the  prior MRI. A chronic lacunar infarct in the right caudate nucleus is new. A partially calcified extra-axial mass laterally over the left cerebral convexity at the level of the operculum has enlarged and now measures 20 x 8 mm. A 13 x 7 mm extra-axial mass along the posterior aspect of the right temporal bone posterior and superior to the internal auditory canal has not significantly changed in size. Vascular: Calcified atherosclerosis at the skull base. No hyperdense vessel. Skull: No fracture or suspicious osseous lesion. Sinuses/Orbits: Visualized paranasal sinuses and mastoid air cells are clear. Visualized orbits are unremarkable. Other: None. CT CERVICAL SPINE FINDINGS Alignment: Slight reversal of the normal cervical lordosis with 2 mm anterolisthesis of C4 on C5 and 2 mm retrolisthesis of C5 on C6. Skull base and vertebrae: No acute fracture or suspicious osseous lesion. Moderate median C1-2 arthropathy. Soft tissues and spinal canal: No prevertebral fluid or swelling. No visible canal hematoma. Disc levels: Moderate disc space narrowing and degenerative endplate changes at W4-3 and C6-7 with uncovertebral spurring resulting in mild neural foraminal stenosis. No evidence of significant spinal stenosis. Mild left facet arthrosis at C3-4 and C4-5. Upper chest: Biapical pleuroparenchymal lung scarring. Other: Heavily calcified atherosclerotic plaque at the left greater than right carotid bifurcations. IMPRESSION: 1. No evidence of acute intracranial abnormality. 2. Moderate chronic small vessel ischemic disease. 3. Interval enlargement of left cerebral convexity meningioma. 4. Unchanged posterior fossa meningioma. 5. No evidence of acute cervical spine fracture. Electronically  Signed   By: Logan Bores M.D.   On: 10/20/2019 13:22    ____________________________________________   PROCEDURES  Procedure(s) performed (including Critical Care):  .1-3 Lead EKG Interpretation Performed by: Lucrezia Starch, MD Authorized by: Lucrezia Starch, MD     Interpretation: normal     ECG rate assessment: normal     Rhythm: atrial fibrillation     Ectopy: none     Conduction: normal       ____________________________________________   INITIAL IMPRESSION / ASSESSMENT AND PLAN / ED COURSE        Patient presents with [place] to history exam for assessment of left hip pain in the setting of concern for unwitnessed fall that occurred yesterday.  Patient is slightly hypertensive with a BP of 155/102 with otherwise stable vital signs on arrival.  Exam as above.  Evidence of distal neurovascular deficit in the left hip which is otherwise weaker and mildly tender.  Outside x-rays were reviewed by myself and are remarkable for left femoral neck fracture.  Given patient is anticoagulated and may have struck her head also obtain CT head which did not show evidence of intracranial bleeding.  Patient is noted to have a meningioma.  In addition I obtained a CT C-spine that did not show evidence of acute fracture or dislocation.  Plain films of patient's T and L-spine also did not show evidence of acute fracture or dislocation.  All images were reviewed by myself and taken to consideration during medical decision-making.  CBC and CMP are unremarkable.  Covid is negative.  I did discuss patient's presentation with on-call orthopedist Dr. Mack Guise who recommended hospital admission for likely surgical management either tomorrow or the following day pending washout from patient Xarelto.  We will plan to admit to hospitalist service.  Impression is left femoral neck fracture secondary to mechanical ground-level fall.  No findings to 6 other significant visceral orthopedic injury.   Presentation is not consistent with CVA, significant metabolic derangement, ACS, or significant bleeding at this  time.  ____________________________________________   FINAL CLINICAL IMPRESSION(S) / ED DIAGNOSES  Final diagnoses:  Closed fracture of left hip, initial encounter ([place])  Dementia without behavioral disturbance, unspecified dementia type (HCC)  Anticoagulated    Medications  acetaminophen (TYLENOL) tablet 1,000 mg (has no administration in time range)     ED Discharge Orders    None       Note:  This document was prepared using Dragon voice recognition software and may include unintentional dictation errors.   Lucrezia Starch, MD 10/20/19 1340

## 2019-10-20 NOTE — Progress Notes (Signed)
PHARMACIST - PHYSICIAN ORDER COMMUNICATION  CONCERNING: P&T Medication Policy on Herbal Medications  DESCRIPTION:  This patient's order for:  Flaxseed Oil and Biotin has been noted.  This product(s) is classified as an "herbal" or natural product. Due to a lack of definitive safety studies or FDA approval, nonstandard manufacturing practices, plus the potential risk of unknown drug-drug interactions while on inpatient medications, the Pharmacy and Therapeutics Committee does not permit the use of "herbal" or natural products of this type within [facility].   ACTION TAKEN: The pharmacy department is unable to verify this order at this time.  Please reevaluate patient's clinical condition at discharge and address if the herbal or natural product(s) should be resumed at that time.  Pernell Dupre, PharmD, BCPS Clinical Pharmacist 10/20/2019 6:06 PM

## 2019-10-20 NOTE — H&P (Addendum)
History and Physical    Amy Hess:[id number] DOB: 10-07-1928 DOA: 10/20/2019  Referring MD/NP/PA:   PCP: Kirk Ruths, MD   Patient coming from:  The patient is coming from home.  At baseline, pt is dependent for most of ADL.        Chief Complaint: fall and left hip pain  HPI: Amy Hess is a 86 y.o. female with medical history significant of Lewy body dementia, hypertension, hyperlipidemia, GERD, hypothyroidism, rheumatoid arthritis, squamous cell cancer (thyroid, mouth and tongue), [name], diverticulosis, benign brain meningioma, [name], who presents with fall and left hip pain.  Per her daughter at bedside, pt had witnessed the fall on carpet yesterday. Her daughter states she has video monitors in patient's home and she saw the patient tripped and falling onto her left hip yesterday.  Patient developed the pain in the left hip.  She has mild left hipe pain at rest, but more painful when putting weight on the left leg. Daughter is not sure if the patient struck her head. Pt was seen by [facility] and had X-ray done which showed left femoral neck fracture.   Per her daughter, pt can walk and is active normally, but needs 24-hour care.  Patient is taking torsemide 5 mg daily, but her daughter denies history of CHF or myocardial infarction.  Patient has history of [name] on aspirin. Patient does not have recent chest pain. Today patient does not have chest pain, cough, shortness breath.  No leg edema. No fever or chills.  No nausea, vomiting, diarrhea, abdominal pain, symptoms of UTI.  No facial droop or slurred speech.  Per her daughter, patient's mental status is at her baseline.  Her last dose of Xarelto was yesterday morning.   ED Course: pt was found to have WBC 7.7, hemoglobin 13.6, negative Covid PCR, electrolytes renal function okay, temperature normal, blood pressure 128/84, heart rate 86, RR 18, oxygen saturation 99% on room air.  Chest  x-ray negative.  X-ray of T-spine and L-spine showed degenerative disease, but negative for acute bony fracture.  Patient is admitted to [facility] bed as inpatient.  CT of head and C-spin: 1. No evidence of acute intracranial abnormality. 2. Moderate chronic small vessel ischemic disease. 3. Interval enlargement of left cerebral convexity meningioma. 4. Unchanged posterior fossa meningioma. 5. No evidence of acute cervical spine fracture.  Review of Systems: Could not reviewed due to dementia.    Allergy:  Allergies  Allergen Reactions  . Dyazide [Hydrochlorothiazide W-Triamterene] Other (See Comments)    On Chart ot MD office  . Plaquenil [Hydroxychloroquine Sulfate] Other (See Comments)    Not sure per MD office on chart   . Sulfate Other (See Comments)    Pt not sure  . Codeine Rash    Past Medical History:  Diagnosis Date  . Anemia   . Arthritis   . [name] (coronary artery disease)   . Cancer ([place])    thyroid and mouth cancer 2013, squamous cell of tongue  . Diverticulosis   . Eczema   . GERD (gastroesophageal reflux disease)   . History of kidney stones   . Hypertension   . Osteoarthritis   . Rheumatoid arthritis [facility])     Past Surgical History:  Procedure Laterality Date  . CHOLECYSTECTOMY    . ERCP N/A 06/28/2014   Procedure: ENDOSCOPIC RETROGRADE CHOLANGIOPANCREATOGRAPHY (ERCP);  Surgeon: Arta Silence, MD;  Location: [facility] ENDOSCOPY;  Service: Endoscopy;  Laterality: N/A;  . ESOPHAGOGASTRODUODENOSCOPY (EGD)  WITH PROPOFOL N/A 08/19/2014   Procedure: ESOPHAGOGASTRODUODENOSCOPY (EGD) WITH PROPOFOL;  Surgeon: Arta Silence, MD;  Location: [facility] ENDOSCOPY;  Service: Endoscopy;  Laterality: N/A;  . GASTROINTESTINAL STENT REMOVAL N/A 08/19/2014   Procedure: GASTROINTESTINAL STENT REMOVAL;  Surgeon: Arta Silence, MD;  Location: [facility] ENDOSCOPY;  Service: Endoscopy;  Laterality: N/A;  . KNEE ARTHROPLASTY Right 06/20/2017   Procedure: COMPUTER ASSISTED TOTAL KNEE ARTHROPLASTY;   Surgeon: Dereck Leep, MD;  Location: [facility] ORS;  Service: Orthopedics;  Laterality: Right;  . laser varicose vein surgery    . TOTAL THYROIDECTOMY    . uterine polyp removal  09/17/1998    Social History:  reports that she has never smoked. She has never used smokeless tobacco. She reports that she does not drink alcohol and does not use drugs.  Family History:  Family History  Problem Relation Age of Onset  . Heart attack Mother   . Heart attack Father   . Heart attack Brother      Prior to Admission medications   Medication Sig Start Date End Date Taking? Authorizing Provider  alendronate (FOSAMAX) 70 MG tablet Take 70 mg by mouth once a week. ** Tuesdays Take with a full glass of water on an empty stomach.    [provider]  amLODipine (NORVASC) 10 MG tablet Take 5 mg by mouth every morning.  03/22/14   [provider]  aspirin EC 81 MG tablet Take 81 mg by mouth daily.    [provider]  Biotin 1000 MCG tablet Take 1,000 mcg by mouth daily.    [provider]  Calcium Carb-Cholecalciferol (CALCIUM 600 + D PO) Take 1 tablet by mouth daily.    [provider]  carvedilol (COREG) 3.125 MG tablet Take 1 tablet by mouth 2 (two) times daily. 06/08/17 06/08/18  [provider]  celecoxib (CELEBREX) 200 MG capsule Take 1 capsule (200 mg total) by mouth 2 (two) times daily. 06/21/17   Lattie Corns, PA-C  Flaxseed, Linseed, (FLAXSEED OIL) 1000 MG CAPS Take 3 capsules by mouth daily.    [provider]  folic acid (FOLVITE) 1 MG tablet Take 1 mg by mouth daily.  02/18/14   [provider]  KLOR-CON M10 10 MEQ tablet Take 10 mEq by mouth daily. 06/16/14   [provider]  levothyroxine (SYNTHROID, LEVOTHROID) 75 MCG tablet Take 75 mcg by mouth daily before breakfast.  06/08/14   [provider]  Multiple Vitamin (MULTIVITAMIN) capsule Take 1 capsule by mouth daily.     [provider]    predniSONE (DELTASONE) 5 MG tablet Take 5 mg by mouth daily with breakfast.  05/27/14   [provider]  ranitidine (ZANTAC) 150 MG tablet Take 150 mg by mouth at bedtime.    [provider]  rivaroxaban (XARELTO) 20 MG TABS tablet Take 1 tablet by mouth daily with breakfast. 06/15/17   [provider]  torsemide (DEMADEX) 5 MG tablet Take 5 mg by mouth every morning.  06/04/14   [provider]  traMADol (ULTRAM) 50 MG tablet Take 1-2 tablets (50-100 mg total) by mouth every 6 (six) hours as needed for moderate pain. 06/21/17   Lattie Corns, PA-C    Physical Exam: Vitals:   10/20/19 1224 10/20/19 1230 10/20/19 1445 10/20/19 1453  BP: (!) 155/102 128/84  (!) 140/98  Pulse: 99 86  85  Resp: 18 13 (!) 21 19  Temp: 98 F (36.7 C)     SpO2: 100%  99%  99%  Weight:      Height:       General: Not in acute distress HEENT:       Eyes: PERRL, EOMI, no scleral icterus.       ENT: No discharge from the ears and nose, no pharynx injection, no tonsillar enlargement.        Neck: No JVD, no bruit, no mass felt. Heme: No neck lymph node enlargement. Cardiac: S1/S2, RRR, No murmurs, No gallops or rubs. Respiratory: No rales, wheezing, rhonchi or rubs. GI: Soft, nondistended, nontender, no rebound pain, no organomegaly, BS present. GU: No hematuria Ext: No pitting leg edema bilaterally. 1+DP/PT pulse bilaterally. Musculoskeletal: Has left hip tenderness Skin: No rashes.  Neuro: Alert, following commands, cranial nerves II-XII grossly intact, moves all extremities. Psych: Patient is not psychotic, no suicidal or hemocidal ideation.  Labs on Admission: I have personally reviewed following labs and imaging studies  CBC: Recent Labs  Lab 10/20/19 1218  WBC 7.7  NEUTROABS 5.8  HGB 13.6  HCT 40.2  MCV 93.3  PLT 240*   Basic Metabolic Panel: Recent Labs  Lab 10/20/19 1218  NA 140  K 3.5  CL 102  CO2 28  GLUCOSE 109*  BUN 18  CREATININE 0.94   CALCIUM 8.9   GFR: Estimated Creatinine Clearance: 29.1 mL/min (by C-G formula based on SCr of 0.94 mg/dL). Liver Function Tests: Recent Labs  Lab 10/20/19 1218  AST 29  ALT 20  ALKPHOS 45  BILITOT 1.6*  PROT 6.8  ALBUMIN 4.1   No results for input(s): LIPASE, AMYLASE in the last 168 hours. No results for input(s): AMMONIA in the last 168 hours. Coagulation Profile: Recent Labs  Lab 10/20/19 1505  INR 1.3*   Cardiac Enzymes: No results for input(s): CKTOTAL, CKMB, CKMBINDEX, TROPONINI in the last 168 hours. BNP (last 3 results) No results for input(s): PROBNP in the last 8760 hours. HbA1C: No results for input(s): HGBA1C in the last 72 hours. CBG: No results for input(s): GLUCAP in the last 168 hours. Lipid Profile: No results for input(s): CHOL, HDL, LDLCALC, TRIG, CHOLHDL, LDLDIRECT in the last 72 hours. Thyroid Function Tests: No results for input(s): TSH, T4TOTAL, FREET4, T3FREE, THYROIDAB in the last 72 hours. Anemia Panel: No results for input(s): VITAMINB12, FOLATE, FERRITIN, TIBC, IRON, RETICCTPCT in the last 72 hours. Urine analysis:    Component Value Date/Time   COLORURINE STRAW (A) 06/06/2017 1015   APPEARANCEUR CLEAR (A) 06/06/2017 1015   LABSPEC 1.006 06/06/2017 1015   PHURINE 8.0 06/06/2017 1015   GLUCOSEU NEGATIVE 06/06/2017 1015   HGBUR NEGATIVE 06/06/2017 1015   BILIRUBINUR NEGATIVE 06/06/2017 1015   KETONESUR NEGATIVE 06/06/2017 1015   PROTEINUR NEGATIVE 06/06/2017 1015   NITRITE NEGATIVE 06/06/2017 1015   LEUKOCYTESUR NEGATIVE 06/06/2017 1015   Sepsis Labs: @LABRCNTIP (procalcitonin:4,lacticidven:4) ) Recent Results (from the past 240 hour(s))  Respiratory Panel by RT PCR (Flu A&B, Covid) - Nasopharyngeal Swab     Status: None   Collection Time: 10/20/19 12:18 PM   Specimen: Nasopharyngeal Swab  Result Value Ref Range Status   SARS Coronavirus 2 by RT PCR NEGATIVE NEGATIVE Final    Comment: (NOTE) SARS-CoV-2 target nucleic acids are  NOT DETECTED.  The SARS-CoV-2 RNA is generally detectable in upper respiratoy specimens during the acute phase of infection. The lowest concentration of SARS-CoV-2 viral copies this assay can detect is 131 copies/mL. A negative result does not preclude SARS-Cov-2 infection and should not be used as the sole basis  for treatment or other patient management decisions. A negative result may occur with  improper specimen collection/handling, submission of specimen other than nasopharyngeal swab, presence of viral mutation(s) within the areas targeted by this assay, and inadequate number of viral copies (<131 copies/mL). A negative result must be combined with clinical observations, patient history, and epidemiological information. The expected result is Negative.  Fact Sheet for Patients:  PinkCheek.be  Fact Sheet for Healthcare Providers:  GravelBags.it  This test is no t yet approved or cleared by the [place] FDA and  has been authorized for detection and/or diagnosis of SARS-CoV-2 by FDA under an Emergency Use Authorization (EUA). This EUA will remain  in effect (meaning this test can be used) for the duration of the COVID-19 declaration under Section 564(b)(1) of the Act, 21 U.S.C. section 360bbb-3(b)(1), unless the authorization is terminated or revoked sooner.     Influenza A by PCR NEGATIVE NEGATIVE Final   Influenza B by PCR NEGATIVE NEGATIVE Final    Comment: (NOTE) The Xpert Xpress SARS-CoV-2/FLU/RSV assay is intended as an aid in  the diagnosis of influenza from Nasopharyngeal swab specimens and  should not be used as a sole basis for treatment. Nasal washings and  aspirates are unacceptable for Xpert Xpress SARS-CoV-2/FLU/RSV  testing.  Fact Sheet for Patients: PinkCheek.be  Fact Sheet for Healthcare Providers: GravelBags.it  This test is not yet  approved or cleared by the [place] FDA and  has been authorized for detection and/or diagnosis of SARS-CoV-2 by  FDA under an Emergency Use Authorization (EUA). This EUA will remain  in effect (meaning this test can be used) for the duration of the  Covid-19 declaration under Section 564(b)(1) of the Act, 21  U.S.C. section 360bbb-3(b)(1), unless the authorization is  terminated or revoked. Performed at [facility], [address] [place], [place] [address]      Radiological Exams on Admission: DG Chest 1 View  Result Date: 10/20/2019 CLINICAL DATA:  Unwitnessed fall. EXAM: CHEST  1 VIEW COMPARISON:  None. FINDINGS: Mild cardiomegaly is noted. No pneumothorax or pleural effusion is noted. Both lungs are clear. The visualized skeletal structures are unremarkable. IMPRESSION: No active disease. Electronically Signed   By: Marijo Conception M.D.   On: 10/20/2019 12:45   DG Thoracic Spine 2 View  Result Date: 10/20/2019 CLINICAL DATA:  Fall. EXAM: THORACIC SPINE 2 VIEWS COMPARISON:  No recent prior. FINDINGS: Thoracolumbar spine scoliosis and degenerative change. No acute bony abnormality identified. Mitral annular calcification noted. Biapical pleuroparenchymal thickening most consistent with scarring. Surgical clips right upper quadrant. IMPRESSION: Thoracolumbar spine scoliosis and degenerative change. No acute abnormality identified. Electronically Signed   By: Marcello Moores  Register   On: 10/20/2019 13:11   DG Lumbar Spine 2-3 Views  Result Date: 10/20/2019 CLINICAL DATA:  Fall sustaining a left femoral neck fracture. EXAM: LUMBAR SPINE - 2-3 VIEW COMPARISON:  CT abdomen and pelvis 06/09/2014 FINDINGS: There are 5 non rib-bearing lumbar type vertebrae. There is mild-to-moderate lumbar levoscoliosis with apex at L2-3. There is no significant listhesis. No lumbar spine fracture is identified. There is moderate asymmetric disc space narrowing on the right at L2-3 and L3-4 and on the  left at L4-5. Cholecystectomy clips and aortic atherosclerosis are noted. IMPRESSION: Multilevel lumbar disc degeneration without an acute osseous abnormality identified. Electronically Signed   By: Logan Bores M.D.   On: 10/20/2019 13:11   CT Head Wo Contrast  Result Date: 10/20/2019 CLINICAL DATA:  Fall sustaining a left  femoral neck fracture. EXAM: CT HEAD WITHOUT CONTRAST CT CERVICAL SPINE WITHOUT CONTRAST TECHNIQUE: Multidetector CT imaging of the head and cervical spine was performed following the standard protocol without intravenous contrast. Multiplanar CT image reconstructions of the cervical spine were also generated. COMPARISON:  Head MRI 12/02/2012.  Neck CT 05/09/2012. FINDINGS: CT HEAD FINDINGS Brain: No acute infarct, intracranial hemorrhage, midline shift, or extra-axial fluid collection is identified. Mild cerebral atrophy is within normal limits for age. Patchy hypodensities in the cerebral white matter bilaterally are nonspecific but compatible with moderate chronic small vessel ischemic disease which has likely progressed from the prior MRI. A chronic lacunar infarct in the right caudate nucleus is new. A partially calcified extra-axial mass laterally over the left cerebral convexity at the level of the operculum has enlarged and now measures 20 x 8 mm. A 13 x 7 mm extra-axial mass along the posterior aspect of the right temporal bone posterior and superior to the internal auditory canal has not significantly changed in size. Vascular: Calcified atherosclerosis at the skull base. No hyperdense vessel. Skull: No fracture or suspicious osseous lesion. Sinuses/Orbits: Visualized paranasal sinuses and mastoid air cells are clear. Visualized orbits are unremarkable. Other: None. CT CERVICAL SPINE FINDINGS Alignment: Slight reversal of the normal cervical lordosis with 2 mm anterolisthesis of C4 on C5 and 2 mm retrolisthesis of C5 on C6. Skull base and vertebrae: No acute fracture or suspicious  osseous lesion. Moderate median C1-2 arthropathy. Soft tissues and spinal canal: No prevertebral fluid or swelling. No visible canal hematoma. Disc levels: Moderate disc space narrowing and degenerative endplate changes at Y4-0 and C6-7 with uncovertebral spurring resulting in mild neural foraminal stenosis. No evidence of significant spinal stenosis. Mild left facet arthrosis at C3-4 and C4-5. Upper chest: Biapical pleuroparenchymal lung scarring. Other: Heavily calcified atherosclerotic plaque at the left greater than right carotid bifurcations. IMPRESSION: 1. No evidence of acute intracranial abnormality. 2. Moderate chronic small vessel ischemic disease. 3. Interval enlargement of left cerebral convexity meningioma. 4. Unchanged posterior fossa meningioma. 5. No evidence of acute cervical spine fracture. Electronically Signed   By: Logan Bores M.D.   On: 10/20/2019 13:22   CT Cervical Spine Wo Contrast  Result Date: 10/20/2019 CLINICAL DATA:  Fall sustaining a left femoral neck fracture. EXAM: CT HEAD WITHOUT CONTRAST CT CERVICAL SPINE WITHOUT CONTRAST TECHNIQUE: Multidetector CT imaging of the head and cervical spine was performed following the standard protocol without intravenous contrast. Multiplanar CT image reconstructions of the cervical spine were also generated. COMPARISON:  Head MRI 12/02/2012.  Neck CT 05/09/2012. FINDINGS: CT HEAD FINDINGS Brain: No acute infarct, intracranial hemorrhage, midline shift, or extra-axial fluid collection is identified. Mild cerebral atrophy is within normal limits for age. Patchy hypodensities in the cerebral white matter bilaterally are nonspecific but compatible with moderate chronic small vessel ischemic disease which has likely progressed from the prior MRI. A chronic lacunar infarct in the right caudate nucleus is new. A partially calcified extra-axial mass laterally over the left cerebral convexity at the level of the operculum has enlarged and now measures  20 x 8 mm. A 13 x 7 mm extra-axial mass along the posterior aspect of the right temporal bone posterior and superior to the internal auditory canal has not significantly changed in size. Vascular: Calcified atherosclerosis at the skull base. No hyperdense vessel. Skull: No fracture or suspicious osseous lesion. Sinuses/Orbits: Visualized paranasal sinuses and mastoid air cells are clear. Visualized orbits are unremarkable. Other: None. CT CERVICAL SPINE FINDINGS Alignment:  Slight reversal of the normal cervical lordosis with 2 mm anterolisthesis of C4 on C5 and 2 mm retrolisthesis of C5 on C6. Skull base and vertebrae: No acute fracture or suspicious osseous lesion. Moderate median C1-2 arthropathy. Soft tissues and spinal canal: No prevertebral fluid or swelling. No visible canal hematoma. Disc levels: Moderate disc space narrowing and degenerative endplate changes at N3-6 and C6-7 with uncovertebral spurring resulting in mild neural foraminal stenosis. No evidence of significant spinal stenosis. Mild left facet arthrosis at C3-4 and C4-5. Upper chest: Biapical pleuroparenchymal lung scarring. Other: Heavily calcified atherosclerotic plaque at the left greater than right carotid bifurcations. IMPRESSION: 1. No evidence of acute intracranial abnormality. 2. Moderate chronic small vessel ischemic disease. 3. Interval enlargement of left cerebral convexity meningioma. 4. Unchanged posterior fossa meningioma. 5. No evidence of acute cervical spine fracture. Electronically Signed   By: Logan Bores M.D.   On: 10/20/2019 13:22     EKG: Independently reviewed.  Sinus rhythm, QTC 472, anteroseptal infarction pattern.  Assessment/Plan Principal Problem:   Fracture of femoral neck, left, closed ([place]) Active Problems:   Rheumatoid arthritis (HCC)   HTN (hypertension), benign   Hypothyroidism   Benign meningioma of brain (HCC)   Chronic atrial fibrillation (HCC)   GERD (gastroesophageal reflux disease)   [name]  (coronary artery disease)   Fall   Lewy body dementia ([place])   Fracture of femoral neck, left, closed ([place]): As evidenced by x-ray. Patient has mild pain at rest now. No neurovascular compromise. Orthopedic surgeon,  Dr. Mack Guise was consulted --> likely do surgery on Thursday  - will admit to Med-surg bed - Pain control: morphine prn  - per her daughter pt cannot take xycodone - When necessary Zofran for nausea - Robaxin for muscle spasm - Appreciated Dr. consultation - type and cross - INR/PTT - PT/OT when able to (not ordered now)  HTN (hypertension), benign -IV hydralazine as needed -Coreg and torsemide  Hypothyroidism -Synthroid  Benign meningioma of brain ([place]) -Follow-up with neurosurgeon  Chronic atrial fibrillation ([place]) -Hold Xarelto -Continue Coreg  GERD (gastroesophageal reflux disease) -Pepcid  [name] (coronary artery disease) -Continue Coreg  Fall -PT/OT when able to  Lewy body dementia: -[place]  Rheumatoid arthritis: -Increase prednisone dose from 5 to 10 mg daily -Give 50 mg of Solu-Cortef as stress dose today   Perioperative Cardiac Risk: Patient has multiple comorbidities, including Lewy body dementia, hypertension, hyperlipidemia, GERD, hypothyroidism, rheumatoid arthritis, squamous cell cancer (thyroid, mouth and tongue), [name], diverticulosis, benign brain meningioma, [name]. Pt has history of [name], but no history of myocardial infarction per her daughter.  Patient has A. fib on Xarelto. Patient is taking torsemide 5 mg daily, but no history of CHF per her daughter.  Patient does not have leg edema.  No JVD.  Does not have any signs of CHF currently.  No 2D echo on record.  Patient is taking Coreg currently. Currently patient is active and partially dependent of his ADLs, IADLs. No recent acute cardiac issues. EKG has no acute change. At this time point, no further work up is needed. His GUPTA score perioperative myocardial  infarction or cardaic arrest is 1.86% which is moderate risk.  I discussed the risk with daughter, who would like to proceed for surgery.    DVT ppx: SCD Code Status: DNR per her daughter Family Communication:    Yes, patient's daughter at bed side Disposition Plan:  Anticipate discharge back to previous environment Consults called:  Dr. Mack Guise of  Ortho Admission status: Med-surg bed as inpt    Status is: Inpatient  Remains inpatient appropriate because:Inpatient level of care appropriate due to severity of illness   Dispo: The patient is from: Home              Anticipated d/c is to: to be determined              Anticipated d/c date is: 2 days              Patient currently is not medically stable to d/c.           Date of Service 10/20/2019    Ivor Costa [facility]   If 7PM-7AM, please contact night-coverage [email or web address] 10/20/2019, 5:09 PM

## 2019-10-20 NOTE — ED Notes (Signed)
Recollect type and screen sent to lab 

## 2019-10-20 NOTE — ED Triage Notes (Addendum)
Pt comes via EMS from [facility] with c/o left hip fracture. Pt had unwitnessed fall onto carpet yesterday at home. Pt is alert to self per EMS which is [place].  Pt on blood thinner xarelto   MD Tamala Julian at bedside

## 2019-10-20 NOTE — ED Notes (Signed)
Pharmacy messaged for missing meds 

## 2019-10-21 ENCOUNTER — Encounter: Payer: Self-pay | Admitting: Internal Medicine

## 2019-10-21 DIAGNOSIS — D696 Thrombocytopenia, unspecified: Secondary | ICD-10-CM

## 2019-10-21 DIAGNOSIS — G3183 Dementia with Lewy bodies: Secondary | ICD-10-CM

## 2019-10-21 DIAGNOSIS — F0281 Dementia in other diseases classified elsewhere with behavioral disturbance: Secondary | ICD-10-CM

## 2019-10-21 LAB — BASIC METABOLIC PANEL
Anion gap: 9 (ref 5–15)
BUN: 16 mg/dL (ref 8–23)
CO2: 27 mmol/L (ref 22–32)
Calcium: 8.6 mg/dL — ABNORMAL LOW (ref 8.9–10.3)
Chloride: 103 mmol/L (ref 98–111)
Creatinine, Ser: 0.78 mg/dL (ref 0.44–1.00)
GFR calc Af Amer: >60 mL/min (ref >60)
GFR calc non Af Amer: >60 mL/min (ref >60)
Glucose, Bld: 101 mg/dL — ABNORMAL HIGH (ref 70–99)
Potassium: 3.8 mmol/L (ref 3.5–5.1)
Sodium: 139 mmol/L (ref 135–145)

## 2019-10-21 LAB — CBC
HCT: 38.2 % (ref 36.0–46.0)
Hemoglobin: 13.2 g/dL (ref 12.0–15.0)
MCH: 31.8 pg (ref 26.0–34.0)
MCHC: 34.6 g/dL (ref 30.0–36.0)
MCV: 92 fL (ref 80.0–100.0)
Platelets: 141 10*3/uL — ABNORMAL LOW (ref 150–400)
RBC: 4.15 MIL/uL (ref 3.87–5.11)
RDW: 13.4 % (ref 11.5–15.5)
WBC: 7.5 10*3/uL (ref 4.0–10.5)
nRBC: 0 % (ref 0.0–0.2)

## 2019-10-21 MED ORDER — CHLORHEXIDINE GLUCONATE CLOTH 2 % EX PADS
6.0000 | MEDICATED_PAD | Freq: Every day | CUTANEOUS | Status: DC
  Administered 2019-10-21 – 2019-10-22 (×2): 6 via TOPICAL

## 2019-10-21 MED ORDER — SODIUM CHLORIDE 0.9 % IV SOLN: INTRAVENOUS | Status: DC

## 2019-10-21 NOTE — ED Notes (Signed)
Pt continues to be very animated, grasping and gesturing. Pt placed in mits when she started to pull on her catheter.

## 2019-10-21 NOTE — Plan of Care (Signed)
°  Problem: Education: Goal: Knowledge of General Education information will improve Description: Including pain rating scale, medication(s)/side effects and non-pharmacologic comfort measures Outcome: Progressing   Problem: Clinical Measurements: Goal: Will remain free from infection Outcome: Progressing   Problem: Education: Goal: Verbalization of understanding the information provided (i.e., activity precautions, restrictions, etc) will improve Outcome: Progressing Goal: Individualized Educational Video(s) Outcome: Progressing   Problem: Activity: Goal: Ability to ambulate and perform ADLs will improve Outcome: Progressing   Problem: Pain Management: Goal: Pain level will decrease Outcome: Progressing

## 2019-10-21 NOTE — Progress Notes (Signed)
Subjective:  Patient sleeping comfortably.  Daughter at the bedside.  Patient had foley catheter placed overnight.  Patient with agitation overnight.  Patient has hand mitts on.  Objective:   VITALS:   Vitals:   10/20/19 2216 10/21/19 0751 10/21/19 1145 10/21/19 1148  BP: (!) 148/110 (!) 161/114 106/65 106/65  Pulse: 88     Resp: 16     Temp:      SpO2: 99%     Weight:      Height:        PHYSICAL EXAM: Deferred.  Spontaneous left toe flex and extension and ankle dorsiflexion and plantarflexion observed.   LABS  Results for orders placed or performed during the hospital encounter of 10/20/19 (from the past 24 hour(s))  Protime-INR     Status: Abnormal   Collection Time: 10/20/19  3:05 PM  Result Value Ref Range   Prothrombin Time 15.3 (H) 11.4 - 15.2 seconds   INR 1.3 (H) 0.8 - 1.2  APTT     Status: Abnormal   Collection Time: 10/20/19  3:05 PM  Result Value Ref Range   aPTT 37 (H) 24 - 36 seconds  Type and screen     Status: None   Collection Time: 10/20/19  3:05 PM  Result Value Ref Range   ABO/RH(D) O POS    Antibody Screen NEG    Sample Expiration      10/23/2019,2359 Performed at [facility], [address] [place], [place] [address]   CBC     Status: Abnormal   Collection Time: 10/21/19  6:42 AM  Result Value Ref Range   WBC 7.5 4.0 - 10.5 K/uL   RBC 4.15 3.87 - 5.11 MIL/uL   Hemoglobin 13.2 12.0 - 15.0 g/dL   HCT 38.2 36 - 46 %   MCV 92.0 80.0 - 100.0 fL   MCH 31.8 26.0 - 34.0 pg   MCHC 34.6 30.0 - 36.0 g/dL   RDW 13.4 11.5 - 15.5 %   Platelets 141 (L) 150 - 400 K/uL   nRBC 0.0 0.0 - 0.2 %  Basic metabolic panel     Status: Abnormal   Collection Time: 10/21/19  6:42 AM  Result Value Ref Range   Sodium 139 135 - 145 mmol/L   Potassium 3.8 3.5 - 5.1 mmol/L   Chloride 103 98 - 111 mmol/L   CO2 27 22 - 32 mmol/L   Glucose, Bld 101 (H) 70 - 99 mg/dL   BUN 16 8 - 23 mg/dL   Creatinine, Ser 0.78 0.44 - 1.00 mg/dL   Calcium 8.6 (L) 8.9 -  10.3 mg/dL   GFR calc non Af Amer >60 >60 mL/min   GFR calc Af Amer >60 >60 mL/min   Anion gap 9 5 - 15    DG Chest 1 View  Result Date: 10/20/2019 CLINICAL DATA:  Unwitnessed fall. EXAM: CHEST  1 VIEW COMPARISON:  None. FINDINGS: Mild cardiomegaly is noted. No pneumothorax or pleural effusion is noted. Both lungs are clear. The visualized skeletal structures are unremarkable. IMPRESSION: No active disease. Electronically Signed   By: Marijo Conception M.D.   On: 10/20/2019 12:45   DG Thoracic Spine 2 View  Result Date: 10/20/2019 CLINICAL DATA:  Fall. EXAM: THORACIC SPINE 2 VIEWS COMPARISON:  No recent prior. FINDINGS: Thoracolumbar spine scoliosis and degenerative change. No acute bony abnormality identified. Mitral annular calcification noted. Biapical pleuroparenchymal thickening most consistent with scarring. Surgical clips right upper quadrant. IMPRESSION: Thoracolumbar spine scoliosis  and degenerative change. No acute abnormality identified. Electronically Signed   By: Marcello Moores  Register   On: 10/20/2019 13:11   DG Lumbar Spine 2-3 Views  Result Date: 10/20/2019 CLINICAL DATA:  Fall sustaining a left femoral neck fracture. EXAM: LUMBAR SPINE - 2-3 VIEW COMPARISON:  CT abdomen and pelvis 06/09/2014 FINDINGS: There are 5 non rib-bearing lumbar type vertebrae. There is mild-to-moderate lumbar levoscoliosis with apex at L2-3. There is no significant listhesis. No lumbar spine fracture is identified. There is moderate asymmetric disc space narrowing on the right at L2-3 and L3-4 and on the left at L4-5. Cholecystectomy clips and aortic atherosclerosis are noted. IMPRESSION: Multilevel lumbar disc degeneration without an acute osseous abnormality identified. Electronically Signed   By: Logan Bores M.D.   On: 10/20/2019 13:11   CT Head Wo Contrast  Result Date: 10/20/2019 CLINICAL DATA:  Fall sustaining a left femoral neck fracture. EXAM: CT HEAD WITHOUT CONTRAST CT CERVICAL SPINE WITHOUT CONTRAST  TECHNIQUE: Multidetector CT imaging of the head and cervical spine was performed following the standard protocol without intravenous contrast. Multiplanar CT image reconstructions of the cervical spine were also generated. COMPARISON:  Head MRI 12/02/2012.  Neck CT 05/09/2012. FINDINGS: CT HEAD FINDINGS Brain: No acute infarct, intracranial hemorrhage, midline shift, or extra-axial fluid collection is identified. Mild cerebral atrophy is within normal limits for age. Patchy hypodensities in the cerebral white matter bilaterally are nonspecific but compatible with moderate chronic small vessel ischemic disease which has likely progressed from the prior MRI. A chronic lacunar infarct in the right caudate nucleus is new. A partially calcified extra-axial mass laterally over the left cerebral convexity at the level of the operculum has enlarged and now measures 20 x 8 mm. A 13 x 7 mm extra-axial mass along the posterior aspect of the right temporal bone posterior and superior to the internal auditory canal has not significantly changed in size. Vascular: Calcified atherosclerosis at the skull base. No hyperdense vessel. Skull: No fracture or suspicious osseous lesion. Sinuses/Orbits: Visualized paranasal sinuses and mastoid air cells are clear. Visualized orbits are unremarkable. Other: None. CT CERVICAL SPINE FINDINGS Alignment: Slight reversal of the normal cervical lordosis with 2 mm anterolisthesis of C4 on C5 and 2 mm retrolisthesis of C5 on C6. Skull base and vertebrae: No acute fracture or suspicious osseous lesion. Moderate median C1-2 arthropathy. Soft tissues and spinal canal: No prevertebral fluid or swelling. No visible canal hematoma. Disc levels: Moderate disc space narrowing and degenerative endplate changes at Y6-5 and C6-7 with uncovertebral spurring resulting in mild neural foraminal stenosis. No evidence of significant spinal stenosis. Mild left facet arthrosis at C3-4 and C4-5. Upper chest: Biapical  pleuroparenchymal lung scarring. Other: Heavily calcified atherosclerotic plaque at the left greater than right carotid bifurcations. IMPRESSION: 1. No evidence of acute intracranial abnormality. 2. Moderate chronic small vessel ischemic disease. 3. Interval enlargement of left cerebral convexity meningioma. 4. Unchanged posterior fossa meningioma. 5. No evidence of acute cervical spine fracture. Electronically Signed   By: Logan Bores M.D.   On: 10/20/2019 13:22   CT Cervical Spine Wo Contrast  Result Date: 10/20/2019 CLINICAL DATA:  Fall sustaining a left femoral neck fracture. EXAM: CT HEAD WITHOUT CONTRAST CT CERVICAL SPINE WITHOUT CONTRAST TECHNIQUE: Multidetector CT imaging of the head and cervical spine was performed following the standard protocol without intravenous contrast. Multiplanar CT image reconstructions of the cervical spine were also generated. COMPARISON:  Head MRI 12/02/2012.  Neck CT 05/09/2012. FINDINGS: CT HEAD FINDINGS Brain: No acute  infarct, intracranial hemorrhage, midline shift, or extra-axial fluid collection is identified. Mild cerebral atrophy is within normal limits for age. Patchy hypodensities in the cerebral white matter bilaterally are nonspecific but compatible with moderate chronic small vessel ischemic disease which has likely progressed from the prior MRI. A chronic lacunar infarct in the right caudate nucleus is new. A partially calcified extra-axial mass laterally over the left cerebral convexity at the level of the operculum has enlarged and now measures 20 x 8 mm. A 13 x 7 mm extra-axial mass along the posterior aspect of the right temporal bone posterior and superior to the internal auditory canal has not significantly changed in size. Vascular: Calcified atherosclerosis at the skull base. No hyperdense vessel. Skull: No fracture or suspicious osseous lesion. Sinuses/Orbits: Visualized paranasal sinuses and mastoid air cells are clear. Visualized orbits are  unremarkable. Other: None. CT CERVICAL SPINE FINDINGS Alignment: Slight reversal of the normal cervical lordosis with 2 mm anterolisthesis of C4 on C5 and 2 mm retrolisthesis of C5 on C6. Skull base and vertebrae: No acute fracture or suspicious osseous lesion. Moderate median C1-2 arthropathy. Soft tissues and spinal canal: No prevertebral fluid or swelling. No visible canal hematoma. Disc levels: Moderate disc space narrowing and degenerative endplate changes at D6-3 and C6-7 with uncovertebral spurring resulting in mild neural foraminal stenosis. No evidence of significant spinal stenosis. Mild left facet arthrosis at C3-4 and C4-5. Upper chest: Biapical pleuroparenchymal lung scarring. Other: Heavily calcified atherosclerotic plaque at the left greater than right carotid bifurcations. IMPRESSION: 1. No evidence of acute intracranial abnormality. 2. Moderate chronic small vessel ischemic disease. 3. Interval enlargement of left cerebral convexity meningioma. 4. Unchanged posterior fossa meningioma. 5. No evidence of acute cervical spine fracture. Electronically Signed   By: Logan Bores M.D.   On: 10/20/2019 13:22    Assessment/Plan:     Principal Problem:   Fracture of femoral neck, left, closed ([place]) Active Problems:   Rheumatoid arthritis (HCC)   HTN (hypertension), benign   Hypothyroidism   Benign meningioma of brain (HCC)   Chronic atrial fibrillation (HCC)   GERD (gastroesophageal reflux disease)   [name] (coronary artery disease)   Fall   Lewy body dementia ([place])  Patient scheduled for Thursday for left hip hemiarthroplasty.   Need to was 3 days to operate as patient was on xarelto.  Discussed case with anesthesia.   Patient should be eligible for for spinal on Thursday.    Thornton Park , MD 10/21/2019, 12:55 PM

## 2019-10-21 NOTE — Progress Notes (Signed)
PROGRESS NOTE    Amy Hess  JXB:[id number] DOB: [birth year] DOA: 10/20/2019 PCP: Kirk Ruths, MD   Assessment & Plan:   Principal Problem:   Fracture of femoral neck, left, closed ([place]) Active Problems:   Rheumatoid arthritis ([place])   HTN (hypertension), benign   Hypothyroidism   Benign meningioma of brain ([place])   Chronic atrial fibrillation ([place])   GERD (gastroesophageal reflux disease)   [name] (coronary artery disease)   Fall   Lewy body dementia ([place])    Left fracture of femoral neck: secondary to fall. Morphine prn for pain. Will have surgery on 10/23/19 as per was on xarelto at home. Continue to hold home dose of xarelto. Ortho surg following and recs apprec   HTN: will continue on home dose of coreg, torsemide. IV hydralazine   Benign meningioma of brain: will f/u w/ neurosurgery outpatient   Chronic a.fib: continue on home dose of coreg. Hold home dose of xarelto for likely surgery   Thrombocytopenia: etiology unclear. Will continue to monitor   GERD: continue on pepcid  [name]: continue on coreg   Fall: will consult PT/OT when appropriate   Lewy body dementia: continue on home dose of namenda. No haldol as pt is unable to tolerate this medication as per pt's daughter   RA: continue on prednisone. S/p stress dose steroids in the ER x 1.  Hypothyroidism: continue on home dose of synthroid     DVT prophylaxis: SCDs Code Status: DNR Family Communication: discussing pt's care w/ pt's daughter, [name], at bedside and answered her questions  Disposition Plan: depends on PT/OT recs  Status is: Inpatient  Remains inpatient appropriate because:IV treatments appropriate due to intensity of illness or inability to take PO and Inpatient level of care appropriate due to severity of illness   Dispo: The patient is from: Home              Anticipated d/c is to: Home health vs SNF              Anticipated d/c date is: 3 days              Patient  currently is not medically stable to d/c.    Consultants:   Ortho surg   Procedures:    Antimicrobials:     Subjective: Pt is lethargic and not currently answering questions   Objective: Vitals:   10/20/19 1453 10/20/19 1745 10/20/19 2216 10/21/19 0751  BP: (!) 140/98 (!) 167/115 (!) 148/110 (!) 161/114  Pulse: 85  88   Resp: 19 15 16    Temp:      SpO2: 99%  99%   Weight:      Height:       No intake or output data in the 24 hours ending 10/21/19 0807 Filed Weights   10/20/19 1214  Weight: 45.4 kg    Examination:  General exam: Appears lethargic. Frail appearing  Respiratory system: Clear to auscultation. Respiratory effort normal. Cardiovascular system: S1 & S2 +. No rubs, gallops or clicks. No pedal edema. Gastrointestinal system: Abdomen is nondistended, soft and nontender.  Normal bowel sounds heard. Central nervous system: Appears lethargic. Moves all 4 extremities  Psychiatry: Judgement and insight appear abnormal. Flat mood and affect      Data Reviewed: I have personally reviewed following labs and imaging studies  CBC: Recent Labs  Lab 10/20/19 1218 10/21/19 0642  WBC 7.7 7.5  NEUTROABS 5.8  --   HGB 13.6 13.2  HCT 40.2  38.2  MCV 93.3 92.0  PLT 148* 382*   Basic Metabolic Panel: Recent Labs  Lab 10/20/19 1218 10/21/19 0642  NA 140 139  K 3.5 3.8  CL 102 103  CO2 28 27  GLUCOSE 109* 101*  BUN 18 16  CREATININE 0.94 0.78  CALCIUM 8.9 8.6*   GFR: Estimated Creatinine Clearance: 34.2 mL/min (by C-G formula based on SCr of 0.78 mg/dL). Liver Function Tests: Recent Labs  Lab 10/20/19 1218  AST 29  ALT 20  ALKPHOS 45  BILITOT 1.6*  PROT 6.8  ALBUMIN 4.1   No results for input(s): LIPASE, AMYLASE in the last 168 hours. No results for input(s): AMMONIA in the last 168 hours. Coagulation Profile: Recent Labs  Lab 10/20/19 1505  INR 1.3*   Cardiac Enzymes: No results for input(s): CKTOTAL, CKMB, CKMBINDEX, TROPONINI in the  last 168 hours. BNP (last 3 results) No results for input(s): PROBNP in the last 8760 hours. HbA1C: No results for input(s): HGBA1C in the last 72 hours. CBG: No results for input(s): GLUCAP in the last 168 hours. Lipid Profile: No results for input(s): CHOL, HDL, LDLCALC, TRIG, CHOLHDL, LDLDIRECT in the last 72 hours. Thyroid Function Tests: No results for input(s): TSH, T4TOTAL, FREET4, T3FREE, THYROIDAB in the last 72 hours. Anemia Panel: No results for input(s): VITAMINB12, FOLATE, FERRITIN, TIBC, IRON, RETICCTPCT in the last 72 hours. Sepsis Labs: No results for input(s): PROCALCITON, LATICACIDVEN in the last 168 hours.  Recent Results (from the past 240 hour(s))  Respiratory Panel by RT PCR (Flu A&B, Covid) - Nasopharyngeal Swab     Status: None   Collection Time: 10/20/19 12:18 PM   Specimen: Nasopharyngeal Swab  Result Value Ref Range Status   SARS Coronavirus 2 by RT PCR NEGATIVE NEGATIVE Final    Comment: (NOTE) SARS-CoV-2 target nucleic acids are NOT DETECTED.  The SARS-CoV-2 RNA is generally detectable in upper respiratoy specimens during the acute phase of infection. The lowest concentration of SARS-CoV-2 viral copies this assay can detect is 131 copies/mL. A negative result does not preclude SARS-Cov-2 infection and should not be used as the sole basis for treatment or other patient management decisions. A negative result may occur with  improper specimen collection/handling, submission of specimen other than nasopharyngeal swab, presence of viral mutation(s) within the areas targeted by this assay, and inadequate number of viral copies (<131 copies/mL). A negative result must be combined with clinical observations, patient history, and epidemiological information. The expected result is Negative.  Fact Sheet for Patients:  PinkCheek.be  Fact Sheet for Healthcare Providers:  GravelBags.it  This test is  no t yet approved or cleared by the [place] FDA and  has been authorized for detection and/or diagnosis of SARS-CoV-2 by FDA under an Emergency Use Authorization (EUA). This EUA will remain  in effect (meaning this test can be used) for the duration of the COVID-19 declaration under Section 564(b)(1) of the Act, 21 U.S.C. section 360bbb-3(b)(1), unless the authorization is terminated or revoked sooner.     Influenza A by PCR NEGATIVE NEGATIVE Final   Influenza B by PCR NEGATIVE NEGATIVE Final    Comment: (NOTE) The Xpert Xpress SARS-CoV-2/FLU/RSV assay is intended as an aid in  the diagnosis of influenza from Nasopharyngeal swab specimens and  should not be used as a sole basis for treatment. Nasal washings and  aspirates are unacceptable for Xpert Xpress SARS-CoV-2/FLU/RSV  testing.  Fact Sheet for Patients: PinkCheek.be  Fact Sheet for Healthcare Providers: GravelBags.it  This  test is not yet approved or cleared by the [place] and  has been authorized for detection and/or diagnosis of SARS-CoV-2 by  FDA under an Emergency Use Authorization (EUA). This EUA will remain  in effect (meaning this test can be used) for the duration of the  Covid-19 declaration under Section 564(b)(1) of the Act, 21  U.S.C. section 360bbb-3(b)(1), unless the authorization is  terminated or revoked. Performed at [facility], [address] [place], [place] [address]          Radiology Studies: DG Chest 1 View  Result Date: 10/20/2019 CLINICAL DATA:  Unwitnessed fall. EXAM: CHEST  1 VIEW COMPARISON:  None. FINDINGS: Mild cardiomegaly is noted. No pneumothorax or pleural effusion is noted. Both lungs are clear. The visualized skeletal structures are unremarkable. IMPRESSION: No active disease. Electronically Signed   By: Marijo Conception M.D.   On: 10/20/2019 12:45   DG Thoracic Spine 2 View  Result Date:  10/20/2019 CLINICAL DATA:  Fall. EXAM: THORACIC SPINE 2 VIEWS COMPARISON:  No recent prior. FINDINGS: Thoracolumbar spine scoliosis and degenerative change. No acute bony abnormality identified. Mitral annular calcification noted. Biapical pleuroparenchymal thickening most consistent with scarring. Surgical clips right upper quadrant. IMPRESSION: Thoracolumbar spine scoliosis and degenerative change. No acute abnormality identified. Electronically Signed   By: Marcello Moores  Register   On: 10/20/2019 13:11   DG Lumbar Spine 2-3 Views  Result Date: 10/20/2019 CLINICAL DATA:  Fall sustaining a left femoral neck fracture. EXAM: LUMBAR SPINE - 2-3 VIEW COMPARISON:  CT abdomen and pelvis 06/09/2014 FINDINGS: There are 5 non rib-bearing lumbar type vertebrae. There is mild-to-moderate lumbar levoscoliosis with apex at L2-3. There is no significant listhesis. No lumbar spine fracture is identified. There is moderate asymmetric disc space narrowing on the right at L2-3 and L3-4 and on the left at L4-5. Cholecystectomy clips and aortic atherosclerosis are noted. IMPRESSION: Multilevel lumbar disc degeneration without an acute osseous abnormality identified. Electronically Signed   By: Logan Bores M.D.   On: 10/20/2019 13:11   CT Head Wo Contrast  Result Date: 10/20/2019 CLINICAL DATA:  Fall sustaining a left femoral neck fracture. EXAM: CT HEAD WITHOUT CONTRAST CT CERVICAL SPINE WITHOUT CONTRAST TECHNIQUE: Multidetector CT imaging of the head and cervical spine was performed following the standard protocol without intravenous contrast. Multiplanar CT image reconstructions of the cervical spine were also generated. COMPARISON:  Head MRI 12/02/2012.  Neck CT 05/09/2012. FINDINGS: CT HEAD FINDINGS Brain: No acute infarct, intracranial hemorrhage, midline shift, or extra-axial fluid collection is identified. Mild cerebral atrophy is within normal limits for age. Patchy hypodensities in the cerebral white matter bilaterally are  nonspecific but compatible with moderate chronic small vessel ischemic disease which has likely progressed from the prior MRI. A chronic lacunar infarct in the right caudate nucleus is new. A partially calcified extra-axial mass laterally over the left cerebral convexity at the level of the operculum has enlarged and now measures 20 x 8 mm. A 13 x 7 mm extra-axial mass along the posterior aspect of the right temporal bone posterior and superior to the internal auditory canal has not significantly changed in size. Vascular: Calcified atherosclerosis at the skull base. No hyperdense vessel. Skull: No fracture or suspicious osseous lesion. Sinuses/Orbits: Visualized paranasal sinuses and mastoid air cells are clear. Visualized orbits are unremarkable. Other: None. CT CERVICAL SPINE FINDINGS Alignment: Slight reversal of the normal cervical lordosis with 2 mm anterolisthesis of C4 on C5 and 2 mm retrolisthesis of C5  on C6. Skull base and vertebrae: No acute fracture or suspicious osseous lesion. Moderate median C1-2 arthropathy. Soft tissues and spinal canal: No prevertebral fluid or swelling. No visible canal hematoma. Disc levels: Moderate disc space narrowing and degenerative endplate changes at P8-0 and C6-7 with uncovertebral spurring resulting in mild neural foraminal stenosis. No evidence of significant spinal stenosis. Mild left facet arthrosis at C3-4 and C4-5. Upper chest: Biapical pleuroparenchymal lung scarring. Other: Heavily calcified atherosclerotic plaque at the left greater than right carotid bifurcations. IMPRESSION: 1. No evidence of acute intracranial abnormality. 2. Moderate chronic small vessel ischemic disease. 3. Interval enlargement of left cerebral convexity meningioma. 4. Unchanged posterior fossa meningioma. 5. No evidence of acute cervical spine fracture. Electronically Signed   By: Logan Bores M.D.   On: 10/20/2019 13:22   CT Cervical Spine Wo Contrast  Result Date: 10/20/2019 CLINICAL  DATA:  Fall sustaining a left femoral neck fracture. EXAM: CT HEAD WITHOUT CONTRAST CT CERVICAL SPINE WITHOUT CONTRAST TECHNIQUE: Multidetector CT imaging of the head and cervical spine was performed following the standard protocol without intravenous contrast. Multiplanar CT image reconstructions of the cervical spine were also generated. COMPARISON:  Head MRI 12/02/2012.  Neck CT 05/09/2012. FINDINGS: CT HEAD FINDINGS Brain: No acute infarct, intracranial hemorrhage, midline shift, or extra-axial fluid collection is identified. Mild cerebral atrophy is within normal limits for age. Patchy hypodensities in the cerebral white matter bilaterally are nonspecific but compatible with moderate chronic small vessel ischemic disease which has likely progressed from the prior MRI. A chronic lacunar infarct in the right caudate nucleus is new. A partially calcified extra-axial mass laterally over the left cerebral convexity at the level of the operculum has enlarged and now measures 20 x 8 mm. A 13 x 7 mm extra-axial mass along the posterior aspect of the right temporal bone posterior and superior to the internal auditory canal has not significantly changed in size. Vascular: Calcified atherosclerosis at the skull base. No hyperdense vessel. Skull: No fracture or suspicious osseous lesion. Sinuses/Orbits: Visualized paranasal sinuses and mastoid air cells are clear. Visualized orbits are unremarkable. Other: None. CT CERVICAL SPINE FINDINGS Alignment: Slight reversal of the normal cervical lordosis with 2 mm anterolisthesis of C4 on C5 and 2 mm retrolisthesis of C5 on C6. Skull base and vertebrae: No acute fracture or suspicious osseous lesion. Moderate median C1-2 arthropathy. Soft tissues and spinal canal: No prevertebral fluid or swelling. No visible canal hematoma. Disc levels: Moderate disc space narrowing and degenerative endplate changes at D9-8 and C6-7 with uncovertebral spurring resulting in mild neural foraminal  stenosis. No evidence of significant spinal stenosis. Mild left facet arthrosis at C3-4 and C4-5. Upper chest: Biapical pleuroparenchymal lung scarring. Other: Heavily calcified atherosclerotic plaque at the left greater than right carotid bifurcations. IMPRESSION: 1. No evidence of acute intracranial abnormality. 2. Moderate chronic small vessel ischemic disease. 3. Interval enlargement of left cerebral convexity meningioma. 4. Unchanged posterior fossa meningioma. 5. No evidence of acute cervical spine fracture. Electronically Signed   By: Logan Bores M.D.   On: 10/20/2019 13:22        Scheduled Meds: . calcium-vitamin D  1 tablet Oral Daily  . carvedilol  3.125 mg Oral BID  . famotidine  20 mg Oral QHS  . folic acid  1 mg Oral Daily  . levothyroxine  75 mcg Oral QAC breakfast  . loratadine  10 mg Oral Daily  . melatonin  2.5 mg Oral QHS  . memantine  10 mg Oral  BID  . multivitamin with minerals  1 tablet Oral Daily  . predniSONE  10 mg Oral Q breakfast  . QUEtiapine  25 mg Oral BID  . torsemide  5 mg Oral Daily   Continuous Infusions:   LOS: 1 day    Time spent: 35 mins     Wyvonnia Dusky, MD [facility] Pager [phone]  If 7PM-7AM, please contact night-coverage [email or web address] 10/21/2019, 8:07 AM

## 2019-10-21 NOTE — ED Notes (Signed)
Attempted to give report to unit 1A, unit secretary Manuela Schwartz said they will call back when the patient has been assigned to nurse

## 2019-10-21 NOTE — ED Notes (Signed)
Surgeon speaking with patient and daughter

## 2019-10-21 NOTE — ED Notes (Signed)
Discussed pt agitation with her daughter. Pt stated that her mother has lewy body dementia but her current state was not her baseline. She stated that the patient does not do well with meds like ativan and haldol has caused significant agitation in the past. She asked that those medications not be used.

## 2019-10-21 NOTE — TOC Initial Note (Signed)
Transition of Care [facility]) - Progression Note    Patient Details  Name: Amy Hess MRN: [id number] Date of Birth: [birth year]  Transition of Care [facility]) CM/SW [place], [place] Phone Number: [phone] 10/21/2019, 12:03 PM  Clinical Narrative:   **SNF placement is not an option for family. Patietn will be returning home with home health.**    CSW spoke with patient's Hess,Amy (Daughter) [phone]. [name] stated the goal is for the patient to return home with home health.  [name] stated the patient a daily care giver and has active supports.  [name] stated she assists the patient with her ADLs and she takes the patient to doctor's appointments and assist her with her medications. [name] stated she would like some information on Medicaid and PACE to compare the benefits of the two.  The patient's current monthly income is about 1,300.  This CSW stated she would do some reearch on the two and speak with [name] when she returns to the hospital.  [name] verbalized understanding.  [name] stated she would find out the name of the home health agency which they used last time, so CSW could contact them for home health for this d/c.   Expected Discharge Plan: [place] Barriers to Discharge: Continued Medical Work up  Expected Discharge Plan and Services Expected Discharge Plan: [place] In-house Referral: Clinical Social Work   Post Acute Care Choice: [place] arrangements for the past 2 months: Single Family Home                                       Social Determinants of Health (SDOH) Interventions    Readmission Risk Interventions No flowsheet data found.

## 2019-10-22 DIAGNOSIS — W19XXXS Unspecified fall, sequela: Secondary | ICD-10-CM

## 2019-10-22 DIAGNOSIS — F0280 Dementia in other diseases classified elsewhere without behavioral disturbance: Secondary | ICD-10-CM

## 2019-10-22 LAB — CBC
HCT: 37.5 % (ref 36.0–46.0)
Hemoglobin: 12.4 g/dL (ref 12.0–15.0)
MCH: 31.5 pg (ref 26.0–34.0)
MCHC: 33.1 g/dL (ref 30.0–36.0)
MCV: 95.2 fL (ref 80.0–100.0)
Platelets: 149 10*3/uL — ABNORMAL LOW (ref 150–400)
RBC: 3.94 MIL/uL (ref 3.87–5.11)
RDW: 13.5 % (ref 11.5–15.5)
WBC: 7.1 10*3/uL (ref 4.0–10.5)
nRBC: 0 % (ref 0.0–0.2)

## 2019-10-22 LAB — BASIC METABOLIC PANEL
Anion gap: 11 (ref 5–15)
BUN: 20 mg/dL (ref 8–23)
CO2: 23 mmol/L (ref 22–32)
Calcium: 8.2 mg/dL — ABNORMAL LOW (ref 8.9–10.3)
Chloride: 107 mmol/L (ref 98–111)
Creatinine, Ser: 1.09 mg/dL — ABNORMAL HIGH (ref 0.44–1.00)
GFR calc Af Amer: 52 mL/min — ABNORMAL LOW (ref >60)
GFR calc non Af Amer: 45 mL/min — ABNORMAL LOW (ref >60)
Glucose, Bld: 80 mg/dL (ref 70–99)
Potassium: 3.6 mmol/L (ref 3.5–5.1)
Sodium: 141 mmol/L (ref 135–145)

## 2019-10-22 MED ORDER — AMLODIPINE BESYLATE 10 MG PO TABS
10.0000 mg | ORAL_TABLET | Freq: Every day | ORAL | Status: DC
  Administered 2019-10-22 – 2019-10-26 (×4): 10 mg via ORAL
  Filled 2019-10-22 (×5): qty 1

## 2019-10-22 MED ORDER — DICLOFENAC SODIUM 1 % EX GEL
2.0000 g | Freq: Three times a day (TID) | CUTANEOUS | Status: DC | PRN
  Filled 2019-10-22: qty 100

## 2019-10-22 MED ORDER — ACETAMINOPHEN 325 MG PO TABS
650.0000 mg | ORAL_TABLET | Freq: Four times a day (QID) | ORAL | Status: DC | PRN
  Administered 2019-10-22 – 2019-10-26 (×9): 650 mg via ORAL
  Filled 2019-10-22 (×9): qty 2

## 2019-10-22 MED ORDER — CEFAZOLIN SODIUM-DEXTROSE 2-4 GM/100ML-% IV SOLN
2.0000 g | INTRAVENOUS | Status: AC
  Administered 2019-10-23: 2 g via INTRAVENOUS
  Filled 2019-10-22: qty 100

## 2019-10-22 NOTE — Plan of Care (Signed)
  Problem: Education: Goal: Knowledge of General Education information will improve Description: Including pain rating scale, medication(s)/side effects and non-pharmacologic comfort measures Outcome: Progressing   Problem: Clinical Measurements: Goal: Will remain free from infection Outcome: Progressing   Problem: Education: Goal: Verbalization of understanding the information provided (i.e., activity precautions, restrictions, etc) will improve Outcome: Progressing Goal: Individualized Educational Video(s) Outcome: Progressing   Problem: Activity: Goal: Ability to ambulate and perform ADLs will improve Outcome: Progressing   Problem: Pain Management: Goal: Pain level will decrease Outcome: Progressing

## 2019-10-22 NOTE — Progress Notes (Signed)
Initial Nutrition Assessment  DOCUMENTATION CODES:   Underweight  INTERVENTION:  Ensure Enlive po daily, each supplement provides 350 kcal and 20 grams of protein (vanilla)  Magic cup TID with meals, each supplement provides 290 kcal and 9 grams of protein (orange cream;vanilla)  MVI with minerals daily  NUTRITION DIAGNOSIS:   Increased nutrient needs related to post-op healing, hip fracture as evidenced by estimated needs.   GOAL:   Patient will meet greater than or equal to 90% of their needs    MONITOR:   PO intake, Supplement acceptance, Weight trends, Diet advancement, Skin, Labs, I & O's  REASON FOR ASSESSMENT:   Consult Assessment of nutrition requirement/status  ASSESSMENT:  86 year old female with history significant of Lewy body dementia, HTN, HLD, GERD, hypothyroidism, RA, squamous cell cancer, [name], diverticulitis, benign brain meningioma, [name] presents with left hip pain after a mechanical fall at home.  Plans for left hemiarthroplasty tomorrow.   Patient sitting up in bed eating lunch with assistance from home health nurse this afternoon. Patient pleasantly confused and unable to provide any history. Home Health nurse reports pt has a good appetite and eats well at home. She can feed herself at baseline, nurse reports providing assistance today during visit. Patient continued to eat during visit, reported feeling full after ~50% po of lunch meal, noted 50% of breakfast per flowsheets. Usually pt eats 3 meals and drinks Ensure daily, daughter of pt on speaker phone reports she likes vanilla. Pt is at risk for malnutrition due to poor po intake of meals, advanced age, dementia, as well as recent hip fracture pending surgical intervention. Will order Ensure Enlive daily as well as provide Magic Cup on lunch and dinner trays to aid with meeting her needs.  Per chart review, weights have been stable over the past 6 months. Currently pt weighs  45.4 kg (99.88 lbs) and is underweight.   03/27/19- 48.2 kg 05/05/19- 48.9 kg 05/22/19- 46.7 kg 08/07/19- 46.7 kg 09/22/19- 46.2 kg   Medications reviewed and include: Oscal with D, Coreg, Pepcid, Folic acid, MVI, Namenda, Prednisone, Seroquel, Melatonin, Demadex  IVF: NaCl @ 75 ml/hr Labs reviewed   NUTRITION - FOCUSED PHYSICAL EXAM: Unable to perform at this time, pt eating lunch meal with assistance of home health nurse this afternoon.   Diet Order:   Diet Order            Diet Heart Room service appropriate? Yes; Fluid consistency: Thin  Diet effective now                 EDUCATION NEEDS:   Education needs have been addressed  Skin:  Skin Assessment: Reviewed RN Assessment  Last BM:  9/27  Height:   Ht Readings from Last 1 Encounters:  10/20/19 5\' 2"  (1.575 m)    Weight:   Wt Readings from Last 1 Encounters:  10/20/19 45.4 kg    Ideal Body Weight:  50 kg  BMI:  Body mass index is 18.29 kg/m.  Estimated Nutritional Needs:   Kcal:  1400-1600  Protein:  60-70  Fluid:  >1.2 L/day   Lajuan Lines, RD, LDN Clinical Nutrition After Hours/Weekend Pager # in [place]

## 2019-10-22 NOTE — Progress Notes (Signed)
   10/21/19 2336  Assess: MEWS Score  Temp 97.6 F (36.4 C)  BP (!) 182/126  Pulse Rate (!) 111  Resp 17  SpO2 100 %  O2 Device Room Air  Assess: MEWS Score  MEWS Temp 0  MEWS Systolic 0  MEWS Pulse 2  MEWS RR 0  MEWS LOC 0  MEWS Score 2  MEWS Score Color Yellow  Assess: if the MEWS score is Yellow or Red  Were vital signs taken at a resting state? Yes  Focused Assessment Change from prior assessment (see assessment flowsheet)  Early Detection of Sepsis Score *See Row Information* Low  MEWS guidelines implemented *See Row Information* Yes  Treat  MEWS Interventions Administered prn meds/treatments  Pain Scale PAINAD  Pain Intervention(s) Medication (See eMAR);Cold applied  Breathing 1  Negative Vocalization 1  Facial Expression 2  Body Language 2  Consolability 1  PAINAD Score 7  Patients response to intervention Effective  Take Vital Signs  Increase Vital Sign Frequency  Yellow: Q 2hr X 2 then Q 4hr X 2, if remains yellow, continue Q 4hrs  Escalate  MEWS: Escalate Yellow: discuss with charge nurse/RN and consider discussing with provider and RRT  Notify: Charge Nurse/RN  Name of Charge Nurse/RN Notified Christena Deem  Date Charge Nurse/RN Notified 10/21/19  Time Charge Nurse/RN Notified 2040  Notify: Provider  Provider Name/Title Posey Pronto   Date Provider Notified 10/21/19  Notification Type Page  Notification Reason Change in status  Response See new orders  Date of Provider Response 10/22/19  Document  Patient Outcome Stabilized after interventions

## 2019-10-22 NOTE — Progress Notes (Signed)
   10/22/19 0413  Assess: MEWS Score  Temp 97.8 F (36.6 C)  BP (!) 195/118  Pulse Rate 85  Resp 16  SpO2 92 %  O2 Device Room Air  Assess: MEWS Score  MEWS Temp 0  MEWS Systolic 0  MEWS Pulse 0  MEWS RR 0  MEWS LOC 0  MEWS Score 0  MEWS Score Color Green  Assess: if the MEWS score is Yellow or Red  Were vital signs taken at a resting state? Yes  Focused Assessment No change from prior assessment  Early Detection of Sepsis Score *See Row Information* Low  MEWS guidelines implemented *See Row Information* Yes  Treat  MEWS Interventions Administered prn meds/treatments  Pain Scale PAINAD  Pain Score Asleep  Breathing 0  Negative Vocalization 0  Facial Expression 0  Body Language 0  Consolability 0  PAINAD Score 0  Patients response to intervention Effective  Take Vital Signs  Increase Vital Sign Frequency  Yellow: Q 2hr X 2 then Q 4hr X 2, if remains yellow, continue Q 4hrs  Escalate  MEWS: Escalate Yellow: discuss with charge nurse/RN and consider discussing with provider and RRT  Notify: Charge Nurse/RN  Name of Charge Nurse/RN Notified Sunday Spillers  Date Charge Nurse/RN Notified 10/22/19  Time Charge Nurse/RN Notified 0431  Notify: Provider  Provider Name/Title Posey Pronto  Date Provider Notified 10/22/19  Time Provider Notified [phone]  Notification Type Page  Notification Reason Other (Comment) (elevated BP and HR)  Response No new orders  Date of Provider Response 10/22/19  Time of Provider Response 0536  Document  Patient Outcome Stabilized after interventions

## 2019-10-22 NOTE — Progress Notes (Signed)
PROGRESS NOTE    Amy Hess  KGY:[id number] DOB: June 28, 1928 DOA: 10/20/2019 PCP: Kirk Ruths, MD    Brief Narrative:  Amy Hess is a 86 y.o. female with medical history significant of Lewy body dementia, hypertension, hyperlipidemia, GERD, hypothyroidism, rheumatoid arthritis, squamous cell cancer (thyroid, mouth and tongue), [name], diverticulosis, benign brain meningioma, [name], who presents with fall and left hip pain.    Consultants:   orthopedics  Procedures:   Antimicrobials:       Subjective: Comfortable, denies pain. Has no complaints. Daughter at bedise.  Objective: Vitals:   10/22/19 0542 10/22/19 0556 10/22/19 0808 10/22/19 1531  BP: (!) 174/108 (!) 165/89 (!) 149/93 114/79  Pulse: 90 84 100 88  Resp:   18 16  Temp:   98.2 F (36.8 C)   TempSrc:   Oral   SpO2:   100% 100%  Weight:      Height:        Intake/Output Summary (Last 24 hours) at 10/22/2019 2133 Last data filed at 10/22/2019 1856 Gross per 24 hour  Intake 1677.5 ml  Output 300 ml  Net 1377.5 ml   Filed Weights   10/20/19 1214  Weight: 45.4 kg    Examination:  General exam: Appears calm and comfortable  Respiratory system: Clear to auscultation. Respiratory effort normal. Cardiovascular system: S1 & S2 heard, RRR. No JVD, murmurs, rubs, gallops or clicks. No pedal edema. Gastrointestinal system: Abdomen is nondistended, soft and nontender. No organomegaly or masses felt. Normal bowel sounds heard. Central nervous system: sleepy but responds to my questions Extremities: no edema Skin: warm, dry Psychiatry: Judgement and insight appear normal. Mood & affect appropriate.     Data Reviewed: I have personally reviewed following labs and imaging studies  CBC: Recent Labs  Lab 10/20/19 1218 10/21/19 0642 10/22/19 0543  WBC 7.7 7.5 7.1  NEUTROABS 5.8  --   --   HGB 13.6 13.2 12.4  HCT 40.2 38.2 37.5  MCV 93.3 92.0 95.2  PLT 148*  141* 026*   Basic Metabolic Panel: Recent Labs  Lab 10/20/19 1218 10/21/19 0642 10/22/19 0543  NA 140 139 141  K 3.5 3.8 3.6  CL 102 103 107  CO2 28 27 23   GLUCOSE 109* 101* 80  BUN 18 16 20   CREATININE 0.94 0.78 1.09*  CALCIUM 8.9 8.6* 8.2*   GFR: Estimated Creatinine Clearance: 25.1 mL/min (A) (by C-G formula based on SCr of 1.09 mg/dL (H)). Liver Function Tests: Recent Labs  Lab 10/20/19 1218  AST 29  ALT 20  ALKPHOS 45  BILITOT 1.6*  PROT 6.8  ALBUMIN 4.1   No results for input(s): LIPASE, AMYLASE in the last 168 hours. No results for input(s): AMMONIA in the last 168 hours. Coagulation Profile: Recent Labs  Lab 10/20/19 1505  INR 1.3*   Cardiac Enzymes: No results for input(s): CKTOTAL, CKMB, CKMBINDEX, TROPONINI in the last 168 hours. BNP (last 3 results) No results for input(s): PROBNP in the last 8760 hours. HbA1C: No results for input(s): HGBA1C in the last 72 hours. CBG: No results for input(s): GLUCAP in the last 168 hours. Lipid Profile: No results for input(s): CHOL, HDL, LDLCALC, TRIG, CHOLHDL, LDLDIRECT in the last 72 hours. Thyroid Function Tests: No results for input(s): TSH, T4TOTAL, FREET4, T3FREE, THYROIDAB in the last 72 hours. Anemia Panel: No results for input(s): VITAMINB12, FOLATE, FERRITIN, TIBC, IRON, RETICCTPCT in the last 72 hours. Sepsis Labs: No results for input(s): PROCALCITON, LATICACIDVEN in  the last 168 hours.  Recent Results (from the past 240 hour(s))  Respiratory Panel by RT PCR (Flu A&B, Covid) - Nasopharyngeal Swab     Status: None   Collection Time: 10/20/19 12:18 PM   Specimen: Nasopharyngeal Swab  Result Value Ref Range Status   SARS Coronavirus 2 by RT PCR NEGATIVE NEGATIVE Final    Comment: (NOTE) SARS-CoV-2 target nucleic acids are NOT DETECTED.  The SARS-CoV-2 RNA is generally detectable in upper respiratoy specimens during the acute phase of infection. The lowest concentration of SARS-CoV-2 viral copies  this assay can detect is 131 copies/mL. A negative result does not preclude SARS-Cov-2 infection and should not be used as the sole basis for treatment or other patient management decisions. A negative result may occur with  improper specimen collection/handling, submission of specimen other than nasopharyngeal swab, presence of viral mutation(s) within the areas targeted by this assay, and inadequate number of viral copies (<131 copies/mL). A negative result must be combined with clinical observations, patient history, and epidemiological information. The expected result is Negative.  Fact Sheet for Patients:  PinkCheek.be  Fact Sheet for Healthcare Providers:  GravelBags.it  This test is no t yet approved or cleared by the [place] FDA and  has been authorized for detection and/or diagnosis of SARS-CoV-2 by FDA under an Emergency Use Authorization (EUA). This EUA will remain  in effect (meaning this test can be used) for the duration of the COVID-19 declaration under Section 564(b)(1) of the Act, 21 U.S.C. section 360bbb-3(b)(1), unless the authorization is terminated or revoked sooner.     Influenza A by PCR NEGATIVE NEGATIVE Final   Influenza B by PCR NEGATIVE NEGATIVE Final    Comment: (NOTE) The Xpert Xpress SARS-CoV-2/FLU/RSV assay is intended as an aid in  the diagnosis of influenza from Nasopharyngeal swab specimens and  should not be used as a sole basis for treatment. Nasal washings and  aspirates are unacceptable for Xpert Xpress SARS-CoV-2/FLU/RSV  testing.  Fact Sheet for Patients: PinkCheek.be  Fact Sheet for Healthcare Providers: GravelBags.it  This test is not yet approved or cleared by the [place] FDA and  has been authorized for detection and/or diagnosis of SARS-CoV-2 by  FDA under an Emergency Use Authorization (EUA). This EUA  will remain  in effect (meaning this test can be used) for the duration of the  Covid-19 declaration under Section 564(b)(1) of the Act, 21  U.S.C. section 360bbb-3(b)(1), unless the authorization is  terminated or revoked. Performed at [facility], [address] [place], [place] [address]          Radiology Studies: No results found.      Scheduled Meds: . amLODipine  10 mg Oral Daily  . calcium-vitamin D  1 tablet Oral Daily  . carvedilol  3.125 mg Oral BID  . Chlorhexidine Gluconate Cloth  6 each Topical Daily  . famotidine  20 mg Oral QHS  . folic acid  1 mg Oral Daily  . levothyroxine  75 mcg Oral QAC breakfast  . loratadine  10 mg Oral Daily  . melatonin  2.5 mg Oral QHS  . memantine  10 mg Oral BID  . multivitamin with minerals  1 tablet Oral Daily  . predniSONE  10 mg Oral Q breakfast  . QUEtiapine  25 mg Oral BID  . torsemide  5 mg Oral Daily   Continuous Infusions: . sodium chloride 75 mL/hr at 10/22/19 1447  .  ceFAZolin (ANCEF) IV  Assessment & Plan:   Principal Problem:   Fracture of femoral neck, left, closed ([place]) Active Problems:   Rheumatoid arthritis (HCC)   HTN (hypertension), benign   Hypothyroidism   Benign meningioma of brain ([place])   Chronic atrial fibrillation (HCC)   GERD (gastroesophageal reflux disease)   [name] (coronary artery disease)   Fall   Lewy body dementia ([place])     Fracture of femoral neck, left, closed ([place]):  Orthopedics on board, plan for or tomorrow , waiting for Xarelto washout. Pain mx   HTN (hypertension), benign Stable.  Continue iv hydralazine prn Continue coreg and torsemide   Hypothyroidism Continue synthroid  Benign meningioma of brain ([place]) F/u with neurosurgery  As outpt  Chronic atrial fibrillation ([place]) -continue with coreg Hold xarelto for planned surgery  GERD (gastroesophageal reflux disease) -Pepcid  [name] (coronary artery disease) -Continue  Coreg  Fall -PT/OT when able to  Lewy body dementia: -Namenda  Rheumatoid arthritis: -prednisone increased from 5 to 10 daily. -was given stress dose steroid   DVT prophylaxis: SCD Code Status:full Family Communication: daughter at bedside  Status is: Inpatient  Remains inpatient appropriate because:Ongoing diagnostic testing needed not appropriate for outpatient work up   Dispo: The patient is from: Home              Anticipated d/c is to: home v.s snf              Anticipated d/c date is: 2 days              Patient currently is not medically stable to d/c.plan for OR in am.             LOS: 2 days   Time spent: 35 min with >50% on coc    Nolberto Hanlon, MD [facility] Pager [phone]  If 7PM-7AM, please contact night-coverage [email or web address] Password [facility] 10/22/2019, 9:33 PM

## 2019-10-23 ENCOUNTER — Encounter
Admission: EM | Disposition: A | Discharge status: Home Health | Payer: Self-pay | Source: Home / Self Care | Attending: Internal Medicine

## 2019-10-23 ENCOUNTER — Inpatient Hospital Stay: Payer: Medicare Other | Admitting: Registered Nurse

## 2019-10-23 ENCOUNTER — Inpatient Hospital Stay: Payer: Medicare Other

## 2019-10-23 ENCOUNTER — Encounter: Payer: Self-pay | Admitting: Orthopedic Surgery

## 2019-10-23 DIAGNOSIS — I2510 Atherosclerotic heart disease of native coronary artery without angina pectoris: Secondary | ICD-10-CM

## 2019-10-23 HISTORY — PX: HIP ARTHROPLASTY: SHX981

## 2019-10-23 SURGERY — HEMIARTHROPLASTY, HIP, DIRECT ANTERIOR APPROACH, FOR FRACTURE
Anesthesia: Regional | Site: Hip | Laterality: Left

## 2019-10-23 MED ORDER — RIVAROXABAN 20 MG PO TABS
20.0000 mg | ORAL_TABLET | Freq: Every day | ORAL | Status: DC
  Filled 2019-10-23: qty 1

## 2019-10-23 MED ORDER — RIVAROXABAN 15 MG PO TABS
15.0000 mg | ORAL_TABLET | Freq: Every day | ORAL | Status: DC
  Administered 2019-10-24 – 2019-10-25 (×2): 15 mg via ORAL
  Filled 2019-10-23 (×3): qty 1

## 2019-10-23 MED ORDER — NEOMYCIN-POLYMYXIN B GU 40-200000 IR SOLN
Status: DC | PRN
  Administered 2019-10-23: 16 mL

## 2019-10-23 MED ORDER — MORPHINE SULFATE (PF) 2 MG/ML IV SOLN
0.5000 mg | INTRAVENOUS | Status: DC | PRN
  Administered 2019-10-23 (×2): 0.5 mg via INTRAVENOUS
  Filled 2019-10-23 (×2): qty 1

## 2019-10-23 MED ORDER — CEFAZOLIN SODIUM-DEXTROSE 1-4 GM/50ML-% IV SOLN
1.0000 g | Freq: Four times a day (QID) | INTRAVENOUS | Status: AC
  Administered 2019-10-23: 1 g via INTRAVENOUS
  Filled 2019-10-23 (×3): qty 50

## 2019-10-23 MED ORDER — PHENYLEPHRINE HCL (PRESSORS) 10 MG/ML IV SOLN
INTRAVENOUS | Status: AC
  Filled 2019-10-23: qty 1

## 2019-10-23 MED ORDER — ONDANSETRON HCL 4 MG PO TABS: 4.0000 mg | ORAL_TABLET | Freq: Four times a day (QID) | ORAL | Status: DC | PRN

## 2019-10-23 MED ORDER — SENNA 8.6 MG PO TABS
1.0000 | ORAL_TABLET | Freq: Two times a day (BID) | ORAL | Status: DC
  Administered 2019-10-23 – 2019-10-26 (×7): 8.6 mg via ORAL
  Filled 2019-10-23 (×8): qty 1

## 2019-10-23 MED ORDER — DOCUSATE SODIUM 100 MG PO CAPS
100.0000 mg | ORAL_CAPSULE | Freq: Two times a day (BID) | ORAL | Status: DC
  Administered 2019-10-23 – 2019-10-25 (×5): 100 mg via ORAL
  Filled 2019-10-23 (×7): qty 1

## 2019-10-23 MED ORDER — FENTANYL CITRATE (PF) 100 MCG/2ML IJ SOLN: 25.0000 ug | INTRAMUSCULAR | Status: DC | PRN

## 2019-10-23 MED ORDER — ONDANSETRON HCL 4 MG/2ML IJ SOLN: 4.0000 mg | Freq: Once | INTRAMUSCULAR | Status: DC | PRN

## 2019-10-23 MED ORDER — SODIUM CHLORIDE 0.9 % IV SOLN
INTRAVENOUS | Status: DC | PRN
  Administered 2019-10-23: 40 ug/min via INTRAVENOUS

## 2019-10-23 MED ORDER — HYDROCODONE-ACETAMINOPHEN 5-325 MG PO TABS: 1.0000 | ORAL_TABLET | ORAL | Status: DC | PRN

## 2019-10-23 MED ORDER — FENTANYL CITRATE (PF) 100 MCG/2ML IJ SOLN
INTRAMUSCULAR | Status: DC | PRN
Start: 2019-10-23 — End: 2019-10-23
  Administered 2019-10-23: 25 ug via INTRAVENOUS

## 2019-10-23 MED ORDER — NEOMYCIN-POLYMYXIN B GU 40-200000 IR SOLN
Status: AC
  Filled 2019-10-23: qty 20

## 2019-10-23 MED ORDER — POTASSIUM CHLORIDE IN NACL 20-0.45 MEQ/L-% IV SOLN
INTRAVENOUS | Status: DC
  Filled 2019-10-23 (×5): qty 1000

## 2019-10-23 MED ORDER — ONDANSETRON HCL 4 MG/2ML IJ SOLN: 4.0000 mg | Freq: Four times a day (QID) | INTRAMUSCULAR | Status: DC | PRN

## 2019-10-23 MED ORDER — TRAMADOL HCL 50 MG PO TABS
50.0000 mg | ORAL_TABLET | Freq: Four times a day (QID) | ORAL | Status: DC
  Administered 2019-10-24 (×2): 50 mg via ORAL
  Filled 2019-10-23 (×3): qty 1

## 2019-10-23 MED ORDER — FENTANYL CITRATE (PF) 100 MCG/2ML IJ SOLN
INTRAMUSCULAR | Status: AC
  Filled 2019-10-23: qty 2

## 2019-10-23 MED ORDER — ALUM & MAG HYDROXIDE-SIMETH 200-200-20 MG/5ML PO SUSP: 30.0000 mL | ORAL | Status: DC | PRN

## 2019-10-23 MED ORDER — BISACODYL 10 MG RE SUPP
10.0000 mg | Freq: Every day | RECTAL | Status: DC | PRN
  Filled 2019-10-23: qty 1

## 2019-10-23 MED ORDER — PHENYLEPHRINE HCL (PRESSORS) 10 MG/ML IV SOLN
INTRAVENOUS | Status: DC | PRN
  Administered 2019-10-23 (×2): 100 ug via INTRAVENOUS

## 2019-10-23 MED ORDER — MAGNESIUM CITRATE PO SOLN
1.0000 | Freq: Once | ORAL | Status: DC | PRN
  Filled 2019-10-23: qty 296

## 2019-10-23 MED ORDER — BUPIVACAINE HCL (PF) 0.5 % IJ SOLN
INTRAMUSCULAR | Status: AC
  Filled 2019-10-23: qty 10

## 2019-10-23 MED ORDER — PROPOFOL 10 MG/ML IV BOLUS
INTRAVENOUS | Status: DC | PRN
  Administered 2019-10-23: 10 mg via INTRAVENOUS
  Administered 2019-10-23 (×3): 20 mg via INTRAVENOUS

## 2019-10-23 MED ORDER — BUPIVACAINE HCL (PF) 0.5 % IJ SOLN
INTRAMUSCULAR | Status: DC | PRN
  Administered 2019-10-23: 3 mL

## 2019-10-23 MED ORDER — POLYETHYLENE GLYCOL 3350 17 G PO PACK
17.0000 g | PACK | Freq: Every day | ORAL | Status: DC | PRN
  Filled 2019-10-23: qty 1

## 2019-10-23 MED ORDER — PROPOFOL 500 MG/50ML IV EMUL
INTRAVENOUS | Status: AC
  Filled 2019-10-23: qty 50

## 2019-10-23 MED ORDER — PROPOFOL 500 MG/50ML IV EMUL
INTRAVENOUS | Status: DC | PRN
  Administered 2019-10-23: 40 ug/kg/min via INTRAVENOUS

## 2019-10-23 SURGICAL SUPPLY — 59 items
BLADE SAGITTAL WIDE XTHICK NO (BLADE) ×3 IMPLANT
BLADE SURG SZ10 CARB STEEL (BLADE) ×3 IMPLANT
BNDG COHESIVE 4X5 TAN STRL (GAUZE/BANDAGES/DRESSINGS) ×3 IMPLANT
CANISTER SUCT 1200ML W/VALVE (MISCELLANEOUS) IMPLANT
CANISTER SUCT 3000ML PPV (MISCELLANEOUS) IMPLANT
COVER BACK TABLE REUSABLE LG (DRAPES) ×3 IMPLANT
COVER WAND RF STERILE (DRAPES) ×3 IMPLANT
DRAPE 3/4 80X56 (DRAPES) ×6 IMPLANT
DRAPE IMP U-DRAPE 54X76 (DRAPES) ×3 IMPLANT
DRAPE INCISE IOBAN 66X60 STRL (DRAPES) ×3 IMPLANT
DRAPE SPLIT 6X30 W/TAPE (DRAPES) ×6 IMPLANT
DRAPE SURG 17X11 SM STRL (DRAPES) ×3 IMPLANT
DRSG OPSITE POSTOP 4X10 (GAUZE/BANDAGES/DRESSINGS) ×3 IMPLANT
DRSG OPSITE POSTOP 4X8 (GAUZE/BANDAGES/DRESSINGS) ×3 IMPLANT
DURAPREP 26ML APPLICATOR (WOUND CARE) ×6 IMPLANT
ELECT BLADE 6.5 EXT (BLADE) ×3 IMPLANT
ELECT CAUTERY BLADE 6.4 (BLADE) ×3 IMPLANT
ELECT REM PT RETURN 9FT ADLT (ELECTROSURGICAL) ×3
ELECTRODE REM PT RTRN 9FT ADLT (ELECTROSURGICAL) ×1 IMPLANT
GAUZE SPONGE 4X4 12PLY STRL (GAUZE/BANDAGES/DRESSINGS) IMPLANT
GAUZE XEROFORM 1X8 LF (GAUZE/BANDAGES/DRESSINGS) ×3 IMPLANT
GLOVE BIOGEL PI IND STRL 9 (GLOVE) ×1 IMPLANT
GLOVE BIOGEL PI INDICATOR 9 (GLOVE) ×2
GLOVE SURG 9.0 ORTHO LTXF (GLOVE) ×6 IMPLANT
GOWN STRL REUS TWL 2XL XL LVL4 (GOWN DISPOSABLE) ×3 IMPLANT
GOWN STRL REUS W/ TWL LRG LVL3 (GOWN DISPOSABLE) ×1 IMPLANT
GOWN STRL REUS W/TWL LRG LVL3 (GOWN DISPOSABLE) ×2
HEAD MODULAR ENDO (Orthopedic Implant) ×2 IMPLANT
HEAD UNPLR 46XMDLR STRL HIP (Orthopedic Implant) ×1 IMPLANT
HEMOVAC 400ML (MISCELLANEOUS) ×3
KIT DRAIN HEMOVAC JP 7FR 400ML (MISCELLANEOUS) ×1 IMPLANT
KIT TURNOVER KIT A (KITS) ×3 IMPLANT
MANIFOLD NEPTUNE II (INSTRUMENTS) ×3 IMPLANT
NDL SAFETY ECLIPSE 18X1.5 (NEEDLE) ×1 IMPLANT
NEEDLE FILTER BLUNT 18X 1/2SAF (NEEDLE) ×2
NEEDLE FILTER BLUNT 18X1 1/2 (NEEDLE) ×1 IMPLANT
NEEDLE HYPO 18GX1.5 SHARP (NEEDLE) ×2
NEEDLE MAYO CATGUT SZ4 (NEEDLE) ×3 IMPLANT
NS IRRIG 1000ML POUR BTL (IV SOLUTION) ×3 IMPLANT
PACK HIP PROSTHESIS (MISCELLANEOUS) ×3 IMPLANT
PILLOW ABDUCTION FOAM SM (MISCELLANEOUS) ×3 IMPLANT
PULSAVAC PLUS IRRIG FAN TIP (DISPOSABLE) ×3
RETRIEVER SUT HEWSON (MISCELLANEOUS) ×3 IMPLANT
SLEEVE UNITRAX V40 STD (Orthopedic Implant) ×3 IMPLANT
SOL .9 NS 3000ML IRR  AL (IV SOLUTION) ×2
SOL .9 NS 3000ML IRR UROMATIC (IV SOLUTION) ×1 IMPLANT
STAPLER SKIN PROX 35W (STAPLE) ×3 IMPLANT
STEM HIP 4 127DEG (Stem) ×3 IMPLANT
SUT TICRON 2-0 30IN 311381 (SUTURE) ×15 IMPLANT
SUT VIC AB 0 CT1 36 (SUTURE) ×3 IMPLANT
SUT VIC AB 0 CT2 27 (SUTURE) ×3 IMPLANT
SUT VIC AB 2-0 CT2 27 (SUTURE) ×6 IMPLANT
SYR 10ML LL (SYRINGE) ×3 IMPLANT
SYR BULB IRRIG 60ML STRL (SYRINGE) ×3 IMPLANT
TAPE MICROFOAM 4IN (TAPE) ×3 IMPLANT
TAPE TRANSPORE STRL 2 31045 (GAUZE/BANDAGES/DRESSINGS) ×3 IMPLANT
TIP BRUSH PULSAVAC PLUS 24.33 (MISCELLANEOUS) ×3 IMPLANT
TIP FAN IRRIG PULSAVAC PLUS (DISPOSABLE) ×1 IMPLANT
TUBE SUCT KAM VAC (TUBING) IMPLANT

## 2019-10-23 NOTE — Progress Notes (Signed)
PROGRESS NOTE    Amy Hess  SNK:[id number] DOB: 07-08-1928 DOA: 10/20/2019 PCP: Kirk Ruths, MD    Brief Narrative:  Amy Hess is a 86 y.o. female with medical history significant of Lewy body dementia, hypertension, hyperlipidemia, GERD, hypothyroidism, rheumatoid arthritis, squamous cell cancer (thyroid, mouth and tongue), [name], diverticulosis, benign brain meningioma, [name], who presents with fall and left hip pain.    Consultants:   orthopedics  Procedures:   Antimicrobials:       Subjective: S/p or today. Mildly confused. Family at bedside.reports pain. No other complaints.   Objective: Vitals:   10/23/19 1219 10/23/19 1222 10/23/19 1319 10/23/19 1521  BP:  (!) 149/99 (!) 153/94 (!) 138/97  Pulse:   79 85  Resp: 11 18 16 16   Temp:    97.7 F (36.5 C)  TempSrc:    Oral  SpO2: 99% 100% (!) 86% (!) 85%  Weight:      Height:        Intake/Output Summary (Last 24 hours) at 10/23/2019 1757 Last data filed at 10/23/2019 1500 Gross per 24 hour  Intake 2891.69 ml  Output 625 ml  Net 2266.69 ml   Filed Weights   10/20/19 1214  Weight: 45.4 kg    Examination: Comfortable , nad, lying in bed cta no r/w/r Reg, s1/s2 no m/r/g Soft benign +bs No edema Awake, confused. Grossly intact   Data Reviewed: I have personally reviewed following labs and imaging studies  CBC: Recent Labs  Lab 10/20/19 1218 10/21/19 0642 10/22/19 0543  WBC 7.7 7.5 7.1  NEUTROABS 5.8  --   --   HGB 13.6 13.2 12.4  HCT 40.2 38.2 37.5  MCV 93.3 92.0 95.2  PLT 148* 141* 937*   Basic Metabolic Panel: Recent Labs  Lab 10/20/19 1218 10/21/19 0642 10/22/19 0543  NA 140 139 141  K 3.5 3.8 3.6  CL 102 103 107  CO2 28 27 23   GLUCOSE 109* 101* 80  BUN 18 16 20   CREATININE 0.94 0.78 1.09*  CALCIUM 8.9 8.6* 8.2*   GFR: Estimated Creatinine Clearance: 25.1 mL/min (A) (by C-G formula based on SCr of 1.09 mg/dL (H)). Liver  Function Tests: Recent Labs  Lab 10/20/19 1218  AST 29  ALT 20  ALKPHOS 45  BILITOT 1.6*  PROT 6.8  ALBUMIN 4.1   No results for input(s): LIPASE, AMYLASE in the last 168 hours. No results for input(s): AMMONIA in the last 168 hours. Coagulation Profile: Recent Labs  Lab 10/20/19 1505  INR 1.3*   Cardiac Enzymes: No results for input(s): CKTOTAL, CKMB, CKMBINDEX, TROPONINI in the last 168 hours. BNP (last 3 results) No results for input(s): PROBNP in the last 8760 hours. HbA1C: No results for input(s): HGBA1C in the last 72 hours. CBG: No results for input(s): GLUCAP in the last 168 hours. Lipid Profile: No results for input(s): CHOL, HDL, LDLCALC, TRIG, CHOLHDL, LDLDIRECT in the last 72 hours. Thyroid Function Tests: No results for input(s): TSH, T4TOTAL, FREET4, T3FREE, THYROIDAB in the last 72 hours. Anemia Panel: No results for input(s): VITAMINB12, FOLATE, FERRITIN, TIBC, IRON, RETICCTPCT in the last 72 hours. Sepsis Labs: No results for input(s): PROCALCITON, LATICACIDVEN in the last 168 hours.  Recent Results (from the past 240 hour(s))  Respiratory Panel by RT PCR (Flu A&B, Covid) - Nasopharyngeal Swab     Status: None   Collection Time: 10/20/19 12:18 PM   Specimen: Nasopharyngeal Swab  Result Value Ref Range Status  SARS Coronavirus 2 by RT PCR NEGATIVE NEGATIVE Final    Comment: (NOTE) SARS-CoV-2 target nucleic acids are NOT DETECTED.  The SARS-CoV-2 RNA is generally detectable in upper respiratoy specimens during the acute phase of infection. The lowest concentration of SARS-CoV-2 viral copies this assay can detect is 131 copies/mL. A negative result does not preclude SARS-Cov-2 infection and should not be used as the sole basis for treatment or other patient management decisions. A negative result may occur with  improper specimen collection/handling, submission of specimen other than nasopharyngeal swab, presence of viral mutation(s) within  the areas targeted by this assay, and inadequate number of viral copies (<131 copies/mL). A negative result must be combined with clinical observations, patient history, and epidemiological information. The expected result is Negative.  Fact Sheet for Patients:  PinkCheek.be  Fact Sheet for Healthcare Providers:  GravelBags.it  This test is no t yet approved or cleared by the [place] FDA and  has been authorized for detection and/or diagnosis of SARS-CoV-2 by FDA under an Emergency Use Authorization (EUA). This EUA will remain  in effect (meaning this test can be used) for the duration of the COVID-19 declaration under Section 564(b)(1) of the Act, 21 U.S.C. section 360bbb-3(b)(1), unless the authorization is terminated or revoked sooner.     Influenza A by PCR NEGATIVE NEGATIVE Final   Influenza B by PCR NEGATIVE NEGATIVE Final    Comment: (NOTE) The Xpert Xpress SARS-CoV-2/FLU/RSV assay is intended as an aid in  the diagnosis of influenza from Nasopharyngeal swab specimens and  should not be used as a sole basis for treatment. Nasal washings and  aspirates are unacceptable for Xpert Xpress SARS-CoV-2/FLU/RSV  testing.  Fact Sheet for Patients: PinkCheek.be  Fact Sheet for Healthcare Providers: GravelBags.it  This test is not yet approved or cleared by the [place] FDA and  has been authorized for detection and/or diagnosis of SARS-CoV-2 by  FDA under an Emergency Use Authorization (EUA). This EUA will remain  in effect (meaning this test can be used) for the duration of the  Covid-19 declaration under Section 564(b)(1) of the Act, 21  U.S.C. section 360bbb-3(b)(1), unless the authorization is  terminated or revoked. Performed at [facility], [address], [place], [place] [address]          Radiology Studies: DG Hip Port  Unilat With Pelvis 1V Left  Result Date: 10/23/2019 CLINICAL DATA:  Left hip hemiarthroplasty. EXAM: DG HIP (WITH OR WITHOUT PELVIS) 1V PORT LEFT COMPARISON:  Lumbar spine 10/20/2019. FINDINGS: Left hip hemiarthroplasty. Hardware intact. Anatomic alignment. Peripheral vascular calcification. Surgical clips right groin. IMPRESSION: Left hip hemiarthroplasty. Hardware intact. Anatomic alignment. Electronically Signed   By: Marcello Moores  Register   On: 10/23/2019 12:52        Scheduled Meds: . amLODipine  10 mg Oral Daily  . calcium-vitamin D  1 tablet Oral Daily  . carvedilol  3.125 mg Oral BID  . Chlorhexidine Gluconate Cloth  6 each Topical Daily  . docusate sodium  100 mg Oral BID  . famotidine  20 mg Oral QHS  . folic acid  1 mg Oral Daily  . levothyroxine  75 mcg Oral QAC breakfast  . loratadine  10 mg Oral Daily  . melatonin  2.5 mg Oral QHS  . memantine  10 mg Oral BID  . multivitamin with minerals  1 tablet Oral Daily  . predniSONE  10 mg Oral Q breakfast  . QUEtiapine  25 mg Oral BID  . [  START ON 10/24/2019] rivaroxaban  15 mg Oral Q supper  . senna  1 tablet Oral BID  . torsemide  5 mg Oral Daily  . traMADol  50 mg Oral Q6H   Continuous Infusions: . 0.45 % NaCl with KCl 20 mEq / L 75 mL/hr at 10/23/19 1440  . sodium chloride 900 mL/hr at 10/23/19 1126  .  ceFAZolin (ANCEF) IV 1 g (10/23/19 1550)    Assessment & Plan:   Principal Problem:   Fracture of femoral neck, left, closed ([place]) Active Problems:   Rheumatoid arthritis ([place])   HTN (hypertension), benign   Hypothyroidism   Benign meningioma of brain (HCC)   Chronic atrial fibrillation (HCC)   GERD (gastroesophageal reflux disease)   [name] (coronary artery disease)   Fall   Lewy body dementia ([place])     Fracture of femoral neck, left, closed [facility]):  S/p repair today Pain and bowel management PT when cleared by orthopedics   HTN (hypertension), benign Stable, continue with coreg,  torsemide    Hypothyroidism Continue synthroid  Benign meningioma of brain ([place]) F/u with neurosurgery  As outpt  Chronic atrial fibrillation ([place]) Continue coreg, start xeralto in am  GERD (gastroesophageal reflux disease) -continue with pepcid  [name] (coronary artery disease) -Continue Coreg  Fall -PT/OT when able to  Lewy body dementia: -Namenda  Rheumatoid arthritis: -prednisone increased from 5 to 10 daily. -was given stress dose steroid   DVT prophylaxis: SCD Code Status: DNR Family Communication: sitter at bedside   Status is: Inpatient  Remains inpatient appropriate because:Ongoing diagnostic testing needed not appropriate for outpatient work up   Dispo: The patient is from: Home              Anticipated d/c is to: home v.s snf              Anticipated d/c date is: 2 days              Patient currently is not medically stable to d/c.need PT evaluation when cleared by ortho            LOS: 3 days   Time spent: 35 min with >50% on coc    Nolberto Hanlon, MD [facility] Pager [phone]  If 7PM-7AM, please contact night-coverage [email or web address] Password [facility] 10/23/2019, 5:57 PM

## 2019-10-23 NOTE — Anesthesia Preprocedure Evaluation (Signed)
Anesthesia Evaluation  Patient identified by MRN, date of birth, ID band Patient awake    Reviewed: Allergy & Precautions, H&P , NPO status , Patient's Chart, lab work & pertinent test results, reviewed documented beta blocker date and time   Airway Mallampati: II   Neck ROM: full    Dental  (+) Poor Dentition   Pulmonary neg pulmonary ROS,    Pulmonary exam normal        Cardiovascular Exercise Tolerance: Poor hypertension, On Medications + [name]  Normal cardiovascular exam Rhythm:regular Rate:Normal     Neuro/Psych PSYCHIATRIC DISORDERS Dementia  Neuromuscular disease    GI/Hepatic Neg liver ROS, hiatal hernia, GERD  Medicated,  Endo/Other  Hypothyroidism   Renal/GU negative Renal ROS  negative genitourinary   Musculoskeletal   Abdominal   Peds  Hematology  (+) Blood dyscrasia, anemia ,   Anesthesia Other Findings Past Medical History: No date: Anemia No date: Arthritis No date: [name] (coronary artery disease) No date: Cancer ([place])     Comment:  thyroid and mouth cancer 2013, squamous cell of tongue No date: Dementia ([place]) No date: Diverticulosis No date: Eczema No date: GERD (gastroesophageal reflux disease) No date: History of kidney stones No date: Hypertension No date: Osteoarthritis No date: Rheumatoid arthritis ([place]) Past Surgical History: No date: CHOLECYSTECTOMY 06/28/2014: ERCP; N/A     Comment:  Procedure: ENDOSCOPIC RETROGRADE               CHOLANGIOPANCREATOGRAPHY (ERCP);  Surgeon: Arta Silence, MD;  [facility] ENDOSCOPY;  Service: Endoscopy;              Laterality: N/A; 08/19/2014: ESOPHAGOGASTRODUODENOSCOPY (EGD) WITH PROPOFOL; N/A     Comment:  Procedure: ESOPHAGOGASTRODUODENOSCOPY (EGD) WITH               PROPOFOL;  Surgeon: Arta Silence, MD;  Location: [facility]               ENDOSCOPY;  Service: Endoscopy;  Laterality: N/A; 08/19/2014: GASTROINTESTINAL STENT REMOVAL; N/A      Comment:  Procedure: GASTROINTESTINAL STENT REMOVAL;  Surgeon:               Arta Silence, MD;  Location: [facility] ENDOSCOPY;  Service:               Endoscopy;  Laterality: N/A; 06/20/2017: KNEE ARTHROPLASTY; Right     Comment:  Procedure: COMPUTER ASSISTED TOTAL KNEE ARTHROPLASTY;                Surgeon: Dereck Leep, MD;  Location: [facility] ORS;                Service: Orthopedics;  Laterality: Right; No date: laser varicose vein surgery No date: TOTAL THYROIDECTOMY 09/17/1998: uterine polyp removal BMI    Body Mass Index: 18.29 kg/m     Reproductive/Obstetrics negative OB ROS                             Anesthesia Physical Anesthesia Plan  ASA: III  Anesthesia Plan: General and Regional   Post-op Pain Management:    Induction:   PONV Risk Score and Plan: 4 or greater  Airway Management Planned:   Additional Equipment:   Intra-op Plan:   Post-operative Plan:   Informed Consent: I have reviewed the patients History and Physical, chart, labs and discussed the procedure including the risks,  benefits and alternatives for the proposed anesthesia with the patient or authorized representative who has indicated his/her understanding and acceptance.     Dental Advisory Given  Plan Discussed with: CRNA  Anesthesia Plan Comments:         Anesthesia Quick Evaluation

## 2019-10-23 NOTE — Op Note (Signed)
10/23/2019  12:17 PM  PATIENT:  Amy Hess   MRN: [id number]  PRE-OPERATIVE DIAGNOSIS:  left femoral neck hip fracture  POST-OPERATIVE DIAGNOSIS:  same  PROCEDURE:  Procedure(s): LEFT HIP HEMIARTHROPLASTY  PREOPERATIVE INDICATIONS:    Amy Hess is an 86 y.o. female who was admitted with a diagnosis of left hip fracture.  I have recommended surgical fixation with hemiarthroplasty for this injury. I have explained the surgery and the postoperative course to the patient and her daughter who have agreed with surgical management of this fracture.  Consent was signed by the patient's daughter due to the patient's dementia.  The risks benefits and alternatives were discussed with the patient and their family including but not limited to the risks of  infection requiring removal of the prosthesis, bleeding requiring blood transfusion, nerve injury especially to the sciatic nerve leading to foot drop or lower extremity numbness, periprosthetic fracture, dislocation leg length discrepancy, change in lower extremity rotation persistent hip pain, loosening or failure of the components and the need for revision surgery. Medical risks include but are not limited to DVT and pulmonary embolism, myocardial infarction, stroke, pneumonia, respiratory failure and death.  OPERATIVE REPORT     SURGEON:  Thornton Park, MD    ASSISTANT:  Surgical tech    ANESTHESIA:  Spinal    COMPLICATIONS:  None.   SPECIMEN: Femoral head to pathology    COMPONENTS:  Stryker Accolade 2 femoral component size 4 with a 79m Stryker Unitrax Head and standard neck adjustment sleeve.    PROCEDURE IN DETAIL:   The patient was met in the holding area with her daughter at the bedside.  The left hip was identified and marked at the operative site after verbally confirming with the patient that this was the correct site of surgery.  The patient was then transported to the OR  and  underwent spinal  anesthesia.  The patient was then placed in the lateral decubitus position with the operative side up and secured on the operating room table with a pegboard and all bony prominences were adequately padded. This included an axillary roll and additional padding around the nonoperative leg to prevent compression to the common peroneal nerve.    The operative lower extremity was prepped and draped in a sterile fashion.  A time out was performed prior to incision to verify patient's name, date of birth, medical record number, correct site of surgery correct procedure to be performed. The timeout was also used to verify the patient received antibiotics now appropriate instruments, implants and radiographic studies were available in the room. Once all in attendance were in agreement case began.    A posterolateral approach was utilized via sharp dissection  carried down to the subcutaneous tissue.  Bleeding vessels were coagulated using electrocautery.  The fascia lata was identified and incised along the length of the skin incision.  The gluteus maximus muscle was then split in line with its fibers. Self-retaining retractors were  inserted.  With the hip internally rotated, the short external rotators  were identified and removed from the posterior attachment from the greater trochanter. The piriformis was tagged for later repair. The capsule was identified and a T-shaped capsulotomy was performed. The capsule was tagged with #2 Tycron for later repair.  The femoral neck fracture was exposed, and the femoral head was removed using a corkscrew device. This was measured to be 46 mm in diameter. The attention was then turned to proximal femur preparation.  An oscillating  saw was used to perform a proximal femoral osteotomy 1 fingerbreadth above the lesser trochanter. The trial 46 mm femoral head was placed into the acetabulum and had an excellent suction fit. The attention was then turned back to femoral  preparation.    A femoral skid and Cobra retractor were placed under the femoral neck to allow for adequate visualization. A box osteotome was used to make the initial entry into the proximal femur. A single hand reamer was used to prepare the femoral canal. A T-shaped femoral canal sounder was then used to ensure no penetration femoral cortex had occurred during reaming. The proximal femur was then sequentially broached by hand. A size 4 femoral trial broach was found to have best medial to lateral canal fit. Once adequate mediolateral canal fill was achieved the trial femoral broach, neck, and head was assembled and the hip was reduced. It was found to have excellent stability, equivalent leg lengths with functional range of motion. The trial components were then removed.  I copiously irrigated the femoral canal and then impacted the real femoral prosthesis into place into the appropriate version, slightly anteverted to the normal anatomy, and I impacted the actual 46 mm Unitrax femoral head  component with a standard neck adjustment sleeve into place. The hip was then reduced and taken through functional range of motion and found to have excellent stability. Leg lengths were restored. The hip joint was copiously irrigated.   A soft tissue repair of the capsule and external rotators was performed using #2 Tycron Excellent posterior capsular repair was achieved. The fascia lata was then closed with interrupted 0 Vicryl suture. The subcutaneous tissues were closed with 2-0 Vicryl and the skin approximated with staples.   The patient was then placed supine on the operative table. Leg lengths were checked clinically and found to be equivalent. An abduction pillow was placed between the lower extremities. The patient was then transferred to a hospital bed and brought to the PACU in stable condition. I was scrubbed and present the entire case and all sharp and instrument counts were correct at the conclusion of  the case. I spoke with the patient's daughter in the postop consultation room to let her know the case was completed without complication patient was stable in recovery room.  We discussed the post-op course and I answered all her questions.   Timoteo Gaul, MD Orthopedic Surgeon

## 2019-10-23 NOTE — Progress Notes (Signed)
Patient was seen in the pre-op area with her daughter this morning.  The patient has been re-examined, and the chart reviewed, and there have been no interval changes to the documented history and physical.    The risks, benefits, and alternatives have been discussed at length, and the patient's daughter is willing to proceed.

## 2019-10-23 NOTE — Anesthesia Procedure Notes (Addendum)
Spinal  Patient location during procedure: OR Start time: 10/23/2019 9:20 AM End time: 10/23/2019 9:26 AM Staffing Performed: resident/CRNA  Anesthesiologist: Molli Barrows, MD Resident/CRNA: [name], CRNA Preanesthetic Checklist Completed: patient identified, IV checked, site marked, risks and benefits discussed, surgical consent, monitors and equipment checked, pre-op evaluation and timeout performed Spinal Block Patient position: right lateral decubitus Prep: ChloraPrep Patient monitoring: heart rate, continuous pulse ox, blood pressure and cardiac monitor Approach: midline Location: L3-4 Injection technique: single-shot Needle Needle type: Introducer and Pencan  Needle gauge: 24 G Needle length: 9 cm Additional Notes Negative paresthesia. Negative blood return. Positive free-flowing CSF. Expiration date of kit checked and confirmed. Patient tolerated procedure well, without complications. Lot# [id number]

## 2019-10-23 NOTE — Transfer of Care (Signed)
Immediate Anesthesia Transfer of Care Note  Patient: Amy Hess  Procedure(s) Performed: ARTHROPLASTY BIPOLAR HIP (HEMIARTHROPLASTY) (Left Hip)  Patient Location: PACU  Anesthesia Type:spinal  Level of Consciousness: sedated  Airway & Oxygen Therapy: Patient Spontanous Breathing and Patient connected to face mask oxygen  Post-op Assessment: Report given to RN and Post -op Vital signs reviewed and stable  Post vital signs: Reviewed and stable  Last Vitals:  Vitals Value Taken Time  BP 119/80 10/23/19 1125  Temp 36.4 C 10/23/19 1122  Pulse 73 10/23/19 1125  Resp 15 10/23/19 1126  SpO2 100 % 10/23/19 1125  Vitals shown include unvalidated device data.  Last Pain:  Vitals:   10/23/19 1125  TempSrc:   PainSc: 0-No pain         Complications: No complications documented.

## 2019-10-24 DIAGNOSIS — S72002S Fracture of unspecified part of neck of left femur, sequela: Secondary | ICD-10-CM

## 2019-10-24 DIAGNOSIS — M069 Rheumatoid arthritis, unspecified: Secondary | ICD-10-CM

## 2019-10-24 LAB — BASIC METABOLIC PANEL
Anion gap: 8 (ref 5–15)
BUN: 10 mg/dL (ref 8–23)
CO2: 22 mmol/L (ref 22–32)
Calcium: 7.7 mg/dL — ABNORMAL LOW (ref 8.9–10.3)
Chloride: 107 mmol/L (ref 98–111)
Creatinine, Ser: 0.71 mg/dL (ref 0.44–1.00)
GFR calc Af Amer: >60 mL/min (ref >60)
GFR calc non Af Amer: >60 mL/min (ref >60)
Glucose, Bld: 114 mg/dL — ABNORMAL HIGH (ref 70–99)
Potassium: 3.3 mmol/L — ABNORMAL LOW (ref 3.5–5.1)
Sodium: 137 mmol/L (ref 135–145)

## 2019-10-24 LAB — CBC
HCT: 34.1 % — ABNORMAL LOW (ref 36.0–46.0)
Hemoglobin: 11.4 g/dL — ABNORMAL LOW (ref 12.0–15.0)
MCH: 31.9 pg (ref 26.0–34.0)
MCHC: 33.4 g/dL (ref 30.0–36.0)
MCV: 95.5 fL (ref 80.0–100.0)
Platelets: 146 10*3/uL — ABNORMAL LOW (ref 150–400)
RBC: 3.57 MIL/uL — ABNORMAL LOW (ref 3.87–5.11)
RDW: 13.2 % (ref 11.5–15.5)
WBC: 8.1 10*3/uL (ref 4.0–10.5)
nRBC: 0 % (ref 0.0–0.2)

## 2019-10-24 MED ORDER — POTASSIUM CHLORIDE CRYS ER 20 MEQ PO TBCR
40.0000 meq | EXTENDED_RELEASE_TABLET | Freq: Once | ORAL | Status: AC
  Administered 2019-10-24: 40 meq via ORAL
  Filled 2019-10-24: qty 2

## 2019-10-24 MED ORDER — FAMOTIDINE 20 MG PO TABS
10.0000 mg | ORAL_TABLET | Freq: Every day | ORAL | Status: DC
  Administered 2019-10-24 – 2019-10-25 (×2): 10 mg via ORAL
  Filled 2019-10-24 (×2): qty 1

## 2019-10-24 MED ORDER — POLYETHYLENE GLYCOL 3350 17 G PO PACK
17.0000 g | PACK | Freq: Every day | ORAL | Status: DC
  Filled 2019-10-24: qty 1

## 2019-10-24 NOTE — Progress Notes (Signed)
PROGRESS NOTE    Amy Hess  LYY:[id number] DOB: [birth year] DOA: 10/20/2019 PCP: Amy Ruths, MD    Brief Narrative:  Amy Hess a 86 y.o.femalewith medical history significant ofLewy body dementia, hypertension, hyperlipidemia, GERD, hypothyroidism, rheumatoid arthritis, squamous cell cancer (thyroid, mouth and tongue), [name], diverticulosis, benign brain meningioma, [name], who presents with fall and left hip pain.  10/1-daughter at bedside adamant that patient will not need to go to rehab and wants her mom to go home with home PT.   Consultants:   Orthopedics  Procedures:   Antimicrobials:       Subjective: Patient is confused, per daughter pretty much at her baseline.  She is eating.  Pleasant.  Denies any pain.  Objective: Vitals:   10/23/19 1521 10/23/19 1531 10/23/19 2350 10/24/19 0745  BP: (!) 138/97  (!) 163/102 118/83  Pulse: 85  90 79  Resp: 16  18 17   Temp: 97.7 F (36.5 C)  98.3 F (36.8 C) 97.9 F (36.6 C)  TempSrc: Oral  Oral Oral  SpO2: (!) 85% 95% 100% 99%  Weight:      Height:        Intake/Output Summary (Last 24 hours) at 10/24/2019 0815 Last data filed at 10/24/2019 0654 Gross per 24 hour  Intake 4001.69 ml  Output 625 ml  Net 3376.69 ml   Filed Weights   10/20/19 1214  Weight: 45.4 kg    Examination:  General exam: Appears calm and comfortable  Respiratory system: Clear to auscultation. Respiratory effort normal. Cardiovascular system: S1 & S2 heard, RRR. No JVD, murmurs, rubs, gallops or clicks.  Gastrointestinal system: Abdomen is nondistended, soft and nontender. Normal bowel sounds heard. Central nervous system: Awake, confused, grossly intact Extremities: No edema Skin: Warm dry Psychiatry:  Mood & affect appropriate and at baseline.     Data Reviewed: I have personally reviewed following labs and imaging studies  CBC: Recent Labs  Lab 10/20/19 1218 10/21/19 0642  10/22/19 0543 10/24/19 0604  WBC 7.7 7.5 7.1 8.1  NEUTROABS 5.8  --   --   --   HGB 13.6 13.2 12.4 11.4*  HCT 40.2 38.2 37.5 34.1*  MCV 93.3 92.0 95.2 95.5  PLT 148* 141* 149* 127*   Basic Metabolic Panel: Recent Labs  Lab 10/20/19 1218 10/21/19 0642 10/22/19 0543 10/24/19 0604  NA 140 139 141 137  K 3.5 3.8 3.6 3.3*  CL 102 103 107 107  CO2 28 27 23 22   GLUCOSE 109* 101* 80 114*  BUN 18 16 20 10   CREATININE 0.94 0.78 1.09* 0.71  CALCIUM 8.9 8.6* 8.2* 7.7*   GFR: Estimated Creatinine Clearance: 34.2 mL/min (by C-G formula based on SCr of 0.71 mg/dL). Liver Function Tests: Recent Labs  Lab 10/20/19 1218  AST 29  ALT 20  ALKPHOS 45  BILITOT 1.6*  PROT 6.8  ALBUMIN 4.1   No results for input(s): LIPASE, AMYLASE in the last 168 hours. No results for input(s): AMMONIA in the last 168 hours. Coagulation Profile: Recent Labs  Lab 10/20/19 1505  INR 1.3*   Cardiac Enzymes: No results for input(s): CKTOTAL, CKMB, CKMBINDEX, TROPONINI in the last 168 hours. BNP (last 3 results) No results for input(s): PROBNP in the last 8760 hours. HbA1C: No results for input(s): HGBA1C in the last 72 hours. CBG: No results for input(s): GLUCAP in the last 168 hours. Lipid Profile: No results for input(s): CHOL, HDL, LDLCALC, TRIG, CHOLHDL, LDLDIRECT in the last 72  hours. Thyroid Function Tests: No results for input(s): TSH, T4TOTAL, FREET4, T3FREE, THYROIDAB in the last 72 hours. Anemia Panel: No results for input(s): VITAMINB12, FOLATE, FERRITIN, TIBC, IRON, RETICCTPCT in the last 72 hours. Sepsis Labs: No results for input(s): PROCALCITON, LATICACIDVEN in the last 168 hours.  Recent Results (from the past 240 hour(s))  Respiratory Panel by RT PCR (Flu A&B, Covid) - Nasopharyngeal Swab     Status: None   Collection Time: 10/20/19 12:18 PM   Specimen: Nasopharyngeal Swab  Result Value Ref Range Status   SARS Coronavirus 2 by RT PCR NEGATIVE NEGATIVE Final    Comment:  (NOTE) SARS-CoV-2 target nucleic acids are NOT DETECTED.  The SARS-CoV-2 RNA is generally detectable in upper respiratoy specimens during the acute phase of infection. The lowest concentration of SARS-CoV-2 viral copies this assay can detect is 131 copies/mL. A negative result does not preclude SARS-Cov-2 infection and should not be used as the sole basis for treatment or other patient management decisions. A negative result may occur with  improper specimen collection/handling, submission of specimen other than nasopharyngeal swab, presence of viral mutation(s) within the areas targeted by this assay, and inadequate number of viral copies (<131 copies/mL). A negative result must be combined with clinical observations, patient history, and epidemiological information. The expected result is Negative.  Fact Sheet for Patients:  PinkCheek.be  Fact Sheet for Healthcare Providers:  GravelBags.it  This test is no t yet approved or cleared by the [place] FDA and  has been authorized for detection and/or diagnosis of SARS-CoV-2 by FDA under an Emergency Use Authorization (EUA). This EUA will remain  in effect (meaning this test can be used) for the duration of the COVID-19 declaration under Section 564(b)(1) of the Act, 21 U.S.C. section 360bbb-3(b)(1), unless the authorization is terminated or revoked sooner.     Influenza A by PCR NEGATIVE NEGATIVE Final   Influenza B by PCR NEGATIVE NEGATIVE Final    Comment: (NOTE) The Xpert Xpress SARS-CoV-2/FLU/RSV assay is intended as an aid in  the diagnosis of influenza from Nasopharyngeal swab specimens and  should not be used as a sole basis for treatment. Nasal washings and  aspirates are unacceptable for Xpert Xpress SARS-CoV-2/FLU/RSV  testing.  Fact Sheet for Patients: PinkCheek.be  Fact Sheet for Healthcare  Providers: GravelBags.it  This test is not yet approved or cleared by the [place] FDA and  has been authorized for detection and/or diagnosis of SARS-CoV-2 by  FDA under an Emergency Use Authorization (EUA). This EUA will remain  in effect (meaning this test can be used) for the duration of the  Covid-19 declaration under Section 564(b)(1) of the Act, 21  U.S.C. section 360bbb-3(b)(1), unless the authorization is  terminated or revoked. Performed at [facility], [address], [place], [place] [address]          Radiology Studies: DG Hip Port Unilat With Pelvis 1V Left  Result Date: 10/23/2019 CLINICAL DATA:  Left hip hemiarthroplasty. EXAM: DG HIP (WITH OR WITHOUT PELVIS) 1V PORT LEFT COMPARISON:  Lumbar spine 10/20/2019. FINDINGS: Left hip hemiarthroplasty. Hardware intact. Anatomic alignment. Peripheral vascular calcification. Surgical clips right groin. IMPRESSION: Left hip hemiarthroplasty. Hardware intact. Anatomic alignment. Electronically Signed   By: Marcello Moores  Register   On: 10/23/2019 12:52        Scheduled Meds: . amLODipine  10 mg Oral Daily  . calcium-vitamin D  1 tablet Oral Daily  . carvedilol  3.125 mg Oral BID  . Chlorhexidine Gluconate  Cloth  6 each Topical Daily  . docusate sodium  100 mg Oral BID  . famotidine  20 mg Oral QHS  . folic acid  1 mg Oral Daily  . levothyroxine  75 mcg Oral QAC breakfast  . loratadine  10 mg Oral Daily  . melatonin  2.5 mg Oral QHS  . memantine  10 mg Oral BID  . multivitamin with minerals  1 tablet Oral Daily  . predniSONE  10 mg Oral Q breakfast  . QUEtiapine  25 mg Oral BID  . rivaroxaban  15 mg Oral Q supper  . senna  1 tablet Oral BID  . torsemide  5 mg Oral Daily  . traMADol  50 mg Oral Q6H   Continuous Infusions: . 0.45 % NaCl with KCl 20 mEq / L 50 mL/hr at 10/23/19 1810  . sodium chloride 900 mL/hr at 10/23/19 1126    Assessment & Plan:   Principal Problem:    Fracture of femoral neck, left, closed ([place]) Active Problems:   Rheumatoid arthritis ([place])   HTN (hypertension), benign   Hypothyroidism   Benign meningioma of brain ([place])   Chronic atrial fibrillation ([facility])   GERD (gastroesophageal reflux disease)   [name] (coronary artery disease)   Fall   Lewy body dementia ([place])   Fracture of femoral neck, left, closed [facility]): Status post left hip hemiarthroplasty  PT recommends SNF -however patient's daughter refuses patient to go to rehab and wants to take  patient home.  Restarted on Xarelto which will cover for DVT prophylaxis Patient is weightbearing as tolerated on the left lower extremity She will be discharged on tramadol and Tylenol for pain to avoid exacerbation of her dementia per Ortho's request Follow-up with Dr. Mack Guise in 10 to 14 days after discharge Home health PT and home health aide Pain control Add MiraLAX daily to bowel regimen Continue Colace and senna    HTN (hypertension), benign Stable, continue with Coreg and torsemide   Urinary retention-postoperatively Will make sure patient has a bowel movement to help with urination Bladder scan every 4 hours and if more than 250 will need to straight cath for now   Hypothyroidism Continue Synthroid  Benign meningioma of brain [facility]) Follow-up with neurosurgery as outpatient    Chronic atrial fibrillation ([place]) Continue Coreg, and Xarelto    GERD (gastroesophageal reflux disease) -Continue Pepcid   [name] (coronary artery disease) -Continue Coreg  Fall PT/OT recommended SNF  Lewy body dementia: -[place]  Rheumatoid arthritis: -Prednisone was increased from 5-10, given stress dose steroids  We will decrease to home dose upon discharge     DVT prophylaxis: Xarelto Code Status: DNR Family Communication: Daughter at bedside updated, discussed extensively with daughter benefits of rehab v.s. HHPT. But she is concerned about multitude of things  including covid if pt goes to rehab. She is adamant that her mother can do well at home and wants her mother to be d/'cd home. Orthopedics recommends pt getting 2 days of PT here prior to d/c for safe d/c planning if she is not going to rehab.  Status is: Inpatient  Remains inpatient appropriate because:Unsafe d/c plan   Dispo: The patient is from: Home              Anticipated d/c is to: Home              Anticipated d/c date is: 2 days              Patient currently is  medically stable to d/c.Needs PT per orthopedics for 2 days before going home post op.             LOS: 4 days   Time spent: 45 min with >50% on coc    Nolberto Hanlon, MD [facility] Pager [phone]  If 7PM-7AM, please contact night-coverage [email or web address] Password [email or web address] 10/24/2019, 8:15 AM

## 2019-10-24 NOTE — TOC Progression Note (Signed)
Transition of Care [facility]) - Progression Note    Patient Details  Name: Amy Hess MRN: [id number] Date of Birth: [birth year]  [facility]) CM/SW [place], LCSW Phone Number: 10/24/2019, 1:34 PM  Clinical Narrative:    CSW with patient and daughter at bedside to discuss discharge plan. Patient's daughter has decided to take patient home with home health/PT services. CSW completed referral to [facility] with [employer] for needed DME.   Expected Discharge Plan: [place] Barriers to Discharge: Continued Medical Work up  Expected Discharge Plan and Services Expected Discharge Plan: [place] In-house Referral: Clinical Social Work   Post Acute Care Choice: [place] arrangements for the past 2 months: Single Family Home                                       Social Determinants of Health (SDOH) Interventions    Readmission Risk Interventions No flowsheet data found.

## 2019-10-24 NOTE — Progress Notes (Signed)
Physical Therapy Treatment Patient Details Name: Amy Hess MRN: [id number] DOB: March 08, 1928 Today's Date: 10/24/2019    History of Present Illness 86 y.o. female with medical history significant of Lewy body dementia, hypertension, hyperlipidemia, GERD, hypothyroidism, rheumatoid arthritis, squamous cell cancer (thyroid, mouth and tongue), [name], diverticulosis, benign brain meningioma, [name], who presents with fall and left hip fracture.  S/P total hip replacement (posterior approach) 9/30.    PT Comments    Pt continues to be very limited with what she is able to do even with extensive and multimodal cuing from both PT and daughter (present and assisting t/o session).  Pt remains confused and unable to meaningfully interact with PT and though there were occasionally times where she managed to do some fleeting AROM with exercises she ultimately was PROM for a vast majority of these.  We did try standing again this afternoon with better results than AM session, but still very limited, unsafe and with inconsistent ability to move feet, WB though UEs, shift weight etc.  Pt continues to be very limited functionally but did manage to tolerate standing activities slightly better this afternoon.  Follow Up Recommendations  SNF (family still determined to take her home)     Equipment Recommendations   (gait belt issued (10/1), BSC)    Recommendations for Other Services       Precautions / Restrictions Precautions Precautions: Fall;Posterior Hip Precaution Comments: educated daughter, pt with poor insight at eval Restrictions Weight Bearing Restrictions: Yes LLE Weight Bearing: Weight bearing as tolerated    Mobility  Bed Mobility Overal bed mobility: Needs Assistance Bed Mobility: Sit to Supine     Supine to sit: Max assist Sit to supine: Max assist   General bed mobility comments: Pt continues to be very limited with cognition of requested acts and  needed heavy assist to/from sitting  Transfers Overall transfer level: Needs assistance Equipment used: Rolling walker (2 wheeled) Transfers: Sit to/from Stand Sit to Stand: Max assist         General transfer comment: Pt was better able to attempt standing this afternoon, but still requires very heavy assist.  She had L knee buckling in the L and difficulty with even getting to full extension.  able to maintain with only mod assist at times  Ambulation/Gait             General Gait Details: unable to truly ambulate, but did manage a few very labored and heavily assisted (cuing, phyiscally unweighting, direct LE assist at times) side shuffles along EOB   Stairs             Wheelchair Mobility    Modified Rankin (Stroke Patients Only)       Balance Overall balance assessment: Needs assistance Sitting-balance support: Bilateral upper extremity supported Sitting balance-Leahy Scale: Fair Sitting balance - Comments: once assist to sitting pt was able to maintain sitting EOB with only occasional min assist   Standing balance support: Bilateral upper extremity supported Standing balance-Leahy Scale: Poor Standing balance comment: showed some ability to take weight through UEs/walker, but generally did poorly needing direct assist the entire time in standing                            Cognition Arousal/Alertness: Awake/alert Behavior During Therapy: Impulsive Overall Cognitive Status: History of cognitive impairments - at baseline  Exercises Total Joint Exercises Ankle Circles/Pumps: AROM;15 reps (pt better able to initiate some motion on her own) Quad Sets: AAROM;10 reps (pt unable to actually engage quad despite multimodal cuing) Short Arc Quad: PROM;15 reps (unable to initate movement) Heel Slides: AAROM;PROM;15 reps (no AROM for slides, occasional light push with LE extensions) Hip  ABduction/ADduction: AAROM;PROM;15 reps (very little apparent effort or ability to engage) Other Exercises Other Exercises: Family educated re: OT role, DME recs, d/c recs, IS (frequency and use), functional spplication of posterior hip pcns Other Exercises: LBD, sit>sup, sit<>stand, sitting/standing balance/tolerance,     General Comments        Pertinent Vitals/Pain Pain Assessment: No/denies pain Faces Pain Scale: Hurts even more    Home Living Family/patient expects to be discharged to:: Private residence Living Arrangements: Alone (24/7 assist, caregivers during day, daughter at night) Available Help at Discharge: Family Type of Home: House Home Access: Stairs to enter Entrance Stairs-Rails: Right Home Layout: One level Home Equipment: Environmental consultant - 2 wheels      Prior Function Level of Independence: Needs assistance  Gait / Transfers Assistance Needed: Pt able to ambulate w/o AD for in home distances, able to do some out of home activity with family ADL's / Homemaking Assistance Needed: assist available 24/7, cognition varies how much assist for ADLs - DTR reports sometimes pt sweeps and other times need sassist c dressing/bathing      PT Goals (current goals can now be found in the care plan section) Acute Rehab PT Goals Patient Stated Goal: daughter very much wants her to go home, pt unable to state goals Progress towards PT goals: Progressing toward goals    Frequency    BID      PT Plan Current plan remains appropriate    Co-evaluation              AM-PAC PT "6 Clicks" Mobility   Outcome Measure  Help needed turning from your back to your side while in a flat bed without using bedrails?: Total Help needed moving from lying on your back to sitting on the side of a flat bed without using bedrails?: Total Help needed moving to and from a bed to a chair (including a wheelchair)?: Total Help needed standing up from a chair using your arms (e.g., wheelchair or  bedside chair)?: Total Help needed to walk in hospital room?: Total Help needed climbing 3-5 steps with a railing? : Total 6 Click Score: 6    End of Session Equipment Utilized During Treatment: Gait belt Activity Tolerance: Patient limited by pain (cognition) Patient left: with call bell/phone within reach;with bed alarm set;with family/visitor present   PT Visit Diagnosis: Muscle weakness (generalized) (M62.81);Difficulty in walking, not elsewhere classified (R26.2);Pain Pain - Right/Left: Left Pain - part of body: Hip     Time: 1350-1433 PT Time Calculation (min) (ACUTE ONLY): 43 min  Charges:  $Therapeutic Exercise: 8-22 mins $Therapeutic Activity: 23-37 mins                     Kreg Shropshire, DPT 10/24/2019, 3:59 PM

## 2019-10-24 NOTE — Care Management Important Message (Signed)
Important Message  Patient Details  Name: SIMONE RODENBECK MRN: [id number] Date of Birth: May 12, 1928   Medicare Important Message Given:  Yes     Dannette Barbara 10/24/2019, 11:12 AM

## 2019-10-24 NOTE — Evaluation (Signed)
Occupational Therapy Evaluation Patient Details Name: Amy Hess MRN: [id number] DOB: May 06, 1928 Today's Date: 10/24/2019    History of Present Illness 86 y.o. female with medical history significant of Lewy body dementia, hypertension, hyperlipidemia, GERD, hypothyroidism, rheumatoid arthritis, squamous cell cancer (thyroid, mouth and tongue), [name], diverticulosis, benign brain meningioma, [name], who presents with fall and left hip fracture.  S/P total hip replacement (posterior approach) 9/30.   Clinical Impression   Amy Hess was seen for OT evaluation this date, overlapping c PT for safe mobility. Prior to hospital admission, pt was Independent c mobility and assist for ADLs varied c fluctuating cognition - DTR reports sometimes pt sweeps and other times needs assist for dressing/bathing. Pt lives alone c 24/7 assist (caregivers during day, DTR at night). Pt presents to acute OT demonstrating impaired ADL performance, functional cognition and functional mobility 2/2 functional strength/balance deficits, decreased safety awareness, poor command following, and decreased LB access.  Upon entry, pt seated EOB c PT requesting +2 for transfer attempt. TOTAL A x2 + RW sit<>stand - MAX multimodal cues for hand placement and technique; pt unable to WB through LLE and L knee buckling noted. Pt unable to achieve upright posture or sequence taking step. Pt currently unable to follow commands/sequence for seated grooming tasks. MIN A self-drinking at bed level. TOTAL A for LBD at bed level. Pt would benefit from skilled OT to address noted impairments and functional limitations (see below for any additional details) in order to maximize safety and independence while minimizing falls risk and caregiver burden. Upon hospital discharge, recommend STR to maximize pt safety and return to PLOF.     Follow Up Recommendations  SNF;Supervision/Assistance - 24 hour    Equipment  Recommendations  3 in 1 bedside commode (if DC home)    Recommendations for Other Services       Precautions / Restrictions Precautions Precautions: Fall;Posterior Hip Precaution Booklet Issued: Yes (comment) Precaution Comments: educated daughter, pt will poor insight at eval Restrictions Weight Bearing Restrictions: Yes LLE Weight Bearing: Weight bearing as tolerated      Mobility Bed Mobility Overal bed mobility: Needs Assistance Bed Mobility: Sit to Supine     Sit to supine: Max assist   General bed mobility comments: Pt received seated EOB c PT  Transfers Overall transfer level: Needs assistance Equipment used: Rolling walker (2 wheeled) Transfers: Sit to/from Stand Sit to Stand: Total assist;+2 physical assistance      General transfer comment: MAX multimodal cues for hand placement and techinque - pt unable to WB through LLE and L knee buckling noted. Pt unable to achieve upright posture ot sequence taking steps    Balance Overall balance assessment: Needs assistance Sitting-balance support: Bilateral upper extremity supported Sitting balance-Leahy Scale: Poor     Standing balance support: Bilateral upper extremity supported Standing balance-Leahy Scale: Zero           ADL either performed or assessed with clinical judgement   ADL Overall ADL's : Needs assistance/impaired         General ADL Comments: MIN A self-drinking at bed level. TOTAL A for LBD at bed level. TOTAL A bed mobility - unsafe to attempt ADL t/f. Unable to follow commands/sequence for seated grooming tasks     Vision Baseline Vision/History: Wears glasses Wears Glasses: At all times              Pertinent Vitals/Pain Pain Assessment: No/denies pain Faces Pain Scale: Hurts even more Pain Location: indicates  pain with all L LE tasks     Hand Dominance Right   Extremity/Trunk Assessment Upper Extremity Assessment Upper Extremity Assessment: Generalized weakness (AROM WFL,  MMT 4/5 grossly)   Lower Extremity Assessment Lower Extremity Assessment: Defer to PT evaluation LLE Deficits / Details: Pt struggled to do any real active ROM on L, occasional fleeting light push response here and there       Communication Communication Communication:  ( rambling off topic little ability to converse appropriately)   Cognition Arousal/Alertness: Awake/alert Behavior During Therapy: Impulsive Overall Cognitive Status: History of cognitive impairments - at baseline ( per daughter she has good and bad bouts, more off today). Unable to sequence tasks or follow VCs, does better c mimicking or object presentation         Exercises Exercises: Other exercises Other Exercises Other Exercises: Family educated re: OT role, DME recs, d/c recs, IS (frequency and use), functional spplication of posterior hip pcns Other Exercises: LBD, sit>sup, sit<>stand, sitting/standing balance/tolerance,    Shoulder Instructions      Home Living Family/patient expects to be discharged to:: Private residence Living Arrangements: Alone (24/7 assist, caregivers during day, daughter at night) Available Help at Discharge: Family Type of Home: House Home Access: Stairs to enter Technical brewer of Steps: 4 Entrance Stairs-Rails: Right Home Layout: One [place]: Environmental consultant - 2 wheels          Prior Functioning/Environment Level of Independence: Needs assistance  Gait / Transfers Assistance Needed: Pt able to ambulate w/o AD for in home distances, able to do some out of home activity with family ADL's / Homemaking Assistance Needed: assist available 24/7, cognition varies how much assist for ADLs - DTR reports sometimes pt sweeps and other times needs assist c dressing/bathing             OT Problem List: Decreased strength;Decreased activity tolerance;Impaired balance (sitting and/or standing);Decreased safety awareness;Decreased cognition;Pain      OT  Treatment/Interventions: Self-care/ADL training;Therapeutic exercise;Energy conservation;DME and/or AE instruction;Therapeutic activities;Patient/family education;Balance training;Cognitive remediation/compensation    OT Goals(Current goals can be found in the care plan section) Acute Rehab OT Goals Patient Stated Goal: daughter very much wants her to go home, pt unable to state goals OT Goal Formulation: With family Time For Goal Achievement: 11/07/19 Potential to Achieve Goals: Fair ADL Goals Pt Will Perform Eating: with set-up;bed level Pt Will Perform Upper Body Bathing: with min assist;sitting Pt Will Transfer to Toilet: with max assist;with +2 assist;bedside commode;stand pivot transfer (c LRAD PRN)  OT Frequency: Min 2X/week    AM-PAC OT "6 Clicks" Daily Activity     Outcome Measure Help from another person eating meals?: A Little Help from another person taking care of personal grooming?: A Little Help from another person toileting, which includes using toliet, bedpan, or urinal?: A Lot Help from another person bathing (including washing, rinsing, drying)?: A Lot Help from another person to put on and taking off regular upper body clothing?: A Lot Help from another person to put on and taking off regular lower body clothing?: A Lot 6 Click Score: 14   End of Session Equipment Utilized During Treatment: Gait belt;Rolling walker Nurse Communication: Mobility status  Activity Tolerance: Other (comment) (Tx limited by cognition) Patient left: in bed;with call bell/phone within reach;with bed alarm set;with family/visitor present;with nursing/sitter in room  OT Visit Diagnosis: Other abnormalities of gait and mobility (R26.89);Muscle weakness (generalized) (M62.81)  Time: 1017-5102 OT Time Calculation (min): 22 min Charges:  OT General Charges $OT Visit: 1 Visit OT Evaluation $OT Eval Moderate Complexity: 1 Mod OT Treatments $Self Care/Home Management : 8-22  mins  Dessie Coma, M.S. OTR/L  10/24/19, 1:42 PM  ascom [phone]

## 2019-10-24 NOTE — Progress Notes (Signed)
Subjective:  POD #1 s/p left hip hemiarthroplasty.   Patient has Lewy body dementia and is unable to provide an accurate history.  Objective:   VITALS:   Vitals:   10/23/19 1521 10/23/19 1531 10/23/19 2350 10/24/19 0745  BP: (!) 138/97  (!) 163/102 118/83  Pulse: 85  90 79  Resp: 16  18 17   Temp: 97.7 F (36.5 C)  98.3 F (36.8 C) 97.9 F (36.6 C)  TempSrc: Oral  Oral Oral  SpO2: (!) 85% 95% 100% 99%  Weight:      Height:        PHYSICAL EXAM: Left lower extremity Neurovascular intact Sensation intact distally Intact pulses distally Dorsiflexion/Plantar flexion intact Incision: scant drainage No cellulitis present Compartment soft  LABS  Results for orders placed or performed during the hospital encounter of 10/20/19 (from the past 24 hour(s))  CBC     Status: Abnormal   Collection Time: 10/24/19  6:04 AM  Result Value Ref Range   WBC 8.1 4.0 - 10.5 K/uL   RBC 3.57 (L) 3.87 - 5.11 MIL/uL   Hemoglobin 11.4 (L) 12.0 - 15.0 g/dL   HCT 34.1 (L) 36 - 46 %   MCV 95.5 80.0 - 100.0 fL   MCH 31.9 26.0 - 34.0 pg   MCHC 33.4 30.0 - 36.0 g/dL   RDW 13.2 11.5 - 15.5 %   Platelets 146 (L) 150 - 400 K/uL   nRBC 0.0 0.0 - 0.2 %  Basic metabolic panel     Status: Abnormal   Collection Time: 10/24/19  6:04 AM  Result Value Ref Range   Sodium 137 135 - 145 mmol/L   Potassium 3.3 (L) 3.5 - 5.1 mmol/L   Chloride 107 98 - 111 mmol/L   CO2 22 22 - 32 mmol/L   Glucose, Bld 114 (H) 70 - 99 mg/dL   BUN 10 8 - 23 mg/dL   Creatinine, Ser 0.71 0.44 - 1.00 mg/dL   Calcium 7.7 (L) 8.9 - 10.3 mg/dL   GFR calc non Af Amer >60 >60 mL/min   GFR calc Af Amer >60 >60 mL/min   Anion gap 8 5 - 15    DG Hip Port Unilat With Pelvis 1V Left  Result Date: 10/23/2019 CLINICAL DATA:  Left hip hemiarthroplasty. EXAM: DG HIP (WITH OR WITHOUT PELVIS) 1V PORT LEFT COMPARISON:  Lumbar spine 10/20/2019. FINDINGS: Left hip hemiarthroplasty. Hardware intact. Anatomic alignment. Peripheral vascular  calcification. Surgical clips right groin. IMPRESSION: Left hip hemiarthroplasty. Hardware intact. Anatomic alignment. Electronically Signed   By: Marcello Moores  Register   On: 10/23/2019 12:52    Assessment/Plan: 1 Day Post-Op   Principal Problem:   Fracture of femoral neck, left, closed ([place]) Active Problems:   Rheumatoid arthritis ([place])   HTN (hypertension), benign   Hypothyroidism   Benign meningioma of brain ([place])   Chronic atrial fibrillation ([facility])   GERD (gastroesophageal reflux disease)   [name] (coronary artery disease)   Fall   Lewy body dementia ([place])  The family would like to take the patient home upon discharge.  They have 24-hour care at home.  However the patient will still require at least a couple days of postoperative physical therapy here at [facility] before she is discharged.  Patient will need home health PT and home health aide upon discharge.  She has restarted her Xarelto today which will cover her for DVT prophylaxis.  Patient is weightbearing as tolerated on left lower extremity.  She should be discharged on  tramadol and Tylenol for pain to avoid exacerbation of her dementia.  Patient will follow up with Dr. Mack Guise in the office in 10 to 14 days after discharge.   [place] , MD 10/24/2019, 1:48 PM

## 2019-10-24 NOTE — Evaluation (Signed)
Physical Therapy Evaluation Patient Details Name: Amy Hess MRN: [id number] DOB: [birth year] Today's Date: 10/24/2019   History of Present Illness  86 y.o. female with medical history significant of Lewy body dementia, hypertension, hyperlipidemia, GERD, hypothyroidism, rheumatoid arthritis, squamous cell cancer (thyroid, mouth and tongue), [name], diverticulosis, benign brain meningioma, [name], who presents with fall and left hip fracture.  S/P total hip replacement (posterior approach) 9/30.  Clinical Impression  Pt struggled with most aspects of PT exam, she was initially asleep, but easy to rouse, however she was unable to focus at all on PT cues (exercises, mobility, precautions, etc) and generally displayed poor awareness/insight t/o the session.  Daughter stated that with her dementia she can have good and bad bouts t/o the day and stated that usually she is better able to interact and participate.  Pt completely unable to safely maintain standing, she had knee buckling and showed inability to tolerate WBing or even come close to taking even a small step in the brief bout of standing. She acknowledges that if pt does not make good improvements that going home will be difficult yet she is adamant about trying to take her mother home, they do have 24/7 supervision but typically do not have to provide very much physical assist.    Follow Up Recommendations SNF (daughter adamant bout trying to take her home)    Equipment Recommendations   (has walker, if going home may need BSC)    Recommendations for Other Services       Precautions / Restrictions Precautions Precautions: Fall;Posterior Hip Precaution Booklet Issued: Yes (comment) Precaution Comments: educated daughter, pt will poor insight at eval Restrictions Weight Bearing Restrictions: Yes LLE Weight Bearing: Weight bearing as tolerated      Mobility  Bed Mobility Overal bed mobility: Needs  Assistance Bed Mobility: Supine to Sit;Sit to Supine     Supine to sit: Max assist Sit to supine: Max assist   General bed mobility comments: Pt uanble to initiate movement toward EOB, showed no ability to assist with transitions to/from supine  Transfers Overall transfer level: Needs assistance   Transfers: Sit to/from Stand Sit to Stand: Total assist;+2 physical assistance         General transfer comment: Pt unable to initiate transition to standing, heavy assist.  She struggled to shift weight forward through UE/walker and had buckling in b/l knees with increased WBing trials.  Unable to step/unweight or heel-toe and ultimately we needed to return back to bed 2/2 safety   Ambulation/Gait             General Gait Details: unable/unsafe  Stairs            Wheelchair Mobility    Modified Rankin (Stroke Patients Only)       Balance Overall balance assessment: Needs assistance Sitting-balance support: Bilateral upper extremity supported Sitting balance-Leahy Scale: Fair       Standing balance-Leahy Scale: Zero                               Pertinent Vitals/Pain Pain Assessment: Faces Faces Pain Scale: Hurts even more Pain Location: indicates pain with all L LE tasks    Home Living Family/patient expects to be discharged to:: Private residence (PT recommending STR at time of eval, daughter opposed) Living Arrangements: Alone (24/7 assist, caregivers during day, daughter at night) Available Help at Discharge: Family   Home Access: Stairs to  enter Entrance Stairs-Rails: Right Entrance Stairs-Number of Steps: 4 Home Layout: One level Home Equipment: Walker - 2 wheels      Prior Function Level of Independence: Needs assistance   Gait / Transfers Assistance Needed: Pt able to ambulate t/o AD for in home distances, able to do some out of home activity with family  ADL's / Homemaking Assistance Needed: assist available 24/7, cognition  variable does need assist with most ADLs        Hand Dominance        Extremity/Trunk Assessment   Upper Extremity Assessment Upper Extremity Assessment: Difficult to assess due to impaired cognition    Lower Extremity Assessment Lower Extremity Assessment: Difficult to assess due to impaired cognition;LLE deficits/detail (R LE grossly 3+/5 as able to appropriately test) LLE Deficits / Details: Pt struggled to do any real active ROM on L, occasional fleeting light push response here and there       Communication   Communication:  (rambling off topic little ability to converse appropriately)  Cognition Arousal/Alertness: Awake/alert Behavior During Therapy: Impulsive Overall Cognitive Status: History of cognitive impairments - at baseline (per daughter she has good and bad bouts, more off today)                                        General Comments      Exercises Total Joint Exercises Ankle Circles/Pumps: AROM;10 reps Quad Sets: AROM;10 reps Short Arc Quad: AAROM;PROM;15 reps Heel Slides: AAROM;PROM;15 reps Hip ABduction/ADduction: AAROM;15 reps   Assessment/Plan    PT Assessment Patient needs continued PT services  PT Problem List Decreased strength;Decreased range of motion;Decreased activity tolerance;Decreased balance;Decreased mobility;Decreased coordination;Decreased cognition;Decreased knowledge of use of DME;Decreased safety awareness;Decreased knowledge of precautions;Pain       PT Treatment Interventions DME instruction;Gait training;Stair training;Functional mobility training;Therapeutic activities;Therapeutic exercise;Balance training;Neuromuscular re-education;Cognitive remediation;Patient/family education    PT Goals (Current goals can be found in the Care Plan section)  Acute Rehab PT Goals Patient Stated Goal: daughter very much wants her to go home, pt unable to state goals PT Goal Formulation: With family Time For Goal  Achievement: 11/07/19 Potential to Achieve Goals: Fair    Frequency BID   Barriers to discharge        Co-evaluation               AM-PAC PT "6 Clicks" Mobility  Outcome Measure Help needed turning from your back to your side while in a flat bed without using bedrails?: Total Help needed moving from lying on your back to sitting on the side of a flat bed without using bedrails?: Total Help needed moving to and from a bed to a chair (including a wheelchair)?: Total Help needed standing up from a chair using your arms (e.g., wheelchair or bedside chair)?: Total Help needed to walk in hospital room?: Total Help needed climbing 3-5 steps with a railing? : Total 6 Click Score: 6    End of Session Equipment Utilized During Treatment: Gait belt Activity Tolerance: Patient limited by pain (limited due to cognition) Patient left: with call bell/phone within reach;in bed (with OT)   PT Visit Diagnosis: Muscle weakness (generalized) (M62.81);Difficulty in walking, not elsewhere classified (R26.2);Pain Pain - Right/Left: Left Pain - part of body: Hip    Time: 6295-2841 PT Time Calculation (min) (ACUTE ONLY): 46 min   Charges:   PT Evaluation $PT Eval Low Complexity:  1 Low PT Treatments $Therapeutic Exercise: 8-22 mins $Therapeutic Activity: 8-22 mins        Kreg Shropshire, DPT 10/24/2019, 12:11 PM

## 2019-10-25 LAB — BASIC METABOLIC PANEL
Anion gap: 7 (ref 5–15)
BUN: 13 mg/dL (ref 8–23)
CO2: 24 mmol/L (ref 22–32)
Calcium: 8.6 mg/dL — ABNORMAL LOW (ref 8.9–10.3)
Chloride: 107 mmol/L (ref 98–111)
Creatinine, Ser: 0.74 mg/dL (ref 0.44–1.00)
GFR calc Af Amer: >60 mL/min (ref >60)
GFR calc non Af Amer: >60 mL/min (ref >60)
Glucose, Bld: 99 mg/dL (ref 70–99)
Potassium: 4 mmol/L (ref 3.5–5.1)
Sodium: 138 mmol/L (ref 135–145)

## 2019-10-25 LAB — CBC
HCT: 29.1 % — ABNORMAL LOW (ref 36.0–46.0)
Hemoglobin: 10.4 g/dL — ABNORMAL LOW (ref 12.0–15.0)
MCH: 32.5 pg (ref 26.0–34.0)
MCHC: 35.7 g/dL (ref 30.0–36.0)
MCV: 90.9 fL (ref 80.0–100.0)
Platelets: 153 10*3/uL (ref 150–400)
RBC: 3.2 MIL/uL — ABNORMAL LOW (ref 3.87–5.11)
RDW: 13.3 % (ref 11.5–15.5)
WBC: 8 10*3/uL (ref 4.0–10.5)
nRBC: 0 % (ref 0.0–0.2)

## 2019-10-25 NOTE — Progress Notes (Signed)
PT Cancellation Note  Patient Details Name: Amy Hess MRN: [id number] DOB: 11/09/1928   Cancelled Treatment:     PT attempt. Pt asleep upon entering room with supportive family member in room. Family did no want pt to be woken up and refused PT until afternoon time. Will continue efforts per POC returning at 1 pm per family request.    Willette Pa 10/25/2019, 8:09 AM

## 2019-10-25 NOTE — Plan of Care (Signed)
  Problem: Education: Goal: Knowledge of General Education information will improve Description: Including pain rating scale, medication(s)/side effects and non-pharmacologic comfort measures Outcome: Progressing   Problem: Clinical Measurements: Goal: Will remain free from infection Outcome: Progressing   Problem: Pain Management: Goal: Pain level will decrease Outcome: Progressing

## 2019-10-25 NOTE — Progress Notes (Addendum)
Physical Therapy Treatment Patient Details Name: Amy Hess MRN: [id number] DOB: 1928-04-26 Today's Date: 10/25/2019    History of Present Illness 86 y.o. female with medical history significant of Lewy body dementia, hypertension, hyperlipidemia, GERD, hypothyroidism, rheumatoid arthritis, squamous cell cancer (thyroid, mouth and tongue), [name], diverticulosis, benign brain meningioma, [name], who presents with fall and left hip fracture.  S/P total hip replacement (posterior approach) 9/30.    PT Comments    Therapist returned at requested time from family. Pt continues to be lethargic and unable to stay awake for more than 5 sec. Unable to stay awake long enough to sip drink. Per family, pt has had poor intake all day and has been asleep all day. Therapist wanted to attempt mobility to stimulate however family request letting her rest. Lengthy discussion about recommendation. Family does not want pt to go to SNF even with constant recommendation to do so. Session then progressed to family education. Lengthy education on importance of intake of fluids/food, keeping skin integrity intact/ wound prevention with wt shifting/ adjusting positioning,hip precautions, positioning when in bed/OOB, importance of performing exercises passive/active per pt abilities, and what equipment family will need at DC.   Therapist recommends mechanical lift, hospital bed, w/c, and prevalon boots for skin integrity. Acute PT will continue efforts to advance pt. PT still highly recommends DC to rehab however, per MD,plan is to DC home tomorrow per family request.     Follow Up Recommendations  SNF     Equipment Recommendations  Hospital bed;Wheelchair (measurements PT);Wheelchair cushion (measurements PT);Other (comment) (mechanical lift )    Recommendations for Other Services       Precautions / Restrictions Precautions Precautions: Fall;Posterior Hip Precaution Booklet Issued:  Yes (comment) Precaution Comments: session spent on family education. Restrictions Weight Bearing Restrictions: Yes LLE Weight Bearing: Weight bearing as tolerated    Mobility  Bed Mobility               General bed mobility comments: family requested not getting pt OOB. pt too lethargic to benefit/participate. PLan is for family to DC home tomorrow. This session focused on educating family on all needs/task care going forward  Transfers                 General transfer comment: did not perform          Cognition Arousal/Alertness: Lethargic Behavior During Therapy:  (lethargic) Overall Cognitive Status: History of cognitive impairments - at baseline            General Comments: Lewybody dementia. Pt very lethargic all day and was only physicvally awake for less than 1 minute throughout entire session.          General Comments General comments (skin integrity, edema, etc.): pt was unable to follow command to perform desired task 2/2 to lethargy. Family trained on PROM and to have pt perform AROM as able when pt is awake      Pertinent Vitals/Pain Pain Location:  (pt occasional moans/groans with LLE passisve movement)           PT Goals (current goals can now be found in the care plan section) Acute Rehab PT Goals Patient Stated Goal: daughter is planning to take pt home against medical recommendation Progress towards PT goals: Not progressing toward goals - comment (pt is not progressing. Lethargy, family and cognition limiti)    Frequency    BID      PT Plan Current plan remains appropriate  AM-PAC PT "6 Clicks" Mobility   Outcome Measure  Help needed turning from your back to your side while in a flat bed without using bedrails?: Total Help needed moving from lying on your back to sitting on the side of a flat bed without using bedrails?: Total Help needed moving to and from a bed to a chair (including a wheelchair)?: Total Help  needed standing up from a chair using your arms (e.g., wheelchair or bedside chair)?: Total Help needed to walk in hospital room?: Total Help needed climbing 3-5 steps with a railing? : Total 6 Click Score: 6    End of Session Equipment Utilized During Treatment: Other (comment) Activity Tolerance: Patient limited by lethargy Patient left: with call bell/phone within reach;with bed alarm set;with family/visitor present Nurse Communication: Mobility status;Need for lift equipment;Weight bearing status;Precautions PT Visit Diagnosis: Muscle weakness (generalized) (M62.81);Difficulty in walking, not elsewhere classified (R26.2);Pain Pain - Right/Left: Left Pain - part of body: Hip     Time: 1368-5992 PT Time Calculation (min) (ACUTE ONLY): 54 min  Charges:  $Therapeutic Activity: 53-67 mins                     Julaine Fusi PTA 10/25/19, 2:41 PM

## 2019-10-25 NOTE — Progress Notes (Signed)
Pt family would like to have pt placed on Iv fluids. Spoke with the nurse practitioner Lang Snow. No iv fluids at this time.

## 2019-10-25 NOTE — Progress Notes (Signed)
Subjective: 2 Days Post-Op    The patient is sleeping today.  Her daughter is at the bedside.  She has not been up today but was yesterday apparently.  Her daughter plans to take her home with help and home health PT.  Hemoglobin is stable.   Patient reports pain as mild.  Objective:   VITALS:   Vitals:   10/24/19 2258 10/25/19 0916  BP:  (!) 152/87  Pulse: 64 100  Resp:  18  Temp: 97.9 F (36.6 C) 98.7 F (37.1 C)  SpO2: 97% 99%    Neurovascular intact Intact pulses distally  Dressing is dry.  There is no significant swelling in the leg. LABS Recent Labs    10/24/19 0604 10/25/19 0408  HGB 11.4* 10.4*  HCT 34.1* 29.1*  WBC 8.1 8.0  PLT 146* 153    Recent Labs    10/24/19 0604 10/25/19 0408  NA 137 138  K 3.3* 4.0  BUN 10 13  CREATININE 0.71 0.74  GLUCOSE 114* 99    No results for input(s): LABPT, INR in the last 72 hours.   Assessment/Plan: 2 Days Post-Op Procedure(s) (LRB): ARTHROPLASTY BIPOLAR HIP (HEMIARTHROPLASTY) (Left)   Advance diet Up with therapy D/C IV fluids Discharge home with home health.  She may take a couple days to stabilize.  Return to clinic in 10 days to 2 weeks to see Dr. Mack Guise.  Discharge on Xarelto as prior to hospitalization.

## 2019-10-25 NOTE — Progress Notes (Signed)
PROGRESS NOTE    Amy Hess  WRU:[id number] DOB: 11/14/1928 DOA: 10/20/2019 PCP: Kirk Ruths, MD    Brief Narrative:  Amy Stech Stubblefieldis a 86 y.o.femalewith medical history significant ofLewy body dementia, hypertension, hyperlipidemia, GERD, hypothyroidism, rheumatoid arthritis, squamous cell cancer (thyroid, mouth and tongue), [name], diverticulosis, benign brain meningioma, [name], who presents with fall and left hip pain.  10/1-daughter at bedside adamant that patient will not need to go to rehab and wants her mom to go home with home PT.  10/2-per PT note today, Family did not want pt to be woken up for PT and refused PT until afternoon time.   Consultants:   Orthopedics  Procedures:   Antimicrobials:       Subjective: Pt responds to my questions, but keeps eyes closed. At baseline MS. No acute issues reported.   Objective: Vitals:   10/24/19 2242 10/24/19 2245 10/24/19 2258 10/25/19 0916  BP: (!) 139/96 (!) 139/96  (!) 152/87  Pulse: 95 95 64 100  Resp:  18  18  Temp:   97.9 F (36.6 C) 98.7 F (37.1 C)  TempSrc:   Oral Axillary  SpO2:   97% 99%  Weight:      Height:        Intake/Output Summary (Last 24 hours) at 10/25/2019 1333 Last data filed at 10/24/2019 1846 Gross per 24 hour  Intake --  Output 520 ml  Net -520 ml   Filed Weights   10/20/19 1214  Weight: 45.4 kg    Examination:  Comfortable, NAD, pleasant at baseline MS CTA, no wheeze rales rhonchi's RRR S1-S2 no murmurs rubs gallops Soft benign positive bowel sounds nontender No edema or cyanosis Mood and affect appropriate in current setting and at baseline   Data Reviewed: I have personally reviewed following labs and imaging studies  CBC: Recent Labs  Lab 10/20/19 1218 10/21/19 0642 10/22/19 0543 10/24/19 0604 10/25/19 0408  WBC 7.7 7.5 7.1 8.1 8.0  NEUTROABS 5.8  --   --   --   --   HGB 13.6 13.2 12.4 11.4* 10.4*  HCT 40.2 38.2  37.5 34.1* 29.1*  MCV 93.3 92.0 95.2 95.5 90.9  PLT 148* 141* 149* 146* 914   Basic Metabolic Panel: Recent Labs  Lab 10/20/19 1218 10/21/19 0642 10/22/19 0543 10/24/19 0604 10/25/19 0408  NA 140 139 141 137 138  K 3.5 3.8 3.6 3.3* 4.0  CL 102 103 107 107 107  CO2 28 27 23 22 24   GLUCOSE 109* 101* 80 114* 99  BUN 18 16 20 10 13   CREATININE 0.94 0.78 1.09* 0.71 0.74  CALCIUM 8.9 8.6* 8.2* 7.7* 8.6*   GFR: Estimated Creatinine Clearance: 34.2 mL/min (by C-G formula based on SCr of 0.74 mg/dL). Liver Function Tests: Recent Labs  Lab 10/20/19 1218  AST 29  ALT 20  ALKPHOS 45  BILITOT 1.6*  PROT 6.8  ALBUMIN 4.1   No results for input(s): LIPASE, AMYLASE in the last 168 hours. No results for input(s): AMMONIA in the last 168 hours. Coagulation Profile: Recent Labs  Lab 10/20/19 1505  INR 1.3*   Cardiac Enzymes: No results for input(s): CKTOTAL, CKMB, CKMBINDEX, TROPONINI in the last 168 hours. BNP (last 3 results) No results for input(s): PROBNP in the last 8760 hours. HbA1C: No results for input(s): HGBA1C in the last 72 hours. CBG: No results for input(s): GLUCAP in the last 168 hours. Lipid Profile: No results for input(s): CHOL, HDL, LDLCALC, TRIG,  CHOLHDL, LDLDIRECT in the last 72 hours. Thyroid Function Tests: No results for input(s): TSH, T4TOTAL, FREET4, T3FREE, THYROIDAB in the last 72 hours. Anemia Panel: No results for input(s): VITAMINB12, FOLATE, FERRITIN, TIBC, IRON, RETICCTPCT in the last 72 hours. Sepsis Labs: No results for input(s): PROCALCITON, LATICACIDVEN in the last 168 hours.  Recent Results (from the past 240 hour(s))  Respiratory Panel by RT PCR (Flu A&B, Covid) - Nasopharyngeal Swab     Status: None   Collection Time: 10/20/19 12:18 PM   Specimen: Nasopharyngeal Swab  Result Value Ref Range Status   SARS Coronavirus 2 by RT PCR NEGATIVE NEGATIVE Final    Comment: (NOTE) SARS-CoV-2 target nucleic acids are NOT DETECTED.  The  SARS-CoV-2 RNA is generally detectable in upper respiratoy specimens during the acute phase of infection. The lowest concentration of SARS-CoV-2 viral copies this assay can detect is 131 copies/mL. A negative result does not preclude SARS-Cov-2 infection and should not be used as the sole basis for treatment or other patient management decisions. A negative result may occur with  improper specimen collection/handling, submission of specimen other than nasopharyngeal swab, presence of viral mutation(s) within the areas targeted by this assay, and inadequate number of viral copies (<131 copies/mL). A negative result must be combined with clinical observations, patient history, and epidemiological information. The expected result is Negative.  Fact Sheet for Patients:  PinkCheek.be  Fact Sheet for Healthcare Providers:  GravelBags.it  This test is no t yet approved or cleared by the [place] FDA and  has been authorized for detection and/or diagnosis of SARS-CoV-2 by FDA under an Emergency Use Authorization (EUA). This EUA will remain  in effect (meaning this test can be used) for the duration of the COVID-19 declaration under Section 564(b)(1) of the Act, 21 U.S.C. section 360bbb-3(b)(1), unless the authorization is terminated or revoked sooner.     Influenza A by PCR NEGATIVE NEGATIVE Final   Influenza B by PCR NEGATIVE NEGATIVE Final    Comment: (NOTE) The Xpert Xpress SARS-CoV-2/FLU/RSV assay is intended as an aid in  the diagnosis of influenza from Nasopharyngeal swab specimens and  should not be used as a sole basis for treatment. Nasal washings and  aspirates are unacceptable for Xpert Xpress SARS-CoV-2/FLU/RSV  testing.  Fact Sheet for Patients: PinkCheek.be  Fact Sheet for Healthcare Providers: GravelBags.it  This test is not yet approved or cleared  by the [place] FDA and  has been authorized for detection and/or diagnosis of SARS-CoV-2 by  FDA under an Emergency Use Authorization (EUA). This EUA will remain  in effect (meaning this test can be used) for the duration of the  Covid-19 declaration under Section 564(b)(1) of the Act, 21  U.S.C. section 360bbb-3(b)(1), unless the authorization is  terminated or revoked. Performed at [facility], [address], [place], [place] [address]          Radiology Studies: No results found.      Scheduled Meds: . amLODipine  10 mg Oral Daily  . calcium-vitamin D  1 tablet Oral Daily  . carvedilol  3.125 mg Oral BID  . Chlorhexidine Gluconate Cloth  6 each Topical Daily  . docusate sodium  100 mg Oral BID  . famotidine  10 mg Oral QHS  . folic acid  1 mg Oral Daily  . levothyroxine  75 mcg Oral QAC breakfast  . loratadine  10 mg Oral Daily  . melatonin  2.5 mg Oral QHS  . memantine  10  mg Oral BID  . multivitamin with minerals  1 tablet Oral Daily  . polyethylene glycol  17 g Oral Daily  . predniSONE  10 mg Oral Q breakfast  . QUEtiapine  25 mg Oral BID  . rivaroxaban  15 mg Oral Q supper  . senna  1 tablet Oral BID  . torsemide  5 mg Oral Daily  . traMADol  50 mg Oral Q6H   Continuous Infusions:   Assessment & Plan:   Principal Problem:   Fracture of femoral neck, left, closed (HCC) Active Problems:   Rheumatoid arthritis (HCC)   HTN (hypertension), benign   Hypothyroidism   Benign meningioma of brain (HCC)   Chronic atrial fibrillation (HCC)   GERD (gastroesophageal reflux disease)   [name] (coronary artery disease)   Fall   Lewy body dementia ([place])   Fracture of femoral neck, left, closed [facility]): Status post left hip hemiarthroplasty  PT recommends SNF -however patient's daughter refuses patient to go to rehab and wants to take patient home.  -On Xarelto which covers for DVT prophylaxis  Weightbearing as tolerated on the left lower extremity   She will be discharged on tramadol and Tylenol for pain to avoid exacerbation of dementia per Ortho's request Follow-up with Dr. Mack Guise in 10 to 14 days after discharge Home health PT and aide Continue bowel regimen and pain management here    HTN (hypertension), benign Stable Continue torsemide and Coreg    Urinary retention-postoperatively Currently doing bladder scan and in and out cath  We will monitor to see if she needs a Foley placement     Hypothyroidism New Synthroid    Benign meningioma of brain ([place]) Needs to follow-up with neurosurgery as outpatient     Chronic atrial fibrillation ([place]) Continue Coreg, and Xarelto    GERD (gastroesophageal reflux disease) -Continue Pepcid   [name] (coronary artery disease) -Continue Coreg  Fall PT/OT recommended SNF  Lewy body dementia: -[place]  Rheumatoid arthritis: -Prednisone was increased from 5-10, given stress dose steroids  We will decrease to home dose upon discharge     DVT prophylaxis: Xarelto Code Status: DNR Family Communication: None at bedside Status is: Inpatient  Remains inpatient appropriate because:Unsafe d/c plan   Dispo: The patient is from: Home              Anticipated d/c is to: Home              Anticipated d/c date is: 1 day              Patient currently is medically stable to d/c.Needs PT per orthopedics for 2 days before going home post op. Plan to dc tomorrow if cleared by orthopedics            LOS: 5 days   Time spent: 45 min with >50% on coc    Nolberto Hanlon, MD [facility] Pager [phone]  If 7PM-7AM, please contact night-coverage [email or web address] Password [email or web address] 10/25/2019, 1:33 PM

## 2019-10-25 NOTE — TOC Progression Note (Signed)
Transition of Care [facility]) - Progression Note    Patient Details  Name: LACARA DUNSWORTH MRN: [id number] Date of Birth: [birth year]  [facility] CM/SW Contact  Boris Sharper, LCSW Phone Number: 10/25/2019, 3:26 PM  Clinical Narrative:    Pt's hospital bed, wheelchair and hoyer lift from [employer]. Pt wants HH with [facility], CSW called Sheryl with [facility] and she was about to accept pt for  [facility] PT.   Expected Discharge Plan: [place] Barriers to Discharge: Equipment Delay  Expected Discharge Plan and Services Expected Discharge Plan: [place] In-house Referral: Clinical Social Work   Post Acute Care Choice: [place] arrangements for the past 2 months: [place]                 DME Arranged: Hospital bed, Lightweight manual wheelchair with seat cushion DME Agency: [employer] Date DME Agency Contacted: 10/25/19 Time DME Agency Contacted: 1522 Representative spoke with at DME Agency: [place]: PT [place]: [place] Date [place]: 10/25/19 Time [place]: 108 Representative spoke with at [place]: [place] ([place]) Interventions    Readmission Risk Interventions No flowsheet data found.

## 2019-10-26 LAB — POTASSIUM: Potassium: 3.2 mmol/L — ABNORMAL LOW (ref 3.5–5.1)

## 2019-10-26 LAB — CBC
HCT: 27.1 % — ABNORMAL LOW (ref 36.0–46.0)
Hemoglobin: 9.6 g/dL — ABNORMAL LOW (ref 12.0–15.0)
MCH: 32 pg (ref 26.0–34.0)
MCHC: 35.4 g/dL (ref 30.0–36.0)
MCV: 90.3 fL (ref 80.0–100.0)
Platelets: 179 10*3/uL (ref 150–400)
RBC: 3 MIL/uL — ABNORMAL LOW (ref 3.87–5.11)
RDW: 13.5 % (ref 11.5–15.5)
WBC: 6.7 10*3/uL (ref 4.0–10.5)
nRBC: 0 % (ref 0.0–0.2)

## 2019-10-26 MED ORDER — SENNA 8.6 MG PO TABS: 1.0000 | ORAL_TABLET | Freq: Two times a day (BID) | ORAL | 0 refills | Status: AC

## 2019-10-26 MED ORDER — RIVAROXABAN 15 MG PO TABS: 15.0000 mg | ORAL_TABLET | Freq: Every day | ORAL | 0 refills | Status: AC

## 2019-10-26 MED ORDER — DOCUSATE SODIUM 100 MG PO CAPS: 100.0000 mg | ORAL_CAPSULE | Freq: Two times a day (BID) | ORAL | 0 refills | Status: AC

## 2019-10-26 MED ORDER — DICLOFENAC SODIUM 1 % EX GEL: 2.0000 g | Freq: Three times a day (TID) | CUTANEOUS | 0 refills | Status: AC | PRN

## 2019-10-26 MED ORDER — TRAMADOL HCL 50 MG PO TABS
50.0000 mg | ORAL_TABLET | Freq: Two times a day (BID) | ORAL | 0 refills | Status: AC | PRN
Start: 2019-10-26 — End: ?

## 2019-10-26 MED ORDER — POTASSIUM CHLORIDE CRYS ER 20 MEQ PO TBCR
40.0000 meq | EXTENDED_RELEASE_TABLET | Freq: Once | ORAL | Status: AC
  Administered 2019-10-26: 40 meq via ORAL
  Filled 2019-10-26: qty 2

## 2019-10-26 MED ORDER — POLYETHYLENE GLYCOL 3350 17 G PO PACK: 17.0000 g | PACK | Freq: Every day | ORAL | 0 refills | Status: AC

## 2019-10-26 MED ORDER — FAMOTIDINE 20 MG PO TABS: 20.0000 mg | ORAL_TABLET | Freq: Every day | ORAL | 0 refills | Status: AC

## 2019-10-26 MED ORDER — METHOCARBAMOL 500 MG PO TABS: 500.0000 mg | ORAL_TABLET | Freq: Three times a day (TID) | ORAL | 0 refills | Status: AC | PRN

## 2019-10-26 NOTE — Anesthesia Postprocedure Evaluation (Signed)
Anesthesia Post Note  Patient: Amy Hess  Procedure(s) Performed: ARTHROPLASTY BIPOLAR HIP (HEMIARTHROPLASTY) (Left Hip)  Patient location during evaluation: PACU Anesthesia Type: Regional Level of consciousness: awake and alert Pain management: pain level controlled Vital Signs Assessment: post-procedure vital signs reviewed and stable Respiratory status: spontaneous breathing, nonlabored ventilation, respiratory function stable and patient connected to nasal cannula oxygen Cardiovascular status: blood pressure returned to baseline and stable Postop Assessment: no apparent nausea or vomiting Anesthetic complications: no   No complications documented.   Last Vitals:  Vitals:   10/25/19 2052 10/25/19 2329  BP: (!) 145/99 109/66  Pulse: 99 88  Resp:  18  Temp:  36.8 C  SpO2:  98%    Last Pain:  Vitals:   10/26/19 0428  TempSrc:   PainSc: [place] Akilah Cure[place]

## 2019-10-26 NOTE — Discharge Summary (Signed)
Amy Hess IWL:[id number] DOB: Jun 03, 1928 DOA: 10/20/2019  PCP: Kirk Ruths, MD  Admit date: 10/20/2019 Discharge date: 10/26/2019  Admitted From: Home Disposition:  Home  Recommendations for Outpatient Follow-up:  1. Follow up with PCP in 1 week 2. Please obtain BMP/CBC in one week 3. Dr. Zandra Abts in 10 days  Home Health:yes    Discharge Condition:Stable CODE STATUS:DNR  Diet recommendation: Regular  Brief/Interim Summary: Amy Hess a 86 y.o.femalewith medical history significant ofLewy body dementia, hypertension, hyperlipidemia, GERD, hypothyroidism, rheumatoid arthritis, squamous cell cancer (thyroid, mouth and tongue), [name], diverticulosis, benign brain meningioma, [name], who presents with fall and left hip pain  Fracture of femoral neck, left, closed [facility]): Status post left hip hemiarthroplasty  PT recommends SNF -however patient's daughter refused patient to go to rehab and wanted to take patient home.  -On Xarelto which covers for DVT prophylaxis  Weightbearing as tolerated on the left lower extremity  Follow-up with Dr. Mack Guise in 10 to 14 days after discharge Home health ordered We kept her 2 days postop to get physical therapy per Ortho's request before discharging her home. Daughter is ready to take her home today and I cleared it with orthopedics Dr. Sabra Heck.    HTN (hypertension), benign Stable Continue home meds   Urinary retention-postoperatively Improved, and now has been voiding    Hypothyroidism Continue synthroid   Benign meningioma of brain ([place]) Needs to follow-up with neurosurgery as outpatient     Chronic atrial fibrillation (HCC) Continue Coreg, and Xarelto    GERD (gastroesophageal reflux disease) -Continue Pepcid   [name] (coronary artery disease) -Continue Coreg  Fall PT/OT recommended SNF- family wanted patient to be taken home  Lewy body  dementia: -[place]  Rheumatoid arthritis: -Prednisone was increased from 5-10, given stress dose steroids  Resume home dose on discharge  GI PPX-given pecid   Discharge Diagnoses:  Principal Problem:   Fracture of femoral neck, left, closed ([place]) Active Problems:   Rheumatoid arthritis ([place])   HTN (hypertension), benign   Hypothyroidism   Benign meningioma of brain ([place])   Chronic atrial fibrillation (HCC)   GERD (gastroesophageal reflux disease)   [name] (coronary artery disease)   Fall   Lewy body dementia [facility])    Discharge Instructions  Discharge Instructions    Call MD for:  severe uncontrolled pain   Complete by: As directed    Diet - low sodium heart healthy   Complete by: As directed    Discharge instructions   Complete by: As directed    F/u with Dr. Zandra Abts in 10 days   Increase activity slowly   Complete by: As directed    No wound care   Complete by: As directed      Allergies as of 10/26/2019      Reactions   Dyazide [hydrochlorothiazide W-triamterene] Other (See Comments)   On Chart ot MD office   Plaquenil [hydroxychloroquine Sulfate] Other (See Comments)   Not sure per MD office on chart   Sulfate Other (See Comments)   Pt not sure   Codeine Rash      Medication List    STOP taking these medications   celecoxib 200 MG capsule Commonly known as: CELEBREX   ranitidine 150 MG tablet Commonly known as: ZANTAC     TAKE these medications   acetaminophen 500 MG tablet Commonly known as: TYLENOL Take 1,000 mg by mouth every 6 (six) hours as needed.   alendronate 70 MG tablet Commonly known  as: FOSAMAX Take 70 mg by mouth once a week. ** Tuesdays Take with a full glass of water on an empty stomach.   Biotin 1000 MCG tablet Take 1,000 mcg by mouth daily.   CALCIUM 600 + D PO Take 1 tablet by mouth daily.   carvedilol 3.125 MG tablet Commonly known as: COREG Take 1 tablet by mouth 2 (two) times daily.   cetirizine 10 MG  tablet Commonly known as: ZYRTEC Take 10 mg by mouth at bedtime.   diclofenac Sodium 1 % Gel Commonly known as: VOLTAREN Apply 2 g topically 3 (three) times daily as needed (to l hip as needed for pain).   docusate sodium 100 MG capsule Commonly known as: COLACE Take 1 capsule (100 mg total) by mouth 2 (two) times daily.   famotidine 20 MG tablet Commonly known as: PEPCID Take 1 tablet (20 mg total) by mouth daily.   Flaxseed Oil 1000 MG Caps Take 1 capsule by mouth daily.   folic acid 1 MG tablet Commonly known as: FOLVITE Take 1 mg by mouth daily.   Klor-Con M10 10 MEQ tablet Generic drug: potassium chloride Take 10 mEq by mouth daily.   levothyroxine 75 MCG tablet Commonly known as: SYNTHROID Take 75 mcg by mouth daily before breakfast.   melatonin 3 MG Tabs tablet Take 3 mg by mouth at bedtime.   memantine 10 MG tablet Commonly known as: NAMENDA Take 10 mg by mouth 2 (two) times daily.   methocarbamol 500 MG tablet Commonly known as: ROBAXIN Take 1 tablet (500 mg total) by mouth every 8 (eight) hours as needed for muscle spasms.   multivitamin capsule Take 1 capsule by mouth daily.   polyethylene glycol 17 g packet Commonly known as: MIRALAX / GLYCOLAX Take 17 g by mouth daily.   predniSONE 5 MG tablet Commonly known as: DELTASONE Take 5 mg by mouth daily with breakfast.   QUEtiapine 25 MG tablet Commonly known as: SEROQUEL Take 25 mg by mouth 2 (two) times daily.   Rivaroxaban 15 MG Tabs tablet Commonly known as: XARELTO Take 1 tablet (15 mg total) by mouth daily with supper. What changed:   medication strength  how much to take   senna 8.6 MG Tabs tablet Commonly known as: SENOKOT Take 1 tablet (8.6 mg total) by mouth 2 (two) times daily for 5 days.   torsemide 5 MG tablet Commonly known as: DEMADEX Take 5 mg by mouth at bedtime.   traMADol 50 MG tablet Commonly known as: ULTRAM Take 1 tablet (50 mg total) by mouth every 12 (twelve)  hours as needed for up to 10 doses for severe pain. What changed:   how much to take  when to take this  reasons to take this            Durable Medical Equipment  (From admission, onward)         Start     Ordered   10/25/19 1448  For home use only DME Other see comment  Once       Comments: Mechanical lift  Question:  Length of Need  Answer:  6 Months   10/25/19 1447   10/25/19 1446  For home use only DME lightweight manual wheelchair with seat cushion  Once       Comments: Patient suffers from s/p hip surgery which impairs their ability to perform daily activities in the home.  A {walking ZOX:WRUE not resolve  issue with performing activities of daily living.  A wheelchair will allow patient to safely perform daily activities. Patient is not able to propel themselves in the home using a standard weight wheelchair due to {weakness:Patient can self propel in the lightweight wheelchair. Length of need 1 month Accessories: elevating leg rests (ELRs), wheel locks, extensions and anti-tippers.   10/25/19 1446   10/25/19 1444  For home use only DME Hospital bed  Once       Question Answer Comment  Length of Need 6 Months   The above medical condition requires: Patient requires the ability to reposition frequently   Bed type Semi-electric   Hoyer Lift Yes   Support Surface: Gel Overlay      10/25/19 1446   10/25/19 1354  For home use only DME Bedside commode  Once       Comments: 3 in 1  Question:  Patient needs a bedside commode to treat with the following condition  Answer:  Fracture   10/25/19 1353   10/24/19 1354  For home use only DME wheelchair cushion (seat and back)  Once        10/24/19 1353   10/24/19 1352  For home use only DME Walker rolling  Once       Question Answer Comment  Walker: With [place]   Patient needs a walker to treat with the following condition Fall      10/24/19 1352          Allergies  Allergen Reactions  . Dyazide  [Hydrochlorothiazide W-Triamterene] Other (See Comments)    On Chart ot MD office  . Plaquenil [Hydroxychloroquine Sulfate] Other (See Comments)    Not sure per MD office on chart   . Sulfate Other (See Comments)    Pt not sure  . Codeine Rash    Consultations:  Orthopedics   Procedures/Studies: DG Chest 1 View  Result Date: 10/20/2019 CLINICAL DATA:  Unwitnessed fall. EXAM: CHEST  1 VIEW COMPARISON:  None. FINDINGS: Mild cardiomegaly is noted. No pneumothorax or pleural effusion is noted. Both lungs are clear. The visualized skeletal structures are unremarkable. IMPRESSION: No active disease. Electronically Signed   By: Marijo Conception M.D.   On: 10/20/2019 12:45   DG Thoracic Spine 2 View  Result Date: 10/20/2019 CLINICAL DATA:  Fall. EXAM: THORACIC SPINE 2 VIEWS COMPARISON:  No recent prior. FINDINGS: Thoracolumbar spine scoliosis and degenerative change. No acute bony abnormality identified. Mitral annular calcification noted. Biapical pleuroparenchymal thickening most consistent with scarring. Surgical clips right upper quadrant. IMPRESSION: Thoracolumbar spine scoliosis and degenerative change. No acute abnormality identified. Electronically Signed   By: Marcello Moores  Register   On: 10/20/2019 13:11   DG Lumbar Spine 2-3 Views  Result Date: 10/20/2019 CLINICAL DATA:  Fall sustaining a left femoral neck fracture. EXAM: LUMBAR SPINE - 2-3 VIEW COMPARISON:  CT abdomen and pelvis 06/09/2014 FINDINGS: There are 5 non rib-bearing lumbar type vertebrae. There is mild-to-moderate lumbar levoscoliosis with apex at L2-3. There is no significant listhesis. No lumbar spine fracture is identified. There is moderate asymmetric disc space narrowing on the right at L2-3 and L3-4 and on the left at L4-5. Cholecystectomy clips and aortic atherosclerosis are noted. IMPRESSION: Multilevel lumbar disc degeneration without an acute osseous abnormality identified. Electronically Signed   By: Logan Bores M.D.    On: 10/20/2019 13:11   CT Head Wo Contrast  Result Date: 10/20/2019 CLINICAL DATA:  Fall sustaining a left femoral neck fracture. EXAM: CT HEAD WITHOUT CONTRAST CT CERVICAL SPINE WITHOUT CONTRAST  TECHNIQUE: Multidetector CT imaging of the head and cervical spine was performed following the standard protocol without intravenous contrast. Multiplanar CT image reconstructions of the cervical spine were also generated. COMPARISON:  Head MRI 12/02/2012.  Neck CT 05/09/2012. FINDINGS: CT HEAD FINDINGS Brain: No acute infarct, intracranial hemorrhage, midline shift, or extra-axial fluid collection is identified. Mild cerebral atrophy is within normal limits for age. Patchy hypodensities in the cerebral white matter bilaterally are nonspecific but compatible with moderate chronic small vessel ischemic disease which has likely progressed from the prior MRI. A chronic lacunar infarct in the right caudate nucleus is new. A partially calcified extra-axial mass laterally over the left cerebral convexity at the level of the operculum has enlarged and now measures 20 x 8 mm. A 13 x 7 mm extra-axial mass along the posterior aspect of the right temporal bone posterior and superior to the internal auditory canal has not significantly changed in size. Vascular: Calcified atherosclerosis at the skull base. No hyperdense vessel. Skull: No fracture or suspicious osseous lesion. Sinuses/Orbits: Visualized paranasal sinuses and mastoid air cells are clear. Visualized orbits are unremarkable. Other: None. CT CERVICAL SPINE FINDINGS Alignment: Slight reversal of the normal cervical lordosis with 2 mm anterolisthesis of C4 on C5 and 2 mm retrolisthesis of C5 on C6. Skull base and vertebrae: No acute fracture or suspicious osseous lesion. Moderate median C1-2 arthropathy. Soft tissues and spinal canal: No prevertebral fluid or swelling. No visible canal hematoma. Disc levels: Moderate disc space narrowing and degenerative endplate changes  at A8-3 and C6-7 with uncovertebral spurring resulting in mild neural foraminal stenosis. No evidence of significant spinal stenosis. Mild left facet arthrosis at C3-4 and C4-5. Upper chest: Biapical pleuroparenchymal lung scarring. Other: Heavily calcified atherosclerotic plaque at the left greater than right carotid bifurcations. IMPRESSION: 1. No evidence of acute intracranial abnormality. 2. Moderate chronic small vessel ischemic disease. 3. Interval enlargement of left cerebral convexity meningioma. 4. Unchanged posterior fossa meningioma. 5. No evidence of acute cervical spine fracture. Electronically Signed   By: Logan Bores M.D.   On: 10/20/2019 13:22   CT Cervical Spine Wo Contrast  Result Date: 10/20/2019 CLINICAL DATA:  Fall sustaining a left femoral neck fracture. EXAM: CT HEAD WITHOUT CONTRAST CT CERVICAL SPINE WITHOUT CONTRAST TECHNIQUE: Multidetector CT imaging of the head and cervical spine was performed following the standard protocol without intravenous contrast. Multiplanar CT image reconstructions of the cervical spine were also generated. COMPARISON:  Head MRI 12/02/2012.  Neck CT 05/09/2012. FINDINGS: CT HEAD FINDINGS Brain: No acute infarct, intracranial hemorrhage, midline shift, or extra-axial fluid collection is identified. Mild cerebral atrophy is within normal limits for age. Patchy hypodensities in the cerebral white matter bilaterally are nonspecific but compatible with moderate chronic small vessel ischemic disease which has likely progressed from the prior MRI. A chronic lacunar infarct in the right caudate nucleus is new. A partially calcified extra-axial mass laterally over the left cerebral convexity at the level of the operculum has enlarged and now measures 20 x 8 mm. A 13 x 7 mm extra-axial mass along the posterior aspect of the right temporal bone posterior and superior to the internal auditory canal has not significantly changed in size. Vascular: Calcified  atherosclerosis at the skull base. No hyperdense vessel. Skull: No fracture or suspicious osseous lesion. Sinuses/Orbits: Visualized paranasal sinuses and mastoid air cells are clear. Visualized orbits are unremarkable. Other: None. CT CERVICAL SPINE FINDINGS Alignment: Slight reversal of the normal cervical lordosis with 2 mm anterolisthesis of C4  on C5 and 2 mm retrolisthesis of C5 on C6. Skull base and vertebrae: No acute fracture or suspicious osseous lesion. Moderate median C1-2 arthropathy. Soft tissues and spinal canal: No prevertebral fluid or swelling. No visible canal hematoma. Disc levels: Moderate disc space narrowing and degenerative endplate changes at F0-9 and C6-7 with uncovertebral spurring resulting in mild neural foraminal stenosis. No evidence of significant spinal stenosis. Mild left facet arthrosis at C3-4 and C4-5. Upper chest: Biapical pleuroparenchymal lung scarring. Other: Heavily calcified atherosclerotic plaque at the left greater than right carotid bifurcations. IMPRESSION: 1. No evidence of acute intracranial abnormality. 2. Moderate chronic small vessel ischemic disease. 3. Interval enlargement of left cerebral convexity meningioma. 4. Unchanged posterior fossa meningioma. 5. No evidence of acute cervical spine fracture. Electronically Signed   By: Logan Bores M.D.   On: 10/20/2019 13:22   DG Hip Port Unilat With Pelvis 1V Left  Result Date: 10/23/2019 CLINICAL DATA:  Left hip hemiarthroplasty. EXAM: DG HIP (WITH OR WITHOUT PELVIS) 1V PORT LEFT COMPARISON:  Lumbar spine 10/20/2019. FINDINGS: Left hip hemiarthroplasty. Hardware intact. Anatomic alignment. Peripheral vascular calcification. Surgical clips right groin. IMPRESSION: Left hip hemiarthroplasty. Hardware intact. Anatomic alignment. Electronically Signed   By: Marcello Moores  Register   On: 10/23/2019 12:52       Subjective: Daughter stated patient was moving her leg today as she took a video of it was showing me.  With the  leg where she had the hip repair.  Also daughter reported patient has been drinking and eating breakfast today.  Discharge Exam: Vitals:   10/25/19 2329 10/26/19 1144  BP: 109/66 97/63  Pulse: 88 82  Resp: 18 15  Temp: 98.3 F (36.8 C) 97.8 F (36.6 C)  SpO2: 98% 100%   Vitals:   10/25/19 1453 10/25/19 2052 10/25/19 2329 10/26/19 1144  BP: 129/73 (!) 145/99 109/66 97/63  Pulse: 88 99 88 82  Resp: 16  18 15   Temp: 98.6 F (37 C)  98.3 F (36.8 C) 97.8 F (36.6 C)  TempSrc: Axillary  Axillary   SpO2: 100%  98% 100%  Weight:      Height:        General: Pt is eyes closed but answers questions, calm comfortable, not in acute distress Cardiovascular: RRR, S1/S2 +, no rubs, no gallops Respiratory: CTA bilaterally, no wheezing, no rhonchi Abdominal: Soft, NT, ND, bowel sounds + Extremities: no edema, no cyanosis    The results of significant diagnostics from this hospitalization (including imaging, microbiology, ancillary and laboratory) are listed below for reference.     Microbiology: Recent Results (from the past 240 hour(s))  Respiratory Panel by RT PCR (Flu A&B, Covid) - Nasopharyngeal Swab     Status: None   Collection Time: 10/20/19 12:18 PM   Specimen: Nasopharyngeal Swab  Result Value Ref Range Status   SARS Coronavirus 2 by RT PCR NEGATIVE NEGATIVE Final    Comment: (NOTE) SARS-CoV-2 target nucleic acids are NOT DETECTED.  The SARS-CoV-2 RNA is generally detectable in upper respiratoy specimens during the acute phase of infection. The lowest concentration of SARS-CoV-2 viral copies this assay can detect is 131 copies/mL. A negative result does not preclude SARS-Cov-2 infection and should not be used as the sole basis for treatment or other patient management decisions. A negative result may occur with  improper specimen collection/handling, submission of specimen other than nasopharyngeal swab, presence of viral mutation(s) within the areas targeted by this  assay, and inadequate number of viral copies (<131 copies/mL).  A negative result must be combined with clinical observations, patient history, and epidemiological information. The expected result is Negative.  Fact Sheet for Patients:  PinkCheek.be  Fact Sheet for Healthcare Providers:  GravelBags.it  This test is no t yet approved or cleared by the [place] FDA and  has been authorized for detection and/or diagnosis of SARS-CoV-2 by FDA under an Emergency Use Authorization (EUA). This EUA will remain  in effect (meaning this test can be used) for the duration of the COVID-19 declaration under Section 564(b)(1) of the Act, 21 U.S.C. section 360bbb-3(b)(1), unless the authorization is terminated or revoked sooner.     Influenza A by PCR NEGATIVE NEGATIVE Final   Influenza B by PCR NEGATIVE NEGATIVE Final    Comment: (NOTE) The Xpert Xpress SARS-CoV-2/FLU/RSV assay is intended as an aid in  the diagnosis of influenza from Nasopharyngeal swab specimens and  should not be used as a sole basis for treatment. Nasal washings and  aspirates are unacceptable for Xpert Xpress SARS-CoV-2/FLU/RSV  testing.  Fact Sheet for Patients: PinkCheek.be  Fact Sheet for Healthcare Providers: GravelBags.it  This test is not yet approved or cleared by the [place] FDA and  has been authorized for detection and/or diagnosis of SARS-CoV-2 by  FDA under an Emergency Use Authorization (EUA). This EUA will remain  in effect (meaning this test can be used) for the duration of the  Covid-19 declaration under Section 564(b)(1) of the Act, 21  U.S.C. section 360bbb-3(b)(1), unless the authorization is  terminated or revoked. Performed at [facility], [address] [place], [place] [address]      Labs: BNP (last 3 results) Recent Labs    10/20/19 1217  BNP 315.3*    Basic Metabolic Panel: Recent Labs  Lab 10/20/19 1218 10/20/19 1218 10/21/19 0642 10/22/19 0543 10/24/19 0604 10/25/19 0408 10/26/19 0655  NA 140  --  139 141 137 138  --   K 3.5   < > 3.8 3.6 3.3* 4.0 3.2*  CL 102  --  103 107 107 107  --   CO2 28  --  27 23 22 24   --   GLUCOSE 109*  --  101* 80 114* 99  --   BUN 18  --  16 20 10 13   --   CREATININE 0.94  --  0.78 1.09* 0.71 0.74  --   CALCIUM 8.9  --  8.6* 8.2* 7.7* 8.6*  --    < > = values in this interval not displayed.   Liver Function Tests: Recent Labs  Lab 10/20/19 1218  AST 29  ALT 20  ALKPHOS 45  BILITOT 1.6*  PROT 6.8  ALBUMIN 4.1   No results for input(s): LIPASE, AMYLASE in the last 168 hours. No results for input(s): AMMONIA in the last 168 hours. CBC: Recent Labs  Lab 10/20/19 1218 10/20/19 1218 10/21/19 0642 10/22/19 0543 10/24/19 0604 10/25/19 0408 10/26/19 0655  WBC 7.7   < > 7.5 7.1 8.1 8.0 6.7  NEUTROABS 5.8  --   --   --   --   --   --   HGB 13.6   < > 13.2 12.4 11.4* 10.4* 9.6*  HCT 40.2   < > 38.2 37.5 34.1* 29.1* 27.1*  MCV 93.3   < > 92.0 95.2 95.5 90.9 90.3  PLT 148*   < > 141* 149* 146* 153 179   < > = values in this interval not displayed.   Cardiac Enzymes:  No results for input(s): CKTOTAL, CKMB, CKMBINDEX, TROPONINI in the last 168 hours. BNP: Invalid input(s): POCBNP CBG: No results for input(s): GLUCAP in the last 168 hours. D-Dimer No results for input(s): DDIMER in the last 72 hours. Hgb A1c No results for input(s): HGBA1C in the last 72 hours. Lipid Profile No results for input(s): CHOL, HDL, LDLCALC, TRIG, CHOLHDL, LDLDIRECT in the last 72 hours. Thyroid function studies No results for input(s): TSH, T4TOTAL, T3FREE, THYROIDAB in the last 72 hours.  Invalid input(s): FREET3 Anemia work up No results for input(s): VITAMINB12, FOLATE, FERRITIN, TIBC, IRON, RETICCTPCT in the last 72 hours. Urinalysis    Component Value Date/Time   COLORURINE STRAW (A)  06/06/2017 1015   APPEARANCEUR CLEAR (A) 06/06/2017 1015   LABSPEC 1.006 06/06/2017 1015   PHURINE 8.0 06/06/2017 1015   GLUCOSEU NEGATIVE 06/06/2017 1015   HGBUR NEGATIVE 06/06/2017 1015   [place] 06/06/2017 1015   [place] 06/06/2017 1015   PROTEINUR NEGATIVE 06/06/2017 1015   NITRITE NEGATIVE 06/06/2017 1015   LEUKOCYTESUR NEGATIVE 06/06/2017 1015   Sepsis Labs Invalid input(s): PROCALCITONIN,  WBC,  LACTICIDVEN Microbiology Recent Results (from the past 240 hour(s))  Respiratory Panel by RT PCR (Flu A&B, Covid) - Nasopharyngeal Swab     Status: None   Collection Time: 10/20/19 12:18 PM   Specimen: Nasopharyngeal Swab  Result Value Ref Range Status   SARS Coronavirus 2 by RT PCR NEGATIVE NEGATIVE Final    Comment: (NOTE) SARS-CoV-2 target nucleic acids are NOT DETECTED.  The SARS-CoV-2 RNA is generally detectable in upper respiratoy specimens during the acute phase of infection. The lowest concentration of SARS-CoV-2 viral copies this assay can detect is 131 copies/mL. A negative result does not preclude SARS-Cov-2 infection and should not be used as the sole basis for treatment or other patient management decisions. A negative result may occur with  improper specimen collection/handling, submission of specimen other than nasopharyngeal swab, presence of viral mutation(s) within the areas targeted by this assay, and inadequate number of viral copies (<131 copies/mL). A negative result must be combined with clinical observations, patient history, and epidemiological information. The expected result is Negative.  Fact Sheet for Patients:  PinkCheek.be  Fact Sheet for Healthcare Providers:  GravelBags.it  This test is no t yet approved or cleared by the [place] FDA and  has been authorized for detection and/or diagnosis of SARS-CoV-2 by FDA under an Emergency Use Authorization (EUA).  This EUA will remain  in effect (meaning this test can be used) for the duration of the COVID-19 declaration under Section 564(b)(1) of the Act, 21 U.S.C. section 360bbb-3(b)(1), unless the authorization is terminated or revoked sooner.     Influenza A by PCR NEGATIVE NEGATIVE Final   Influenza B by PCR NEGATIVE NEGATIVE Final    Comment: (NOTE) The Xpert Xpress SARS-CoV-2/FLU/RSV assay is intended as an aid in  the diagnosis of influenza from Nasopharyngeal swab specimens and  should not be used as a sole basis for treatment. Nasal washings and  aspirates are unacceptable for Xpert Xpress SARS-CoV-2/FLU/RSV  testing.  Fact Sheet for Patients: PinkCheek.be  Fact Sheet for Healthcare Providers: GravelBags.it  This test is not yet approved or cleared by the [place] FDA and  has been authorized for detection and/or diagnosis of SARS-CoV-2 by  FDA under an Emergency Use Authorization (EUA). This EUA will remain  in effect (meaning this test can be used) for the duration of the  Covid-19 declaration under Section 564(b)(1) of  the Act, 21  U.S.C. section 360bbb-3(b)(1), unless the authorization is  terminated or revoked. Performed at [facility], [address], [place], [place] [address]      Time coordinating discharge: Over 30 minutes  SIGNED:   Nolberto Hanlon, MD  [facility] 10/26/2019, 1:23 PM Pager   If 7PM-7AM, please contact night-coverage [email or web address] Password [email or web address]

## 2019-10-26 NOTE — Progress Notes (Signed)
EMS is here to transport patient home.

## 2019-10-26 NOTE — Plan of Care (Signed)
  Problem: Pain Management: Goal: Pain level will decrease Outcome: Adequate for Discharge

## 2019-10-26 NOTE — Progress Notes (Signed)
Patient is being discharged to home this afternoon via EMS.DC & Rx instructions given to JJ, patient's daughter, and this nurse answered her questions. Daughter says the hospital bed will be delivered around 1pm. Belongings packed, IV removed and NT prepared patient for transport. Awaiting EMS

## 2019-10-27 LAB — SURGICAL PATHOLOGY

## 2019-11-22 ENCOUNTER — Other Ambulatory Visit
Admission: RE | Admit: 2019-11-22 | Discharge: 2019-11-22 | Disposition: A | Discharge status: Home / Self Care | Payer: Medicare Other | Source: Ambulatory Visit | Attending: Internal Medicine | Admitting: Internal Medicine

## 2019-11-22 DIAGNOSIS — N390 Urinary tract infection, site not specified: Secondary | ICD-10-CM | POA: Insufficient documentation

## 2019-11-22 LAB — URINALYSIS, COMPLETE (UACMP) WITH MICROSCOPIC
Bilirubin Urine: NEGATIVE
Glucose, UA: NEGATIVE mg/dL
Hgb urine dipstick: NEGATIVE
Ketones, ur: NEGATIVE mg/dL
Leukocytes,Ua: NEGATIVE
Nitrite: NEGATIVE
Protein, ur: NEGATIVE mg/dL
Specific Gravity, Urine: 1.01 (ref 1.005–1.030)
pH: 7 (ref 5.0–8.0)

## 2019-11-24 LAB — URINE CULTURE: Culture: NO GROWTH

## 2019-12-09 ENCOUNTER — Telehealth: Payer: Self-pay | Admitting: Nurse Practitioner

## 2019-12-09 NOTE — Telephone Encounter (Signed)
Called daughter, Heinz Knuckles, to offer to schedule the Palliative Consult, no answer - left message with reason for call along with my name and contact number.

## 2019-12-10 ENCOUNTER — Telehealth: Payer: Self-pay | Admitting: Nurse Practitioner

## 2019-12-10 NOTE — Telephone Encounter (Signed)
Spoke with daughter, Heinz Knuckles, regarding Palliative referral/services and she was in agreement with scheduling visit.  I have scheduled an In-person Consult for 12/25/19 @ 2:15 PM

## 2019-12-25 ENCOUNTER — Other Ambulatory Visit: Payer: Medicare Other | Admitting: Nurse Practitioner

## 2019-12-25 ENCOUNTER — Other Ambulatory Visit: Payer: Self-pay

## 2019-12-25 ENCOUNTER — Encounter: Payer: Self-pay | Admitting: Nurse Practitioner

## 2019-12-25 DIAGNOSIS — G3183 Dementia with Lewy bodies: Secondary | ICD-10-CM

## 2019-12-25 DIAGNOSIS — Z515 Encounter for palliative care: Secondary | ICD-10-CM

## 2019-12-25 DIAGNOSIS — F0281 Dementia in other diseases classified elsewhere with behavioral disturbance: Secondary | ICD-10-CM

## 2019-12-25 NOTE — Progress Notes (Signed)
[facility] Consult Note Telephone: [phone]  Fax: [phone]  PATIENT NAME: Amy Hess DOB: [birth year] MRN: [id number]  PRIMARY CARE PROVIDER:   Kirk Ruths, MD  REFERRING PROVIDER:  Kirk Ruths, MD [facility] [place],   [id number]  RESPONSIBLE PARTY:   Daughter Heinz Knuckles [phone]  I was asked by Dr Ouida Sills to see Amy Hess for Palliative care consult for complex medical decision making.  1. Advance Care Planning; DNR per daughter, wishes not to place in vynca at this time. Blank MOST form reviewed and given to [name] May daughter to further review with Dr Ouida Sills  2. Goals of Care: Goals include to maximize quality of life and symptom management. Our advance care planning conversation included a discussion about:     The value and importance of advance care planning   Exploration of personal, cultural or spiritual beliefs that might influence medical decisions   Exploration of goals of care in the event of a sudden injury or illness   Identification and preparation of a healthcare agent   Review and updating or creation of an  advance directive document.  3. Palliative care encounter; Palliative care encounter; Palliative medicine team will continue to support patient, patient's family, and medical team. Visit consisted of counseling and education dealing with the complex and emotionally intense issues of symptom management and palliative care in the setting of serious and potentially life-threatening illness  4. F/u 4 weeks for ongoing monitoring dementia, appetite, disease progression, ongoing discussion of   I spent 90 minutes providing this consultation,  from 3:00pm to 4:30pm. More than 50% of the time in this consultation was spent coordinating communication.   HISTORY OF PRESENT ILLNESS:  Amy Hess is a 86 y.o. year old female  with multiple medical problems including Lewy bodies dementia, trigeminal neuralgia, HTN, RA, hypothyroidism, anemia, h/o tongue cancer, OA, Afib. Initial in person visit palliative care visit for Amy Hess and daughter Ms May. I meant initially with daughter, Ms May. We talked about purpose of Palliative care. We talked about past medical history, chronic disease progression and diagnosis of dementia. We talked about Lewy bodies diagnosis with progression. We talked about functional and cognitive changes. At present Amy Hess continues to be able to be ambulatory no recent falls. Amy Hess does have a walker she uses. Amy Hess does require 24-hour caregivers which are paid in the home. Amy Hess does required to be bathed, dressed. Amy Hess is able to feed herself. Some meals Amy Hess does eat a hundred percent other times Amy Hess eat very small amount. No recent infections, hospitalizations, wounds. Amy Hess does have 24-hour caregivers round the clock. Ms. May endorses [name] is more clear cognitively than others days. We talked about hallucinations with delusions at length for what [name] does take Seroquel. We talked about medical goals of care, review MOST form. Ms May endorses she will bring to further discuss with Dr Ouida Sills. We talked about code status as she is a DNR. Ms. May endorses she does have the [place] form but declines at this time to allow it to be placed in vynca. Ms. May endorses that can be revisited. We talked about different resources that are available. We talked about the CAPS program with a pending application. At present Amy Hess currently does not have Medicaid. We talked about Medicaid process. We talked about Elder attorney. We talked about  Medicaid caseworker would be helpful to answer questions and concerns. We talked about processes including other Medicaid programs including PCS  Services. We talked about Hospice benefit under Medicare. We talked about what services will be provided. We talked about role of Palliative care in plan of care.  Amy Hess was lying in her bed asleep. Amy Hess did awake to verbal cues saying "hallelujah hallelujah hallelujah". Holding my hand. Explained assessment, Amy Hess was cooperative. Amy Hess returned to sleep and did not open her eyes as she waved bye. We talked about follow up Palliative care visit in 1 month if needed or sooner should she declined. Ms. May in agreement. Therapeutic listening and emotional support provided. Questions answered to satisfaction. Contact information provided.  Palliative Care was asked to help to continue to address goals of care.   CODE STATUS: DNR  PPS: 50% HOSPICE ELIGIBILITY/DIAGNOSIS: TBD  PAST MEDICAL HISTORY:  Past Medical History:  Diagnosis Date  . Anemia   . Arthritis   . [name] (coronary artery disease)   . Cancer ([place])    thyroid and mouth cancer 2013, squamous cell of tongue  . Dementia ([place])   . Diverticulosis   . Eczema   . GERD (gastroesophageal reflux disease)   . History of kidney stones   . Hypertension   . Osteoarthritis   . Rheumatoid arthritis ([place])     SOCIAL HX:  Social History   Tobacco Use  . Smoking status: Never Smoker  . Smokeless tobacco: Never Used  Substance Use Topics  . Alcohol use: No    ALLERGIES:  Allergies  Allergen Reactions  . Dyazide [Hydrochlorothiazide W-Triamterene] Other (See Comments)    On Chart ot MD office  . Plaquenil [Hydroxychloroquine Sulfate] Other (See Comments)    Not sure per MD office on chart   . Sulfate Other (See Comments)    Pt not sure  . Codeine Rash     PERTINENT MEDICATIONS:  Outpatient Encounter Medications as of 12/25/2019  Medication Sig  . acetaminophen (TYLENOL) 500 MG tablet Take 1,000 mg by mouth every 6 (six) hours as needed.  Marland Kitchen alendronate (FOSAMAX) 70 MG tablet Take 70 mg  by mouth once a week. ** Tuesdays Take with a full glass of water on an empty stomach.  . Biotin 1000 MCG tablet Take 1,000 mcg by mouth daily.  . Calcium Carb-Cholecalciferol (CALCIUM 600 + D PO) Take 1 tablet by mouth daily.  . carvedilol (COREG) 3.125 MG tablet Take 1 tablet by mouth 2 (two) times daily.  . cetirizine (ZYRTEC) 10 MG tablet Take 10 mg by mouth at bedtime.  . diclofenac Sodium (VOLTAREN) 1 % GEL Apply 2 g topically 3 (three) times daily as needed (to l hip as needed for pain).  Marland Kitchen docusate sodium (COLACE) 100 MG capsule Take 1 capsule (100 mg total) by mouth 2 (two) times daily.  . famotidine (PEPCID) 20 MG tablet Take 1 tablet (20 mg total) by mouth daily.  . Flaxseed, Linseed, (FLAXSEED OIL) 1000 MG CAPS Take 1 capsule by mouth daily.   . folic acid (FOLVITE) 1 MG tablet Take 1 mg by mouth daily.   Marland Kitchen KLOR-CON M10 10 MEQ tablet Take 10 mEq by mouth daily.  Marland Kitchen levothyroxine (SYNTHROID, LEVOTHROID) 75 MCG tablet Take 75 mcg by mouth daily before breakfast.   . melatonin 3 MG TABS tablet Take 3 mg by mouth at bedtime.  . memantine (NAMENDA) 10 MG tablet Take 10 mg by mouth 2 (two) times daily.  Marland Kitchen  methocarbamol (ROBAXIN) 500 MG tablet Take 1 tablet (500 mg total) by mouth every 8 (eight) hours as needed for muscle spasms.  . Multiple Vitamin (MULTIVITAMIN) capsule Take 1 capsule by mouth daily.   . polyethylene glycol (MIRALAX / GLYCOLAX) 17 g packet Take 17 g by mouth daily.  . predniSONE (DELTASONE) 5 MG tablet Take 5 mg by mouth daily with breakfast.   . QUEtiapine (SEROQUEL) 25 MG tablet Take 25 mg by mouth 2 (two) times daily.  . Rivaroxaban (XARELTO) 15 MG TABS tablet Take 1 tablet (15 mg total) by mouth daily with supper.  . torsemide (DEMADEX) 5 MG tablet Take 5 mg by mouth at bedtime.   . traMADol (ULTRAM) 50 MG tablet Take 1 tablet (50 mg total) by mouth every 12 (twelve) hours as needed for up to 10 doses for severe pain.   No facility-administered encounter medications  on file as of 12/25/2019.    PHYSICAL EXAM:   General: NAD, frail appearing, thin, confused female Cardiovascular: regular rate and rhythm Pulmonary: clear ant fields Neurological: generalized weakness  Tasfia Vasseur Ihor Gully, NP

## 2020-02-16 ENCOUNTER — Other Ambulatory Visit: Payer: Medicare Other | Admitting: Nurse Practitioner

## 2020-03-09 ENCOUNTER — Other Ambulatory Visit: Payer: Medicare Other | Admitting: Nurse Practitioner

## 2020-04-12 ENCOUNTER — Telehealth: Payer: Self-pay | Admitting: Nurse Practitioner

## 2020-04-12 NOTE — Telephone Encounter (Signed)
Rec'd message that daughter had called and needed to reschedule tomorrow appointment @ 3 due to another appointment.  Spoke with daughter and she said that she could do a telehealth Palliative f/u visit with NP tomorrow evening around 4:30.  Contacted NP and she was in agreement with a telehealth Palliative f/u visit at 4:30 PM on 04/13/20.  Called daughter back to let her know and she was in agreement with this.

## 2020-04-13 ENCOUNTER — Other Ambulatory Visit: Payer: Self-pay

## 2020-04-13 ENCOUNTER — Encounter: Payer: Self-pay | Admitting: Nurse Practitioner

## 2020-04-13 ENCOUNTER — Other Ambulatory Visit: Payer: Medicare Other | Admitting: Nurse Practitioner

## 2020-04-13 DIAGNOSIS — G3183 Dementia with Lewy bodies: Secondary | ICD-10-CM

## 2020-04-13 DIAGNOSIS — Z515 Encounter for palliative care: Secondary | ICD-10-CM

## 2020-04-13 DIAGNOSIS — F0281 Dementia in other diseases classified elsewhere with behavioral disturbance: Secondary | ICD-10-CM

## 2020-04-13 NOTE — Progress Notes (Signed)
[facility] Consult Note Telephone: [phone]  Fax: [phone]  PATIENT NAME: Amy Hess DOB: February 23, 1928 MRN: [id number]  PRIMARY CARE PROVIDER:   Kirk Ruths, MD  REFERRING PROVIDER:  Kirk Ruths, MD [facility] [place],  [place] [address]  RESPONSIBLE PARTY:   Daughter Heinz Knuckles [phone]  Due to the COVID-19 crisis, this visit was done via telemedicine from my office and it was initiated and consent by this patient and or family  1.Advance Care Planning; DNR   2. Goals of Care: Goals include to maximize quality of life and symptom management. Our advance care planning conversation included a discussion about:   The value and importance of advance care planning  Exploration of personal, cultural or spiritual beliefs that might influence medical decisions  Exploration of goals of care in the event of a sudden injury or illness  Identification and preparation of a healthcare agent  Review and updating or creation of anadvance directive document.  3.Palliative care encounter; Palliative care encounter; Palliative medicine team will continue to support patient, patient's family, and medical team. Visit consisted of counseling and education dealing with the complex and emotionally intense issues of symptom management and palliative care in the setting of serious and potentially life-threatening illness  4. F/u 8 weeks for ongoing monitoring dementia, appetite, disease progression, ongoing discussion of   I spent 35 minutes providing this consultation,  from 4:30pm to 5:05pm. More than 50% of the time in this consultation was spent coordinating communication.   HISTORY OF PRESENT ILLNESS:  Amy Hess is a 87 y.o. year old female with multiple medical problems including Lewy bodies dementia, trigeminal neuralgia, HTN, RA, hypothyroidism, anemia, h/o  tongue cancer, OA, Afib. I called Amy Hess, Amy Hess's daughter. For telemedicine, telephonic as video not available. We talked about PC visit. We talked about how Amy Hess has been doing. Amy Hess endorses recent UTI, treated and improved. Added Trazodone for mood per Neurology. We talked about chronic disease progression of Lewy body dementia, expectations. Medical goals reviewed. We talked about appointment with Dr Ouida Sills with elevated TSH level. We talked about medications, consistency and consider levothyroxine at bedtime if unable to get Amy Hess to take in am. We talked about Ms. Grine functionally unchanged, ambulatory. Amy Hess endorses she is taking Amy Hess to get her nails done this week. We talked about Amy Hess sleep schedule, late in am into early afternoon, typically goes to bed around 8pm. We talked about Medicaid application with CAPS program. We talked about caregiver stress, fatigue. We talked about in-home caregivers. We talked about role PC in poc. Discussed will f/u in 8 weeks if needed or sooner if declines. Amy Hess in agreement, appointment scheduled. Therapeutic listening, emotional support provided. Questions answered to satisfaction  Palliative Care was asked to help to continue to address goals of care.   CODE STATUS: DNR  PPS: 40% HOSPICE ELIGIBILITY/DIAGNOSIS: TBD  PAST MEDICAL HISTORY:  Past Medical History:  Diagnosis Date  . Anemia   . Arthritis   . [name] (coronary artery disease)   . Cancer ([place])    thyroid and mouth cancer 2013, squamous cell of tongue  . Dementia ([place])   . Diverticulosis   . Eczema   . GERD (gastroesophageal reflux disease)   . History of kidney stones   . Hypertension   . Osteoarthritis   . Rheumatoid arthritis ([place])  SOCIAL HX:  Social History   Tobacco Use  . Smoking status: Never Smoker  . Smokeless tobacco: Never Used  Substance Use Topics  . Alcohol use: No    ALLERGIES:   Allergies  Allergen Reactions  . Dyazide [Hydrochlorothiazide W-Triamterene] Other (See Comments)    On Chart ot MD office  . Plaquenil [Hydroxychloroquine Sulfate] Other (See Comments)    Not sure per MD office on chart   . Sulfate Other (See Comments)    Pt not sure  . Codeine Rash     PERTINENT MEDICATIONS:  Outpatient Encounter Medications as of 04/13/2020  Medication Sig  . acetaminophen (TYLENOL) 500 MG tablet Take 1,000 mg by mouth every 6 (six) hours as needed.  Marland Kitchen alendronate (FOSAMAX) 70 MG tablet Take 70 mg by mouth once a week. ** Tuesdays Take with a full glass of water on an empty stomach.  . Biotin 1000 MCG tablet Take 1,000 mcg by mouth daily.  . Calcium Carb-Cholecalciferol (CALCIUM 600 + D PO) Take 1 tablet by mouth daily.  . carvedilol (COREG) 3.125 MG tablet Take 1 tablet by mouth 2 (two) times daily.  . cetirizine (ZYRTEC) 10 MG tablet Take 10 mg by mouth at bedtime.  . diclofenac Sodium (VOLTAREN) 1 % GEL Apply 2 g topically 3 (three) times daily as needed (to l hip as needed for pain).  Marland Kitchen docusate sodium (COLACE) 100 MG capsule Take 1 capsule (100 mg total) by mouth 2 (two) times daily.  . famotidine (PEPCID) 20 MG tablet Take 1 tablet (20 mg total) by mouth daily.  . Flaxseed, Linseed, (FLAXSEED OIL) 1000 MG CAPS Take 1 capsule by mouth daily.   . folic acid (FOLVITE) 1 MG tablet Take 1 mg by mouth daily.   Marland Kitchen KLOR-CON M10 10 MEQ tablet Take 10 mEq by mouth daily.  Marland Kitchen levothyroxine (SYNTHROID, LEVOTHROID) 75 MCG tablet Take 75 mcg by mouth daily before breakfast.   . melatonin 3 MG TABS tablet Take 3 mg by mouth at bedtime.  . memantine (NAMENDA) 10 MG tablet Take 10 mg by mouth 2 (two) times daily.  . methocarbamol (ROBAXIN) 500 MG tablet Take 1 tablet (500 mg total) by mouth every 8 (eight) hours as needed for muscle spasms.  . Multiple Vitamin (MULTIVITAMIN) capsule Take 1 capsule by mouth daily.   . polyethylene glycol (MIRALAX / GLYCOLAX) 17 g packet Take 17  g by mouth daily.  . predniSONE (DELTASONE) 5 MG tablet Take 5 mg by mouth daily with breakfast.   . QUEtiapine (SEROQUEL) 25 MG tablet Take 25 mg by mouth 2 (two) times daily.  . Rivaroxaban (XARELTO) 15 MG TABS tablet Take 1 tablet (15 mg total) by mouth daily with supper.  . torsemide (DEMADEX) 5 MG tablet Take 5 mg by mouth at bedtime.   . traMADol (ULTRAM) 50 MG tablet Take 1 tablet (50 mg total) by mouth every 12 (twelve) hours as needed for up to 10 doses for severe pain.   No facility-administered encounter medications on file as of 04/13/2020.    PHYSICAL EXAM:  Deferred  Christin Z Gusler, NP

## 2020-06-15 ENCOUNTER — Other Ambulatory Visit: Payer: Self-pay

## 2020-06-15 ENCOUNTER — Other Ambulatory Visit: Payer: Medicare Other | Admitting: Nurse Practitioner

## 2020-06-15 ENCOUNTER — Encounter: Payer: Self-pay | Admitting: Nurse Practitioner

## 2020-06-15 DIAGNOSIS — R5381 Other malaise: Secondary | ICD-10-CM

## 2020-06-15 DIAGNOSIS — Z515 Encounter for palliative care: Secondary | ICD-10-CM

## 2020-06-15 NOTE — Progress Notes (Signed)
[facility] Consult Note Telephone: [phone]  Fax: [phone]    Date of encounter: 06/15/20 PATIENT NAME: Amy Hess [id number]   [id number] (home)  DOB: 1928/09/14 MRN: [id number] PRIMARY CARE PROVIDER:    Kirk Ruths, MD,  [place] [address] [phone]   RESPONSIBLE PARTY:    Contact Information    Name Relation Home Work Mobile   May,[name] Daughter [phone]  [phone]   Hicks,Vickie Daughter [phone]       Due to the COVID-19 crisis, this visit was done via telemedicine from my office and it was initiated and consent by this patient and or family.  I connected with  Ambreen Tufte Snook on 06/15/20 by a video enabled telemedicine application and verified that I am speaking with the correct person.   I discussed the limitations of evaluation and management by telemedicine. The patient expressed understanding and agreed to proceed.  Palliative Care was asked to follow this patient by consultation request of  Kirk Ruths, MD to address advance care planning and complex medical decision making. This is a follow up [place] / RECOMMENDATIONS:   Symptom Management/Plan:  1.Advance Care Planning;DNR   2. Goals of Care: Goals include to maximize quality of life and symptom management. Our advance care planning conversation included a discussion about:   The value and importance of advance care planning  Exploration of personal, cultural or spiritual beliefs that might influence medical decisions  Exploration of goals of care in the event of a sudden injury or illness  Identification and preparation of a healthcare agent  Review and updating or creation of anadvance directive document.  3.Palliative care encounter; Palliative care encounter; Palliative medicine team will continue to  support patient, patient's family, and medical team. Visit consisted of counseling and education dealing with the complex and emotionally intense issues of symptom management and palliative care in the setting of serious and potentially life-threatening illness  4. Debility secondary to dementia. Continue restorative exercises, mobility oob, nutrition, monitoring including fall risk  Follow up Palliative Care Visit: Palliative care will continue to follow for complex medical decision making, advance care planning, and clarification of goals. Return 8 weeks or prn.  I spent 40 minutes providing this consultation. More than 50% of the time in this consultation was spent in counseling and care coordination.  HOSPICE ELIGIBILITY/DIAGNOSIS: TBD  Chief Complaint: Follow up palliative consult for complex medical decision making  HISTORY OF PRESENT ILLNESS:  Amy Hess is a 87 y.o. year old female  with Lewy bodies dementia, trigeminal neuralgia, HTN, RA, hypothyroidism, anemia, h/o tongue cancer.   I called JJ, Amy Hess daughter for scheduled telemedicine telephonic is video not available palliative care follow up visit. JJ in agreement. We talked about how Ms. Qin has been doing. JJ endorses Amy Hess has been doing OK. Ms. Towers has been walking with assistance, gait belt. At times Amy Hess uses a Environmental consultant or without. We talked about recent injection in her knee which may have helped. We talked about functional ability as Amy Hess does require assistance with bathing, dressing, toileting as she does wear adult diapers. We talked about appetite which has been good. JJ endorses no noted weight loss. JJ endorses no further cognitive decline. We talked about Amy Hess language and words that Amy Hess uses. We talked about chronic disease progression of lewy bodies dementia.  We we talked about fast score which JJ has been reading about. We  talked about fast score related to Amy Hess. We talked about PPS scoring as that is one measure that we use frequently in palliative care. We talked about follow up with caps program and Medicaid application. JJ endorses that it is finally complete. We talked about expectations with progression. Medical goals reviewed. We talked about caregiver stress and fatigue importance of self care. We talked about caregivers that stay with Amy Hess well as JJ. We talked about follow up visit in three months if needed or sooner should she decline, DJ in agreement and appointment scheduled. Therapeutic listening and emotional support provided. Questions answered.     History obtained from review of EMR, discussion with interview with family, daughter Amy Hess I reviewed available labs, medications, imaging, studies and related documents from the EMR.  Records reviewed and summarized above.   ROS  Full 14 system review of systems performed and negative with exception of: as per HPI.  Physical Exam: deferred  Questions and concerns were addressed. The family was encouraged to call with questions and/or concerns. Provided general support and encouragement, no other unmet needs identified  Thank you for the opportunity to participate in the care of Amy Hess.  The palliative care team will continue to follow. Please call our office at [phone] if we can be of additional assistance.   This chart was dictated using voice recognition software. Despite best efforts to proofread, errors can occur which can change the documentation meaning.  Forney Kleinpeter Ihor Gully, NP , MSN , [facility]

## 2020-08-24 ENCOUNTER — Other Ambulatory Visit: Payer: Medicare Other | Admitting: Nurse Practitioner

## 2020-08-24 ENCOUNTER — Encounter: Payer: Self-pay | Admitting: Nurse Practitioner

## 2020-08-24 ENCOUNTER — Other Ambulatory Visit: Payer: Self-pay

## 2020-08-24 DIAGNOSIS — F0281 Dementia in other diseases classified elsewhere with behavioral disturbance: Secondary | ICD-10-CM

## 2020-08-24 DIAGNOSIS — G3183 Dementia with Lewy bodies: Secondary | ICD-10-CM

## 2020-08-24 DIAGNOSIS — Z515 Encounter for palliative care: Secondary | ICD-10-CM

## 2020-08-24 DIAGNOSIS — R5381 Other malaise: Secondary | ICD-10-CM

## 2020-08-24 DIAGNOSIS — R634 Abnormal weight loss: Secondary | ICD-10-CM

## 2020-08-24 NOTE — Progress Notes (Signed)
[facility] Consult Note Telephone: [phone]  Fax: [phone]    Date of encounter: 08/24/20 PATIENT NAME: Amy Hess [id number]   [phone] (home)  DOB: [birth year] MRN: [id number] PRIMARY CARE PROVIDER:    Kirk Ruths, MD,  [place] [address] [phone]  RESPONSIBLE PARTY:    Contact Information     Name Relation Home Work Mobile   May,[name] Daughter [phone]  [phone]   Hicks,Vickie Daughter [phone]        I met face to face with patient and family in home. Palliative Care was asked to follow this patient by consultation request of  Kirk Ruths, MD to address advance care planning and complex medical decision making. This is a follow up [place] / RECOMMENDATIONS:   Symptom Management/Plan: 1. Advance Care Planning; DNR   2. Weight loss secondary to dementia progression slow, continue to monitor weights, measurements, will follow close. Did introduce concept of Hospice services through Medicare benefit. Continue encourage meals, supplements.  20 cm left arm 27 cm left leg  Weight 101 lbs 1 year ago Today 96 lbs   3. Palliative care encounter; Palliative care encounter; Palliative medicine team will continue to support patient, patient's family, and medical team. Visit consisted of counseling and education dealing with the complex and emotionally intense issues of symptom management and palliative care in the setting of serious and potentially life-threatening illness   4. Debility secondary to dementia. Continue restorative exercises, mobility oob, nutrition, monitoring including fall risk  5. Constipation; discussed bowel regimen, we talked about using mirlax, senna plus. Increase fiber, fluids.   Follow up Palliative Care Visit: Palliative care will continue to follow for complex medical  decision making, advance care planning, and clarification of goals. Return 8 weeks or prn.  I spent 95 minutes providing this consultation. More than 50% of the time in this consultation was spent in counseling and care coordination.  PPS: 40%  Chief Complaint: Follow up Palliative consult for complex medical decision making  HISTORY OF PRESENT ILLNESS:  Amy Hess is a 87 y.o. year old female  with multiple medical problems including  Lewy bodies dementia, trigeminal neuralgia, HTN, RA, hypothyroidism, anemia, h/o tongue cancer. Follow up palliative care visit with Amy Hess, daughter Amy Hess and caregiver Amy Hess and Amy Hess's home. Amy Waterbury was sitting in the chair in her living room period Amy Hellstrom appears comfortable. We talked about the purpose of palliative care visit. We talked about how much Amy Hess has been doing. JJ endorses she has been doing better period recent treatment for UTI. Appetite has been good but they continued to add supplements. Measured Amy Hess's left arm and left leg. Reviewed previous weights and one year ago she weighed 101 pounds. Currently today use home scale and weighed 96 pounds. We talked about nutrition. We talked about constipation intermittently and bowel patterns with bowel regimen. We talked about using miralax in the morning in Senna plus one tablet in the evening. Discussed should she continue to [facility] more issues with constipation can add senna plus one tablet twice a day. We talked about Amy Hess daily routine. We talked about her 89th birthday party. We talked about Amy Hess current functional abilities as she is able to ambulate once she's lifted with a gait belt out of the chair with a walker. She does take small shuffling steps. Amy  Hess was cooperative with assessment. Amy Hess does require to be bathed and dressed. Amy Hess has 24 hour caregivers at home. JJ endorses  Medicaid was approved in [place] program in place as well as food stamps. JJ endorses this has been a tremendous help. We talked about chronic disease progression of lewy bodies dementia. We talked about exercise that Amy Kafer does do. Video of Amy Basden doing exercises following prompting and lead by caregiver. Praise my Lutes for doing the exercises. Amy Lutterman was smiling, interactive throughout palliative care visit. Amy Mallen was able to answer simple questions.  Medical goals reviewed. We reviewed labs that was done through my chart Dr Ouida Hess about six months ago with stable albumin and total protein. We talked about nutrition. Amy Hess does have a place on her lower anterior like but that appears to be healing a non raised erythematous area the caregiver has been putting tegraderm on. Also Amy Hess has what looks like a blister left anterior hip though it does not appear to be pressure it looks more like a blister resolving. We discussed with JJ to continue to monitor areas. We talked about sleep, sleep patterns. We talked at length about behaviors, seroquel and using melatonin. We talked about hallucinations with delusions. Discuss with JJ that the seroquel will benefit with the delusions and hallucinations which have seemed to improve in [place] has decreased the seroquel to once a day. Discuss that can use the melatonin 21m during the day should she have more agitation or combative times which it's been quite some time since those behaviors have been exhibited. We talked about Hospice benefit under Medicare program, eligibility and what services are provided. Discuss with JJ that slight weight loss will continue to monitor and follow with palliative care that if she does continue to lose weight and muscle mass may be eligible in the next several months. We talked about [facility]. We talked about follow up palliative care visit in two months if needed or  sooner should she decline. JJ in agreement, appointment scheduled. Therapeutic listening, emotional support provided. Questions answered.  History obtained from review of EMR, discussion with Daughter, JDewitt Hess and Amy. Esguerra.  I reviewed available labs, medications, imaging, studies and related documents from the EMR.  Records reviewed and summarized above.   ROS Full 14 system review of systems performed and negative with exception of: as per HPI.   Physical Exam: Constitutional: NAD General: frail appearing, thin, pleasant female EYES: lids intact ENMT: oral mucous membranes moist CV: S1S2, RRR, no LE edema Pulmonary: LCTA, no increased work of breathing, no cough, room air Abdomen: soft and non tender MSK: ambulatory with walker moderate assistance' muscle wasting Skin: warm and dry Neuro:  + generalized weakness,  + cognitive impairment Psych: non-anxious affect, A and Oriented to self, place  Questions and concerns were addressed. The patient/daughter was encouraged to call with questions and/or concerns. Provided general support and encouragement, no other unmet needs identified   Thank you for the opportunity to participate in the care of Amy. [place].  The palliative care team will continue to follow. Please call our office at [phone] we can be of additional assistance.   This chart was dictated using voice recognition software.  Despite best efforts to proofread,  errors can occur which can change the documentation meaning.   Michayla Mcneil Z Sanai Frick, NP   COVID-19 PATIENT SCREENING TOOL Asked and negative response unless otherwise noted:   Have you had symptoms of  covid, tested positive or been in contact with someone with symptoms/positive test in the past 5-10 days? NO

## 2020-08-25 ENCOUNTER — Other Ambulatory Visit: Payer: Medicare Other | Admitting: Nurse Practitioner

## 2020-08-25 ENCOUNTER — Other Ambulatory Visit: Payer: Self-pay

## 2020-11-11 ENCOUNTER — Other Ambulatory Visit: Payer: Self-pay

## 2020-11-11 ENCOUNTER — Encounter: Payer: Self-pay | Admitting: Nurse Practitioner

## 2020-11-11 ENCOUNTER — Other Ambulatory Visit: Payer: Medicare Other | Admitting: Nurse Practitioner

## 2020-11-11 DIAGNOSIS — Z515 Encounter for palliative care: Secondary | ICD-10-CM

## 2020-11-11 DIAGNOSIS — G3183 Dementia with Lewy bodies: Secondary | ICD-10-CM

## 2020-11-11 DIAGNOSIS — R5381 Other malaise: Secondary | ICD-10-CM

## 2020-11-11 DIAGNOSIS — R634 Abnormal weight loss: Secondary | ICD-10-CM

## 2020-11-11 NOTE — Progress Notes (Signed)
[facility] Consult Note Telephone: [phone]  Fax: [phone]    Date of encounter: 11/11/20 10:42 PM PATIENT NAME: Amy Hess [id number]   [phone] (home)  DOB: 26-Jul-1928 MRN: [id number] PRIMARY CARE PROVIDER:    Kirk Ruths, MD,  [place] [id number] [phone] RESPONSIBLE PARTY:    Contact Information     Name Relation Home Work Mobile   Amy Hess Daughter [phone]  [phone]   Amy Hess Daughter [phone]        I met face to face with patient and family in home. Palliative Care was asked to follow this patient by consultation request of  Amy Ruths, MD to address advance care planning and complex medical decision making. This is a follow up visit.                                  ASSESSMENT AND PLAN / RECOMMENDATIONS:  Symptom Management/Plan: 1. Advance Care Planning; DNR   2. Weight loss secondary to dementia progression improving; continue to monitor weights, measurements, will follow close. Continue encourage meals, supplements.   Weight 101 lbs 1 year ago 08/24/2020 weight 96 lbs 11/11/2020 weight 104.7 lbs  Measurement from 08/24/2020  20 cm left arm 27 cm left leg  3. Palliative care encounter; Palliative care encounter; Palliative medicine team will continue to support patient, patient's family, and medical team. Visit consisted of counseling and education dealing with the complex and emotionally intense issues of symptom management and palliative care in the setting of serious and potentially life-threatening illness   4. Debility secondary to dementia. Continue restorative exercises, mobility oob, nutrition, monitoring including fall risk   5. Constipation; improved; discussed bowel regimen, we talked about using senna plus then ducolax when needed every other day; Increase fiber, fluids.    Follow up Palliative Care Visit: Palliative care will continue to follow for complex medical decision making, advance care planning, and clarification of goals. Return 8 weeks or prn.  I spent 67 minutes providing this consultation. More than 50% of the time in this consultation was spent in counseling and care coordination. PPS: 40%  Chief Complaint: Follow up palliative consult for complex medical problems   HISTORY OF PRESENT ILLNESS:  Amy Hess is a 87 y.o. year old female  with multiple medical problems including  Lewy bodies dementia, trigeminal neuralgia, HTN, RA, hypothyroidism, anemia, h/o tongue cancer. Follow up palliative care visit with Amy Hess, daughter Amy Hess and caregiver Amy Hess and Amy Hess's home. Amy Hess was walking with walker, weighed. 104.7 lbs. We talked about how Amy. Hess has been doing. Amy Hess endorses she has been doing better. We talked about recent telemedicine visit with Amy Hess, Orthopedic for injection which she tolerated well. We talked about Amy Hess ambulating with walker, shuffling with gait belt with moderate assistance. We talked about no recent falls. We talked about no recent hospitalizations, wounds, infections. We talked about appetite, weights, nutrition. We talked about bowel regimen which has improved. We talked about sleep pattern, sleep hygiene. We talked about daily routine, schedule. We talked about disease progression. Amy. Hess was cooperative with assessment. Amy. Hess does make eye contact, talks with words, difficulty following. Amy. Hess was very interactive, engaging. We talked about role pc in poc, scheduled for 8 weeks, will have palliative RN make a visit  4 weeks. Amy Hess in agreement. Therapeutic listening, emotional support provided. Questions answered.   History obtained from review of EMR, discussion with Amy Hess, daughter, caregivers and Amy Hess.  I reviewed available labs,  medications, imaging, studies and related documents from the EMR.  Records reviewed and summarized above.   ROS Full 10 system review of systems performed and negative with exception of: as per HPI.   Physical Exam: Constitutional: NAD General: frail appearing, thin, chronically ill female EYES:  lids intact ENMT: oral mucous membranes moist CV: S1S2, RRR, no LE edema Pulmonary: LCTA, no increased work of breathing, no cough, room air Abdomen: normo-active BS + 4 quadrants, soft and non tender MSK: ambulatory with gait belt/walker, shuffling with muscle wasting; Skin: warm and dry Neuro:  no generalized weakness,  + cognitive impairment Psych: non-anxious affect, A and Oriented to self  Questions and concerns were addressed. The patient/family was encouraged to call with questions and/or concerns. My contact information was provided. Provided general support and encouragement, no other unmet needs identified   Thank you for the opportunity to participate in the care of Amy Hess.  The palliative care team will continue to follow. Please call our office at [phone] if we can be of additional assistance.   This chart was dictated using voice recognition software.  Despite best efforts to proofread,  errors can occur which can change the documentation meaning.   Amy Desta Z Byrdie Miyazaki, NP   COVID-19 PATIENT SCREENING TOOL Asked and negative response unless otherwise noted:   Have you had symptoms of covid, tested positive or been in contact with someone with symptoms/positive test in the past 5-10 days?  NO

## 2020-12-08 ENCOUNTER — Other Ambulatory Visit: Payer: Medicare Other

## 2020-12-08 ENCOUNTER — Other Ambulatory Visit: Payer: Self-pay

## 2020-12-08 VITALS — BP 162/80 | Temp 97.1°F

## 2020-12-08 DIAGNOSIS — Z515 Encounter for palliative care: Secondary | ICD-10-CM

## 2020-12-08 NOTE — Progress Notes (Signed)
PATIENT NAME: Amy Hess DOB: 05-Apr-1928 MRN: [id number]  PRIMARY CARE PROVIDER: Kirk Ruths, MD  RESPONSIBLE PARTY:  Acct ID - Guarantor Home Phone Work Phone Relationship Acct Type  [id number] - STUBBLEFIEL* [phone]  Self P/F     [address], Amy Hess, [place] [address]    PLAN OF CARE and INTERVENTIONS:               1.  GOALS OF CARE/ ADVANCE CARE PLANNING:  Daughter's goal to keep patient home with 24/7 caregivers.               2.  PATIENT/CAREGIVER EDUCATION:  Safety               4. PERSONAL EMERGENCY PLAN:  Activate 911 for emergencies.                5.  DISEASE STATUS:  Joint visit completed with patient, daughter Amy Hess and caregivers Amy Hess and Amy Hess.  Sheryl provided a brief update prior to daughter's arrival.  Increase weakness reported over the last 2 days.  Patient has been in the wheelchair and not ambulating with her walker as she is leaning forward more and is a high risk for falls.  Increase in drooling noted.  Po intake remains very good.   After daughter's arrival, she explains patient did not take an afternoon nap yesterday due to utility work being completed in the home.  She feels patient's weakness is related to loss of sleep and change in routine.   Constipation:  daughter is following bowel regimen as directed by Amy Leatherwood, NP.   No issues with constipation are reported at this time.  Last BM was today.  Weight Loss:  No recent weights since last visit by [facility] NP.  Caregivers report patient is eating very well.  She is consuming at least 3 supplement drinks per day.  Typically eating breakfast and dinner.  Occasionally will skip lunch but is drinking a supplement drink during this time.  Left MAC 22 cm which is an increase of 2 cm since measurements were obtained in August of 2022.   Patient is frail and thin appearing.   Hallucinations:  Patient continues with visual hallucinations although not as much per caregivers.  Today she is seeing a boy  sitting on her porch and waving to him through the kitchen window.   Caregivers note patient mostly sees babies.   Hallucinations are not disturbingly to patient.   Fall Risk: Caregivers typically ambulate patient with a walker but have been using a wheelchair today.  They are utilizing a gait belt to assist with safe tranfers and mobility.   Dysphagia:  Discussed risk for aspiration.  Caregivers note frequent coughing with patient but random.  Excessive drooling over the last 1-2 days.  They are monitoring patient while she eats.  Already have thickener in the home should patient require this.    HISTORY OF PRESENT ILLNESS:  Amy Hess is a 87 y.o. year old female  with multiple medical problems including,  Lewy bodies dementia, trigeminal neuralgia, HTN, RA, hypothyroidism, anemia, and h/o tongue cancer.  Patient is being followed by Palliative Care every 8-12 weeks and PRN.  CODE STATUS: DNR per NP notes.  No form in Vynca. ADVANCED DIRECTIVES: Yes MOST FORM: No PPS: 30%   PHYSICAL EXAM:   VITALS: Today's Vitals   12/08/20 1623  BP: (!) 162/80  Temp: (!) 97.1 F (36.2 C)  SpO2: 95%    LUNGS:  clear to auscultation  CARDIAC: Cor irreg, irreg RRR}  EXTREMITIES: - for edema SKIN: Skin color, texture, turgor normal. No rashes or lesions or normal  NEURO: positive for gait problems and memory problems       Amy Burton, RN

## 2021-01-04 ENCOUNTER — Telehealth: Payer: Self-pay | Admitting: Nurse Practitioner

## 2021-01-04 NOTE — Telephone Encounter (Signed)
Attempted to contact patient's daughter [name], to offer to schedule a Palliative f/u visit with NP in Feb. 2023 (per request of Palliative RN), no answer - left message requesting a return call to schedule visit.

## 2021-01-05 ENCOUNTER — Telehealth: Payer: Self-pay

## 2021-01-05 NOTE — Telephone Encounter (Signed)
Late Entry for December 12.  Phone call made to daughter [name] to re-schedule home visit with Christin Gusler, NP.  [name] reports no changes in patient's condition since my last visit.  She is agreeable to a follow up visit in February with NP.  Advised to contact Palliative Care if a visit is needed sooner.  Notified Franco Nones with Palliative Admin to reschedule appt.

## 2021-01-06 ENCOUNTER — Telehealth: Payer: Self-pay | Admitting: Nurse Practitioner

## 2021-01-06 NOTE — Telephone Encounter (Signed)
Rec'd return call from daughter [name], and have scheduled a Palliative f/u visit for 03/15/21 @ 3:30 PM with NP

## 2021-03-01 ENCOUNTER — Other Ambulatory Visit: Payer: Self-pay

## 2021-03-01 ENCOUNTER — Other Ambulatory Visit: Payer: Medicare Other

## 2021-03-01 VITALS — BP 140/84 | HR 67 | Temp 97.3°F | Resp 18

## 2021-03-01 DIAGNOSIS — Z515 Encounter for palliative care: Secondary | ICD-10-CM

## 2021-03-01 NOTE — Progress Notes (Signed)
PATIENT NAME: Amy Hess DOB: Sep 22, 1928 MRN: [id number]  PRIMARY CARE PROVIDER: Kirk Ruths, MD  RESPONSIBLE PARTY:  Acct ID - Guarantor Home Phone Work Phone Relationship Acct Type  [id number] - STUBBLEFIEL* [phone]  Self P/F     [address], [place], [place] [address]    PLAN OF CARE and INTERVENTIONS:               1.  GOALS OF CARE/ ADVANCE CARE PLANNING:  Remain home with private caregivers.  Avoid hospitalizations.               2.  PATIENT/CAREGIVER EDUCATION:  Aspiration               4. PERSONAL EMERGENCY PLAN:  Activate 911 for emergencies.               5.  DISEASE STATUS:  Dysphagia:  Private caregiver [name] voiced concerns that patient was coughing with thin liquids.  She has started thickening liquids to a nectar-thick consistency.  Patient is able to take her ensure without any problems. Education provided on aspiration.   Labs:  New orders obtained from Dr. Frazier Richards.  CMP, CBC, T4, T3 and TSH.  Explained procedure to patient and caregiver.  Left AC accessed without difficulty.  Blood work taken to [facility] on [address]  Results to be faxed to Dr. Tonette Bihari office.  Patient will have a virtual visit with PCP next Wednesday.   Mobility:  Patient is utilizing a wheelchair more often with her daytime caregiver.  Daughter [name] reports evening caregiver is still able to ambulate a the patient short distances with a rolling walker.  No falls are reported.   Weights:  Last weight was 102 lbs in October.  Mid-arm circumference is 20.32 cm for today. This represents a 2 cm decrease since November.  Patient is thin and frail in appearance.  Temporal wasting present.   Connected with JJ-daughter by phone to advised of my visit.       HISTORY OF PRESENT ILLNESS:  Amy Hess is a 88 y.o. year old female  with multiple medical problems including,  Lewy bodies dementia, trigeminal neuralgia, HTN, RA, hypothyroidism, anemia, and h/o  tongue cancer.  Patient is being followed by Palliative Care every 8-12 weeks and PRN  CODE STATUS:  Full ADVANCED DIRECTIVES: Yes MOST FORM: No PPS: 40%   PHYSICAL EXAM:   VITALS: Today's Vitals   03/01/21 0926  BP: 140/84  Pulse: 67  Resp: 18  Temp: (!) 97.3 F (36.3 C)  SpO2: 97%  PainSc: 0-No pain    LUNGS: clear to auscultation  CARDIAC: Cor irreg, irreg RRR}  EXTREMITIES: - for edema SKIN: Skin color, texture, turgor normal. No rashes or lesions or mobility and turgor normal  NEURO: positive for gait problems and memory problems       Lorenza Burton, RN

## 2021-03-08 ENCOUNTER — Telehealth: Payer: Self-pay

## 2021-03-08 NOTE — Telephone Encounter (Signed)
410 pm.  Incoming call from JJ-daughter.  She advised that lab results have not been received by Dr. Tonette Bihari office.  JJ provided another fax # of [phone] to fax results to.  Advised results would not display in mychart since [facility] processed blood work.  I will fax results to PCP office now.  Daughter asked about abnormal results.  Provided TSH, Protein and Sodium levels to daughter.  Advised daughter to call back if PCP does not receive results as they are scheduled for a virtual visit tomorrow at 415 pm.   Confirmation received that lab results were sent successfully.

## 2021-03-15 ENCOUNTER — Telehealth: Payer: Self-pay | Admitting: Nurse Practitioner

## 2021-03-15 ENCOUNTER — Other Ambulatory Visit: Payer: Medicare Other | Admitting: Nurse Practitioner

## 2021-03-15 NOTE — Telephone Encounter (Signed)
I called JJ, Ms. Leming as a return call, message left, JJ returned call, shared Ms. Theil has passed. Notified PC team to d/c. Offered condolences with option for bereavement services through [facility],

## 2021-03-16 ENCOUNTER — Telehealth: Payer: Self-pay

## 2021-03-16 NOTE — Telephone Encounter (Signed)
Late Entry for 03/11/2021 at 543 pm.  Incoming call from daughter [name].  She is currently out of town and her Charity fundraiser and spouse are with patient.  They believe patient has expired.  JJ is uncertain of the next steps.  Patient is currently a DNR and did not have hospice services in place.  I have advised her to activate 911 so patient can be pronounced by a medical personnel.  JJ will contact 911 for additional support.  Message left for Christin Gusler, NP with update on above.

## 2021-03-23 DEATH — deceased
